# Patient Record
Sex: Female | Born: 1945
Health system: Southern US, Community
[De-identification: ages and names within clinical notes are randomized; demographics above are authoritative.]

## PROBLEM LIST (undated history)

## (undated) DIAGNOSIS — G4730 Sleep apnea, unspecified: Secondary | ICD-10-CM

## (undated) DIAGNOSIS — I10 Essential (primary) hypertension: Secondary | ICD-10-CM

## (undated) DIAGNOSIS — M722 Plantar fascial fibromatosis: Secondary | ICD-10-CM

## (undated) DIAGNOSIS — N390 Urinary tract infection, site not specified: Secondary | ICD-10-CM

## (undated) DIAGNOSIS — K219 Gastro-esophageal reflux disease without esophagitis: Secondary | ICD-10-CM

## (undated) DIAGNOSIS — E785 Hyperlipidemia, unspecified: Secondary | ICD-10-CM

## (undated) HISTORY — DX: Hyperlipidemia, unspecified: E78.5

## (undated) HISTORY — PX: ABDOMINAL HYSTERECTOMY: SHX81

## (undated) HISTORY — DX: Urinary tract infection, site not specified: N39.0

## (undated) HISTORY — DX: Sleep apnea, unspecified: G47.30

## (undated) HISTORY — DX: Plantar fascial fibromatosis: M72.2

---

## 2003-12-25 HISTORY — PX: HAND SURGERY: SHX662

## 2004-12-15 ENCOUNTER — Ambulatory Visit: Payer: Self-pay | Admitting: Unknown Physician Specialty

## 2005-01-22 ENCOUNTER — Ambulatory Visit: Payer: Self-pay | Admitting: Family Medicine

## 2005-10-28 ENCOUNTER — Emergency Department: Payer: Self-pay | Admitting: General Practice

## 2005-12-02 ENCOUNTER — Ambulatory Visit: Payer: Self-pay | Admitting: Internal Medicine

## 2006-03-28 ENCOUNTER — Ambulatory Visit: Payer: Self-pay | Admitting: Internal Medicine

## 2006-04-01 ENCOUNTER — Ambulatory Visit: Payer: Self-pay | Admitting: Internal Medicine

## 2006-12-02 ENCOUNTER — Ambulatory Visit: Payer: Self-pay | Admitting: Internal Medicine

## 2007-09-18 ENCOUNTER — Other Ambulatory Visit: Payer: Self-pay

## 2007-09-18 ENCOUNTER — Emergency Department: Payer: Self-pay | Admitting: Emergency Medicine

## 2007-09-26 ENCOUNTER — Ambulatory Visit: Payer: Self-pay | Admitting: Internal Medicine

## 2007-10-27 ENCOUNTER — Ambulatory Visit: Payer: Self-pay | Admitting: Internal Medicine

## 2007-12-22 ENCOUNTER — Ambulatory Visit: Payer: Self-pay | Admitting: Internal Medicine

## 2008-01-06 ENCOUNTER — Ambulatory Visit: Payer: Self-pay | Admitting: Internal Medicine

## 2008-11-22 ENCOUNTER — Ambulatory Visit: Payer: Self-pay | Admitting: Internal Medicine

## 2008-11-29 ENCOUNTER — Ambulatory Visit: Payer: Self-pay | Admitting: Internal Medicine

## 2008-12-13 ENCOUNTER — Ambulatory Visit: Payer: Self-pay | Admitting: Internal Medicine

## 2009-01-10 ENCOUNTER — Ambulatory Visit: Payer: Self-pay | Admitting: Unknown Physician Specialty

## 2009-03-07 ENCOUNTER — Ambulatory Visit: Payer: Self-pay | Admitting: Urology

## 2009-09-05 ENCOUNTER — Ambulatory Visit: Payer: Self-pay | Admitting: Urology

## 2009-11-28 ENCOUNTER — Ambulatory Visit: Payer: Self-pay | Admitting: Internal Medicine

## 2010-05-15 ENCOUNTER — Ambulatory Visit: Payer: Self-pay | Admitting: Urology

## 2010-06-08 ENCOUNTER — Ambulatory Visit: Payer: Self-pay | Admitting: Urology

## 2010-07-03 ENCOUNTER — Ambulatory Visit: Payer: Self-pay | Admitting: Urology

## 2010-12-21 ENCOUNTER — Ambulatory Visit: Payer: Self-pay | Admitting: Internal Medicine

## 2011-02-05 ENCOUNTER — Ambulatory Visit: Payer: Self-pay | Admitting: Urology

## 2011-04-27 ENCOUNTER — Emergency Department: Payer: Self-pay | Admitting: Emergency Medicine

## 2012-01-21 ENCOUNTER — Ambulatory Visit: Payer: Self-pay | Admitting: Internal Medicine

## 2012-01-23 ENCOUNTER — Ambulatory Visit: Payer: Self-pay | Admitting: Internal Medicine

## 2012-08-18 ENCOUNTER — Ambulatory Visit: Payer: Self-pay | Admitting: Urology

## 2012-08-18 DIAGNOSIS — N2889 Other specified disorders of kidney and ureter: Secondary | ICD-10-CM | POA: Insufficient documentation

## 2012-08-18 DIAGNOSIS — G609 Hereditary and idiopathic neuropathy, unspecified: Secondary | ICD-10-CM | POA: Insufficient documentation

## 2013-01-26 ENCOUNTER — Ambulatory Visit: Payer: Self-pay | Admitting: Internal Medicine

## 2013-08-31 DIAGNOSIS — N281 Cyst of kidney, acquired: Secondary | ICD-10-CM | POA: Insufficient documentation

## 2013-11-01 ENCOUNTER — Emergency Department: Payer: Self-pay | Admitting: Emergency Medicine

## 2014-02-08 ENCOUNTER — Ambulatory Visit: Payer: Self-pay | Admitting: Internal Medicine

## 2014-07-15 ENCOUNTER — Emergency Department: Payer: Self-pay | Admitting: Emergency Medicine

## 2014-08-23 ENCOUNTER — Ambulatory Visit: Payer: Self-pay | Admitting: Internal Medicine

## 2014-09-20 DIAGNOSIS — N819 Female genital prolapse, unspecified: Secondary | ICD-10-CM | POA: Insufficient documentation

## 2014-09-20 DIAGNOSIS — N952 Postmenopausal atrophic vaginitis: Secondary | ICD-10-CM | POA: Insufficient documentation

## 2014-10-04 ENCOUNTER — Emergency Department: Payer: Self-pay | Admitting: Emergency Medicine

## 2014-12-06 ENCOUNTER — Ambulatory Visit: Payer: Self-pay | Admitting: Physician Assistant

## 2015-12-12 ENCOUNTER — Encounter
Admission: RE | Disposition: A | Discharge status: Home / Self Care | Payer: Self-pay | Source: Ambulatory Visit | Attending: Unknown Physician Specialty

## 2015-12-12 ENCOUNTER — Ambulatory Visit
Admission: RE | Admit: 2015-12-12 | Discharge: 2015-12-12 | Disposition: A | Discharge status: Home / Self Care | Payer: Commercial Managed Care - HMO | Source: Ambulatory Visit | Attending: Unknown Physician Specialty | Admitting: Unknown Physician Specialty

## 2015-12-12 ENCOUNTER — Ambulatory Visit: Payer: Commercial Managed Care - HMO | Admitting: Anesthesiology

## 2015-12-12 ENCOUNTER — Encounter: Payer: Self-pay | Admitting: *Deleted

## 2015-12-12 DIAGNOSIS — K640 First degree hemorrhoids: Secondary | ICD-10-CM | POA: Diagnosis not present

## 2015-12-12 DIAGNOSIS — Z1211 Encounter for screening for malignant neoplasm of colon: Secondary | ICD-10-CM | POA: Diagnosis present

## 2015-12-12 DIAGNOSIS — K219 Gastro-esophageal reflux disease without esophagitis: Secondary | ICD-10-CM | POA: Insufficient documentation

## 2015-12-12 DIAGNOSIS — Z79899 Other long term (current) drug therapy: Secondary | ICD-10-CM | POA: Diagnosis not present

## 2015-12-12 DIAGNOSIS — Z7982 Long term (current) use of aspirin: Secondary | ICD-10-CM | POA: Diagnosis not present

## 2015-12-12 DIAGNOSIS — Z800 Family history of malignant neoplasm of digestive organs: Secondary | ICD-10-CM | POA: Insufficient documentation

## 2015-12-12 DIAGNOSIS — I10 Essential (primary) hypertension: Secondary | ICD-10-CM | POA: Diagnosis not present

## 2015-12-12 HISTORY — DX: Essential (primary) hypertension: I10

## 2015-12-12 HISTORY — PX: COLONOSCOPY WITH PROPOFOL: SHX5780

## 2015-12-12 HISTORY — DX: Gastro-esophageal reflux disease without esophagitis: K21.9

## 2015-12-12 SURGERY — COLONOSCOPY WITH PROPOFOL
Anesthesia: General

## 2015-12-12 MED ORDER — PROPOFOL 10 MG/ML IV BOLUS
INTRAVENOUS | Status: DC | PRN
  Administered 2015-12-12: 30 mg via INTRAVENOUS

## 2015-12-12 MED ORDER — FENTANYL CITRATE (PF) 100 MCG/2ML IJ SOLN
INTRAMUSCULAR | Status: DC | PRN
  Administered 2015-12-12: 50 ug via INTRAVENOUS

## 2015-12-12 MED ORDER — SODIUM CHLORIDE 0.9 % IV SOLN: INTRAVENOUS | Status: DC

## 2015-12-12 MED ORDER — PROPOFOL 500 MG/50ML IV EMUL
INTRAVENOUS | Status: DC | PRN
  Administered 2015-12-12: 80 ug/kg/min via INTRAVENOUS

## 2015-12-12 MED ORDER — LIDOCAINE HCL (CARDIAC) 20 MG/ML IV SOLN
INTRAVENOUS | Status: DC | PRN
  Administered 2015-12-12: 50 mg via INTRAVENOUS

## 2015-12-12 MED ORDER — SODIUM CHLORIDE 0.9 % IV SOLN
INTRAVENOUS | Status: DC
  Administered 2015-12-12 (×2): via INTRAVENOUS

## 2015-12-12 MED ORDER — MIDAZOLAM HCL 2 MG/2ML IJ SOLN
INTRAMUSCULAR | Status: DC | PRN
  Administered 2015-12-12: 1 mg via INTRAVENOUS

## 2015-12-12 NOTE — Op Note (Signed)
[facility] Gastroenterology Patient Name: Cynthia Keith Procedure Date: 12/12/2015 9:50 AM MRN: [id number] Account #: [id number] Date of Birth: February 17, 1946 Admit Type: Outpatient Age: 69 Room: [facility] ENDO ROOM 1 Gender: Female Note Status: Finalized Procedure:         Colonoscopy Indications:       Screening in patient at increased risk: Family history of                     1st-degree relative with colorectal cancer Providers:         Manya Silvas, MD Referring MD:      Leighton Ruff. Turner, RN (Referring MD) Medicines:         Propofol per Anesthesia Complications:     No immediate complications. Procedure:         Pre-Anesthesia Assessment:                    - After reviewing the risks and benefits, the patient was                     deemed in satisfactory condition to undergo the procedure.                    After obtaining informed consent, the colonoscope was                     passed under direct vision. Throughout the procedure, the                     patient's blood pressure, pulse, and oxygen saturations                     were monitored continuously. The Colonoscope was                     introduced through the anus and advanced to the the cecum,                     identified by appendiceal orifice and ileocecal valve. The                     colonoscopy was performed without difficulty. The patient                     tolerated the procedure well. The quality of the bowel                     preparation was excellent. Findings:      Internal hemorrhoids were found during endoscopy. The hemorrhoids were       small and Grade I (internal hemorrhoids that do not prolapse).      The exam was otherwise without abnormality. Impression:        - Internal hemorrhoids.                    - The examination was otherwise normal.                    - No specimens collected. Recommendation:    - Repeat colonoscopy in 5 years for screening purposes. Manya Silvas, MD 12/12/2015 10:09:51 AM This report has been signed electronically. Number of Addenda: 0 Note Initiated On: 12/12/2015 9:50 AM Scope Withdrawal Time: 0 hours 7 minutes 21 seconds  Total Procedure Duration: 0 hours  13 minutes 5 seconds       [facility]

## 2015-12-12 NOTE — H&P (Signed)
   Primary Care Physician:  Christie Nottingham., PA Primary Gastroenterologist:  Dr. Vira Agar  Pre-Procedure History & Physical: HPI:  Cynthia Keith is a 69 y.o. female is here for an colonoscopy.   Past Medical History  Diagnosis Date  . GERD (gastroesophageal reflux disease)   . Hypertension     History reviewed. No pertinent past surgical history.  Prior to Admission medications   Medication Sig Start Date End Date Taking? Authorizing Provider  alendronate (FOSAMAX) 70 MG tablet Take 70 mg by mouth once a week. Take with a full glass of water on an empty stomach.   Yes Historical Provider, MD  amLODipine (NORVASC) 5 MG tablet Take 5 mg by mouth daily.   Yes Historical Provider, MD  aspirin EC 81 MG tablet Take 81 mg by mouth daily.   Yes Historical Provider, MD  Multiple Vitamin (MULTIVITAMIN) tablet Take 1 tablet by mouth daily.   Yes Historical Provider, MD  omeprazole (PRILOSEC) 20 MG capsule Take 20 mg by mouth daily.   Yes Historical Provider, MD    Allergies as of 11/15/2015  . (Not on File)    History reviewed. No pertinent family history.  Social History   Social History  . Marital Status: Married    Spouse Name: N/A  . Number of Children: N/A  . Years of Education: N/A   Occupational History  . Not on file.   Social History Main Topics  . Smoking status: Never Smoker   . Smokeless tobacco: Never Used  . Alcohol Use: Not on file  . Drug Use: No  . Sexual Activity: Not on file   Other Topics Concern  . Not on file   Social History Narrative  . No narrative on file    Review of Systems: See HPI, otherwise negative ROS  Physical Exam: BP 124/64 mmHg  Pulse 58  Temp(Src) 97.1 F (36.2 C) (Tympanic)  Resp 18  Ht 4\' 9"  (1.448 m)  Wt 58.968 kg (130 lb)  BMI 28.12 kg/m2  SpO2 100% General:   Alert,  pleasant and cooperative in NAD Head:  Normocephalic and atraumatic. Neck:  Supple; no masses or thyromegaly. Lungs:  Clear throughout to  auscultation.    Heart:  Regular rate and rhythm. Abdomen:  Soft, nontender and nondistended. Normal bowel sounds, without guarding, and without rebound.   Neurologic:  Alert and  oriented x4;  grossly normal neurologically.  Impression/Plan: Cynthia Keith is here for an colonoscopy to be performed for screening, FH colon cancer in brother.  Risks, benefits, limitations, and alternatives regarding  colonoscopy have been reviewed with the patient.  Questions have been answered.  All parties agreeable.   Gaylyn Cheers, MD  12/12/2015, 9:43 AM

## 2015-12-12 NOTE — Anesthesia Preprocedure Evaluation (Signed)
Anesthesia Evaluation  Patient identified by MRN, date of birth, ID band Patient awake    Reviewed: Allergy & Precautions, H&P , NPO status , Patient's Chart, lab work & pertinent test results  History of Anesthesia Complications Negative for: history of anesthetic complications  Airway Mallampati: III  TM Distance: >3 FB Neck ROM: limited    Dental no notable dental hx. (+) Teeth Intact   Pulmonary neg pulmonary ROS, neg shortness of breath,    Pulmonary exam normal breath sounds clear to auscultation       Cardiovascular Exercise Tolerance: Good hypertension, (-) angina(-) Past MI and (-) PND Normal cardiovascular exam Rhythm:regular Rate:Normal     Neuro/Psych negative neurological ROS  negative psych ROS   GI/Hepatic Neg liver ROS, GERD  Controlled,  Endo/Other  negative endocrine ROS  Renal/GU negative Renal ROS  negative genitourinary   Musculoskeletal   Abdominal   Peds  Hematology negative hematology ROS (+)   Anesthesia Other Findings Past Medical History:   GERD (gastroesophageal reflux disease)                       Hypertension                                                History reviewed. No pertinent surgical history.  BMI    Body Mass Index   28.12 kg/m 2      Reproductive/Obstetrics negative OB ROS                             Anesthesia Physical Anesthesia Plan  ASA: III  Anesthesia Plan: General   Post-op Pain Management:    Induction:   Airway Management Planned:   Additional Equipment:   Intra-op Plan:   Post-operative Plan:   Informed Consent: I have reviewed the patients History and Physical, chart, labs and discussed the procedure including the risks, benefits and alternatives for the proposed anesthesia with the patient or authorized representative who has indicated his/her understanding and acceptance.   Dental Advisory Given  Plan  Discussed with: Anesthesiologist, CRNA and Surgeon  Anesthesia Plan Comments:         Anesthesia Quick Evaluation

## 2015-12-12 NOTE — Transfer of Care (Signed)
Immediate Anesthesia Transfer of Care Note  Patient: Cynthia Keith  Procedure(s) Performed: Procedure(s): COLONOSCOPY WITH PROPOFOL (N/A)  Patient Location: PACU  Anesthesia Type:General  Level of Consciousness: awake, alert  and oriented  Airway & Oxygen Therapy: Patient Spontanous Breathing and Patient connected to nasal cannula oxygen  Post-op Assessment: Report given to RN and Post -op Vital signs reviewed and stable  Post vital signs: Reviewed and stable  Last Vitals:  Filed Vitals:   12/12/15 0823  BP: 124/64  Pulse: 58  Temp: 36.2 C  Resp: 18    Complications: No apparent anesthesia complications

## 2015-12-12 NOTE — Anesthesia Postprocedure Evaluation (Signed)
Anesthesia Post Note  Patient: Cynthia Keith  Procedure(s) Performed: Procedure(s) (LRB): COLONOSCOPY WITH PROPOFOL (N/A)  Patient location during evaluation: Endoscopy Anesthesia Type: General Level of consciousness: awake and alert Pain management: pain level controlled Vital Signs Assessment: post-procedure vital signs reviewed and stable Respiratory status: spontaneous breathing, nonlabored ventilation, respiratory function stable and patient connected to nasal cannula oxygen Cardiovascular status: blood pressure returned to baseline and stable Postop Assessment: no signs of nausea or vomiting Anesthetic complications: no    Last Vitals:  Filed Vitals:   12/12/15 1032 12/12/15 1042  BP: 104/44 136/69  Pulse: 47 48  Temp:    Resp: 13 16    Last Pain: There were no vitals filed for this visit.               Precious Haws Tyan Dy

## 2015-12-15 ENCOUNTER — Encounter: Payer: Self-pay | Admitting: Unknown Physician Specialty

## 2016-02-13 ENCOUNTER — Other Ambulatory Visit: Payer: Self-pay | Admitting: Physician Assistant

## 2016-02-13 DIAGNOSIS — Z1231 Encounter for screening mammogram for malignant neoplasm of breast: Secondary | ICD-10-CM

## 2016-02-21 DIAGNOSIS — N816 Rectocele: Secondary | ICD-10-CM | POA: Insufficient documentation

## 2016-02-27 ENCOUNTER — Ambulatory Visit: Payer: Commercial Managed Care - HMO | Attending: Physician Assistant

## 2016-03-05 ENCOUNTER — Other Ambulatory Visit: Payer: Self-pay | Admitting: Physician Assistant

## 2016-03-05 ENCOUNTER — Ambulatory Visit
Admission: RE | Admit: 2016-03-05 | Discharge: 2016-03-05 | Disposition: A | Discharge status: Home / Self Care | Payer: Commercial Managed Care - HMO | Source: Ambulatory Visit | Attending: Physician Assistant | Admitting: Physician Assistant

## 2016-03-05 DIAGNOSIS — Z1231 Encounter for screening mammogram for malignant neoplasm of breast: Secondary | ICD-10-CM | POA: Insufficient documentation

## 2016-03-15 ENCOUNTER — Other Ambulatory Visit: Payer: Self-pay | Admitting: Physician Assistant

## 2016-03-15 DIAGNOSIS — N63 Unspecified lump in unspecified breast: Secondary | ICD-10-CM

## 2016-04-09 ENCOUNTER — Ambulatory Visit: Payer: Commercial Managed Care - HMO

## 2016-04-09 ENCOUNTER — Ambulatory Visit
Admission: RE | Admit: 2016-04-09 | Discharge: 2016-04-09 | Disposition: A | Discharge status: Home / Self Care | Payer: Commercial Managed Care - HMO | Source: Ambulatory Visit | Attending: Physician Assistant | Admitting: Physician Assistant

## 2016-04-09 ENCOUNTER — Other Ambulatory Visit: Payer: Commercial Managed Care - HMO

## 2016-04-09 DIAGNOSIS — N63 Unspecified lump in unspecified breast: Secondary | ICD-10-CM

## 2016-05-06 ENCOUNTER — Emergency Department
Admission: EM | Admit: 2016-05-06 | Discharge: 2016-05-06 | Disposition: A | Discharge status: Home / Self Care | Payer: Commercial Managed Care - HMO | Attending: Emergency Medicine | Admitting: Emergency Medicine

## 2016-05-06 ENCOUNTER — Emergency Department: Payer: Commercial Managed Care - HMO

## 2016-05-06 ENCOUNTER — Encounter: Payer: Self-pay | Admitting: Urgent Care

## 2016-05-06 DIAGNOSIS — Y999 Unspecified external cause status: Secondary | ICD-10-CM | POA: Diagnosis not present

## 2016-05-06 DIAGNOSIS — Y939 Activity, unspecified: Secondary | ICD-10-CM | POA: Diagnosis not present

## 2016-05-06 DIAGNOSIS — S39012A Strain of muscle, fascia and tendon of lower back, initial encounter: Secondary | ICD-10-CM | POA: Insufficient documentation

## 2016-05-06 DIAGNOSIS — Y929 Unspecified place or not applicable: Secondary | ICD-10-CM | POA: Insufficient documentation

## 2016-05-06 DIAGNOSIS — X58XXXA Exposure to other specified factors, initial encounter: Secondary | ICD-10-CM | POA: Insufficient documentation

## 2016-05-06 DIAGNOSIS — Z79899 Other long term (current) drug therapy: Secondary | ICD-10-CM | POA: Insufficient documentation

## 2016-05-06 DIAGNOSIS — M25551 Pain in right hip: Secondary | ICD-10-CM | POA: Diagnosis present

## 2016-05-06 DIAGNOSIS — I10 Essential (primary) hypertension: Secondary | ICD-10-CM | POA: Diagnosis not present

## 2016-05-06 DIAGNOSIS — M5431 Sciatica, right side: Secondary | ICD-10-CM

## 2016-05-06 MED ORDER — PREDNISONE 20 MG PO TABS
40.0000 mg | ORAL_TABLET | Freq: Once | ORAL | Status: AC
  Administered 2016-05-06: 40 mg via ORAL
  Filled 2016-05-06: qty 2

## 2016-05-06 MED ORDER — PREDNISONE 20 MG PO TABS: 40.0000 mg | ORAL_TABLET | Freq: Every day | ORAL | Status: DC

## 2016-05-06 MED ORDER — TRAMADOL HCL 50 MG PO TABS: 50.0000 mg | ORAL_TABLET | Freq: Four times a day (QID) | ORAL | Status: DC | PRN

## 2016-05-06 MED ORDER — TRAMADOL HCL 50 MG PO TABS
100.0000 mg | ORAL_TABLET | Freq: Once | ORAL | Status: AC
  Administered 2016-05-06: 100 mg via ORAL
  Filled 2016-05-06: qty 2

## 2016-05-06 NOTE — ED Notes (Signed)
Pt reports mass was found on right breast last month. Mass has not been biopsied.

## 2016-05-06 NOTE — ED Notes (Signed)
Patient presents with c/o RIGHT hip pain. Patient has been using essential oils on her hip since it began hurting yesterday. Denies injury.

## 2016-05-06 NOTE — Discharge Instructions (Signed)

## 2016-05-06 NOTE — ED Notes (Signed)
MD Paduchowski at bedside  

## 2016-05-06 NOTE — ED Notes (Signed)
Pt reports acute right lower back/hip, radiating down right leg to toes, and across to lower left back. Pain in left back is described as numbness. Pain is sharp/stabbing shooting in right back/leg. Pt has tenderness in lower lumbar/sacral spine. Pt denies tenderness in right hip. Pt has hx of arthritis in lower back. 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   Pt reports his of mass on right kidney. Mass was originally 8 cm, currently down to 1 cm.

## 2016-05-06 NOTE — ED Provider Notes (Signed)
[facility] Emergency Department Provider Note  Time seen: 2:52 AM  I have reviewed the triage vital signs and the nursing notes.   HISTORY  Chief Complaint Hip Pain    HPI Cynthia Keith is a 70 y.o. female with a past medical history of gastric reflux and hypertension presents the emergency department with lower back pain. According to the patient since early this morning (approximately 20 hours ago) the patient has been experiencing lower back pain radiating down her right leg. States it is more in the lower back but then goes into the right hip and down the right leg. Patient states she has had sciatica in the past. Does not recall any inciting event. Denies any trauma. Describes the pain as moderate, unrelieved by Tylenol at home so she came to the emergency department.     Past Medical History  Diagnosis Date  . GERD (gastroesophageal reflux disease)   . Hypertension     There are no active problems to display for this patient.   Past Surgical History  Procedure Laterality Date  . Colonoscopy with propofol N/A 12/12/2015    Procedure: COLONOSCOPY WITH PROPOFOL;  Surgeon: Manya Silvas, MD;  Location: [facility] ENDOSCOPY;  Service: Endoscopy;  Laterality: N/A;    Current Outpatient Rx  Name  Route  Sig  Dispense  Refill  . alendronate (FOSAMAX) 70 MG tablet   Oral   Take 70 mg by mouth once a week. Take with a full glass of water on an empty stomach.         Marland Kitchen amLODipine (NORVASC) 5 MG tablet   Oral   Take 5 mg by mouth daily.         Marland Kitchen aspirin EC 81 MG tablet   Oral   Take 81 mg by mouth daily.         . Multiple Vitamin (MULTIVITAMIN) tablet   Oral   Take 1 tablet by mouth daily.         Marland Kitchen omeprazole (PRILOSEC) 20 MG capsule   Oral   Take 20 mg by mouth daily.           Allergies Iodine  Family History  Problem Relation Age of Onset  . Breast cancer Cousin     2 maternal cousins    Social History Social History   Substance Use Topics  . Smoking status: Never Smoker   . Smokeless tobacco: Never Used  . Alcohol Use: Yes    Review of Systems Constitutional: Negative for fever. Cardiovascular: Negative for chest pain. Respiratory: Negative for shortness of breath. Gastrointestinal: Negative for abdominal pain Musculoskeletal: Lower back pain radiating down her right leg. Neurological: Negative for headache 10-point ROS otherwise negative.  ____________________________________________   PHYSICAL EXAM:  VITAL SIGNS: ED Triage Vitals  Enc Vitals Group     BP 05/06/16 0210 130/67 mmHg     Pulse Rate 05/06/16 0210 69     Resp 05/06/16 0210 18     Temp 05/06/16 0210 97.8 F (36.6 C)     Temp Source 05/06/16 0210 Oral     SpO2 05/06/16 0210 100 %     Weight 05/06/16 0210 130 lb (58.968 kg)     Height 05/06/16 0210 4' 9.5" (1.461 m)     Head Cir --      Peak Flow --      Pain Score 05/06/16 0211 8     Pain Loc --      Pain Edu? --  Excl. in [place]? --     Constitutional: Alert and oriented. Well appearing and in no distress. Eyes: Normal exam ENT   Head: Normocephalic and atraumatic.   Mouth/Throat: Mucous membranes are moist. Cardiovascular: Normal rate, regular rhythm. No murmur Respiratory: Normal respiratory effort without tachypnea nor retractions. Breath sounds are clear  Gastrointestinal: Soft and nontender. No distention.   Musculoskeletal: Moderate tenderness over the right sacroiliac joint, no hip tenderness, good range of motion in bilateral hips without tenderness. 2+ DP pulse. Neurovascular intact. Neurologic:  Normal speech and language. No gross focal neurologic deficits  Skin:  Skin is warm, dry and intact.  Psychiatric: Mood and affect are normal.   ____________________________________________   RADIOLOGY  X-rays negative  ____________________________________________    INITIAL IMPRESSION / ASSESSMENT AND PLAN / ED COURSE  Pertinent labs & imaging  results that were available during my care of the patient were reviewed by me and considered in my medical decision making (see chart for details).  Patient with symptoms most suggestive of sciatica. We will dose pain medication and check x-rays. Patient agreeable to plan.  X-rays are negative. Suspect lumbosacral strain. We'll discharge with Ultram and PCP follow-up. Patient agreeable to plan. I discussed my normal lower back pain return precautions with the patient.  ____________________________________________   FINAL CLINICAL IMPRESSION(S) / ED DIAGNOSES  Lumbosacral strain   Harvest Dark, MD 05/06/16 [phone]

## 2016-05-06 NOTE — ED Notes (Signed)
  Reviewed d/c instructions, follow-up care, and prescriptions with pt. Pt verbalized understanding 

## 2016-06-13 DIAGNOSIS — R350 Frequency of micturition: Secondary | ICD-10-CM | POA: Insufficient documentation

## 2016-06-13 DIAGNOSIS — R300 Dysuria: Secondary | ICD-10-CM | POA: Insufficient documentation

## 2016-06-19 ENCOUNTER — Other Ambulatory Visit: Payer: Self-pay | Admitting: Physician Assistant

## 2016-06-19 DIAGNOSIS — N63 Unspecified lump in unspecified breast: Secondary | ICD-10-CM

## 2016-10-10 ENCOUNTER — Encounter: Payer: Self-pay | Admitting: *Deleted

## 2016-10-10 ENCOUNTER — Ambulatory Visit (INDEPENDENT_AMBULATORY_CARE_PROVIDER_SITE_OTHER): Payer: Commercial Managed Care - HMO | Admitting: Podiatry

## 2016-10-10 ENCOUNTER — Encounter: Payer: Self-pay | Admitting: Podiatry

## 2016-10-10 ENCOUNTER — Other Ambulatory Visit: Payer: Self-pay | Admitting: *Deleted

## 2016-10-10 DIAGNOSIS — L02619 Cutaneous abscess of unspecified foot: Secondary | ICD-10-CM | POA: Diagnosis not present

## 2016-10-10 DIAGNOSIS — L03119 Cellulitis of unspecified part of limb: Secondary | ICD-10-CM | POA: Diagnosis not present

## 2016-10-10 DIAGNOSIS — Q828 Other specified congenital malformations of skin: Secondary | ICD-10-CM | POA: Diagnosis not present

## 2016-10-10 DIAGNOSIS — M2041 Other hammer toe(s) (acquired), right foot: Secondary | ICD-10-CM | POA: Diagnosis not present

## 2016-10-10 MED ORDER — CEPHALEXIN 500 MG PO CAPS: 500.0000 mg | ORAL_CAPSULE | Freq: Three times a day (TID) | ORAL | 0 refills | Status: DC

## 2016-10-10 NOTE — Progress Notes (Signed)
   Subjective:    Patient ID: Cynthia Keith, female    DOB: 08-19-1946, 70 y.o.   MRN: [id number]  HPI : She presents today with a chief complaint of a painful sore to the medial aspect of the fifth digit of the right foot. She states has been there for several months now she's been utilizing essentially rolls and corn pads to alleviate her symptoms. She states that now seems to be getting worse and is exquisitely tender.    Review of Systems  Skin: Positive for rash.  All other systems reviewed and are negative.      Objective:   Physical Exam: Vital signs are stable she is alert and oriented 3. Pulses are palpable. Neurologic sensorium is intact. Deep tendon reflexes are intact. Muscle strength +5 over 5 dorsiflexion plantar flexors and inverters everters all of his musculature is intact. Orthopedic evaluation of his wrist on dorsiflexion full range of motion with crepitus hammertoe deformities are noted. Adductor varus rotated hammertoe deformity fifth right resulting inject the position and reactive hyperkeratosis to the medial aspect of the PIPJ fifth digit right foot with the use of corn pads and his intraoral she has broken down skin resulting in superficial ulceration which was debrided sharply today to bleeding. She did also has some cellulitis extending to the level of the metatarsophalangeal joint. Radiographs are not taken today due to failure of equipment.        Assessment & Plan:  Superficial ulceration secondary to hammertoe deformity and skin breakdown.  Plan: Resected all of the necrotic tissue today to bleeding applied silver nitrate and addressed a compressive dressing encouraged once a day soaking Epsom salts and warm water and application of a dry sterile dressing. I expressed to her that she should not use the salicylic acid pads for the essential oils until this has healed up. I dispensed a prescription for cephalexin 500 mg 1 by mouth 3 times a day. I will follow-up  with her in a couple weeks. She will watch for signs and symptoms of worsening infection such as fever chills nausea vomiting cellulitis extending to the midfoot. She is to notify the emergency department should any this arise.

## 2016-10-15 ENCOUNTER — Other Ambulatory Visit: Payer: Commercial Managed Care - HMO

## 2016-10-15 ENCOUNTER — Ambulatory Visit: Payer: Commercial Managed Care - HMO

## 2016-10-22 ENCOUNTER — Ambulatory Visit: Payer: Self-pay | Admitting: Podiatry

## 2016-10-29 ENCOUNTER — Ambulatory Visit
Admission: RE | Admit: 2016-10-29 | Discharge: 2016-10-29 | Disposition: A | Discharge status: Home / Self Care | Payer: Commercial Managed Care - HMO | Source: Ambulatory Visit | Attending: Physician Assistant | Admitting: Physician Assistant

## 2016-10-29 DIAGNOSIS — N63 Unspecified lump in unspecified breast: Secondary | ICD-10-CM | POA: Diagnosis not present

## 2017-01-03 DIAGNOSIS — J45991 Cough variant asthma: Secondary | ICD-10-CM | POA: Diagnosis not present

## 2017-01-03 DIAGNOSIS — I10 Essential (primary) hypertension: Secondary | ICD-10-CM | POA: Diagnosis not present

## 2017-01-09 ENCOUNTER — Ambulatory Visit: Payer: Commercial Managed Care - HMO | Admitting: Podiatry

## 2017-01-21 DIAGNOSIS — N816 Rectocele: Secondary | ICD-10-CM | POA: Diagnosis not present

## 2017-01-21 DIAGNOSIS — E782 Mixed hyperlipidemia: Secondary | ICD-10-CM | POA: Diagnosis not present

## 2017-01-21 DIAGNOSIS — E559 Vitamin D deficiency, unspecified: Secondary | ICD-10-CM | POA: Diagnosis not present

## 2017-01-21 DIAGNOSIS — Z0001 Encounter for general adult medical examination with abnormal findings: Secondary | ICD-10-CM | POA: Diagnosis not present

## 2017-01-28 ENCOUNTER — Encounter: Payer: Self-pay | Admitting: Podiatry

## 2017-01-28 ENCOUNTER — Ambulatory Visit (INDEPENDENT_AMBULATORY_CARE_PROVIDER_SITE_OTHER): Payer: Medicare HMO | Admitting: Podiatry

## 2017-01-28 DIAGNOSIS — Q828 Other specified congenital malformations of skin: Secondary | ICD-10-CM

## 2017-01-28 DIAGNOSIS — M2041 Other hammer toe(s) (acquired), right foot: Secondary | ICD-10-CM

## 2017-01-28 DIAGNOSIS — E782 Mixed hyperlipidemia: Secondary | ICD-10-CM | POA: Diagnosis not present

## 2017-01-28 DIAGNOSIS — N390 Urinary tract infection, site not specified: Secondary | ICD-10-CM | POA: Diagnosis not present

## 2017-01-28 DIAGNOSIS — K219 Gastro-esophageal reflux disease without esophagitis: Secondary | ICD-10-CM | POA: Diagnosis not present

## 2017-01-28 DIAGNOSIS — M810 Age-related osteoporosis without current pathological fracture: Secondary | ICD-10-CM | POA: Diagnosis not present

## 2017-01-28 DIAGNOSIS — I10 Essential (primary) hypertension: Secondary | ICD-10-CM | POA: Diagnosis not present

## 2017-01-29 NOTE — Progress Notes (Signed)
She presents today chief complaint of a painful lesion to the medial aspect of the fifth digit of the right foot.  Objective: Vital signs are stable alert and oriented 3. Pulses are palpable. Neurologic sensorium is intact. Dr. varus rotated hammertoe deformity fifth right with osteoarthritic changes have resulted and reactive hyperkeratoses medial aspect of the fifth toe right foot.  Assessment: Pain limp secondary to painful callus.  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 Follow up with her as needed

## 2017-01-30 ENCOUNTER — Other Ambulatory Visit: Payer: Self-pay | Admitting: Nurse Practitioner

## 2017-01-30 DIAGNOSIS — E2839 Other primary ovarian failure: Secondary | ICD-10-CM

## 2017-02-26 ENCOUNTER — Inpatient Hospital Stay
Admission: EM | Admit: 2017-02-26 | Discharge: 2017-02-28 | DRG: 558 | Disposition: A | Discharge status: Home / Self Care | Payer: Medicare HMO | Attending: Internal Medicine | Admitting: Internal Medicine

## 2017-02-26 ENCOUNTER — Encounter: Payer: Self-pay | Admitting: Emergency Medicine

## 2017-02-26 DIAGNOSIS — M79605 Pain in left leg: Secondary | ICD-10-CM | POA: Diagnosis not present

## 2017-02-26 DIAGNOSIS — Z90710 Acquired absence of both cervix and uterus: Secondary | ICD-10-CM

## 2017-02-26 DIAGNOSIS — Z7982 Long term (current) use of aspirin: Secondary | ICD-10-CM | POA: Diagnosis not present

## 2017-02-26 DIAGNOSIS — I10 Essential (primary) hypertension: Secondary | ICD-10-CM

## 2017-02-26 DIAGNOSIS — M62838 Other muscle spasm: Secondary | ICD-10-CM

## 2017-02-26 DIAGNOSIS — Z79899 Other long term (current) drug therapy: Secondary | ICD-10-CM

## 2017-02-26 DIAGNOSIS — R739 Hyperglycemia, unspecified: Secondary | ICD-10-CM | POA: Diagnosis not present

## 2017-02-26 DIAGNOSIS — D72829 Elevated white blood cell count, unspecified: Secondary | ICD-10-CM | POA: Diagnosis not present

## 2017-02-26 DIAGNOSIS — Z888 Allergy status to other drugs, medicaments and biological substances status: Secondary | ICD-10-CM | POA: Diagnosis not present

## 2017-02-26 DIAGNOSIS — K219 Gastro-esophageal reflux disease without esophagitis: Secondary | ICD-10-CM | POA: Diagnosis present

## 2017-02-26 DIAGNOSIS — Z803 Family history of malignant neoplasm of breast: Secondary | ICD-10-CM

## 2017-02-26 DIAGNOSIS — M6282 Rhabdomyolysis: Principal | ICD-10-CM | POA: Diagnosis present

## 2017-02-26 DIAGNOSIS — M79604 Pain in right leg: Secondary | ICD-10-CM

## 2017-02-26 DIAGNOSIS — I159 Secondary hypertension, unspecified: Secondary | ICD-10-CM

## 2017-02-26 HISTORY — DX: Essential (primary) hypertension: I10

## 2017-02-26 LAB — BASIC METABOLIC PANEL
Anion gap: 7 (ref 5–15)
BUN: 10 mg/dL (ref 6–20)
CHLORIDE: 103 mmol/L (ref 101–111)
CO2: 27 mmol/L (ref 22–32)
Calcium: 9.2 mg/dL (ref 8.9–10.3)
Creatinine, Ser: 0.42 mg/dL — ABNORMAL LOW (ref 0.44–1.00)
GFR calc Af Amer: >60 mL/min (ref >60)
GFR calc non Af Amer: >60 mL/min (ref >60)
GLUCOSE: 129 mg/dL — AB (ref 65–99)
POTASSIUM: 3.5 mmol/L (ref 3.5–5.1)
Sodium: 137 mmol/L (ref 135–145)

## 2017-02-26 LAB — URINE DRUG SCREEN, QUALITATIVE (ARMC ONLY)
Amphetamines, Ur Screen: NOT DETECTED
BARBITURATES, UR SCREEN: NOT DETECTED
BENZODIAZEPINE, UR SCRN: NOT DETECTED
COCAINE METABOLITE, UR ~~LOC~~: NOT DETECTED
Cannabinoid 50 Ng, Ur ~~LOC~~: NOT DETECTED
MDMA (Ecstasy)Ur Screen: NOT DETECTED
Methadone Scn, Ur: NOT DETECTED
OPIATE, UR SCREEN: NOT DETECTED
PHENCYCLIDINE (PCP) UR S: NOT DETECTED
Tricyclic, Ur Screen: NOT DETECTED

## 2017-02-26 LAB — CBC
HEMATOCRIT: 42.4 % (ref 35.0–47.0)
HEMOGLOBIN: 14.9 g/dL (ref 12.0–16.0)
MCH: 32.4 pg (ref 26.0–34.0)
MCHC: 35.2 g/dL (ref 32.0–36.0)
MCV: 92.2 fL (ref 80.0–100.0)
Platelets: 245 10*3/uL (ref 150–440)
RBC: 4.6 MIL/uL (ref 3.80–5.20)
RDW: 13.4 % (ref 11.5–14.5)
WBC: 11.3 10*3/uL — ABNORMAL HIGH (ref 3.6–11.0)

## 2017-02-26 LAB — TROPONIN I: Troponin I: <0.03 ng/mL (ref <0.03)

## 2017-02-26 LAB — CK: CK TOTAL: 22015 U/L — AB (ref 38–234)

## 2017-02-26 LAB — TSH: TSH: 1.387 u[IU]/mL (ref 0.350–4.500)

## 2017-02-26 MED ORDER — OXYCODONE-ACETAMINOPHEN 5-325 MG PO TABS
1.0000 | ORAL_TABLET | Freq: Once | ORAL | Status: AC
  Administered 2017-02-26: 1 via ORAL
  Filled 2017-02-26: qty 1

## 2017-02-26 MED ORDER — IBUPROFEN 200 MG PO TABS
200.0000 mg | ORAL_TABLET | Freq: Four times a day (QID) | ORAL | Status: DC | PRN
  Administered 2017-02-26 – 2017-02-28 (×2): 200 mg via ORAL
  Filled 2017-02-26 (×2): qty 1

## 2017-02-26 MED ORDER — FENTANYL CITRATE (PF) 100 MCG/2ML IJ SOLN
75.0000 ug | Freq: Once | INTRAMUSCULAR | Status: AC
  Administered 2017-02-26: 75 ug via INTRAVENOUS
  Filled 2017-02-26: qty 2

## 2017-02-26 MED ORDER — ACETAMINOPHEN 325 MG PO TABS
650.0000 mg | ORAL_TABLET | Freq: Once | ORAL | Status: AC
  Administered 2017-02-26: 650 mg via ORAL
  Filled 2017-02-26: qty 2

## 2017-02-26 MED ORDER — DIAZEPAM 2 MG PO TABS
2.0000 mg | ORAL_TABLET | Freq: Once | ORAL | Status: AC
  Administered 2017-02-26: 2 mg via ORAL
  Filled 2017-02-26: qty 1

## 2017-02-26 MED ORDER — ENOXAPARIN SODIUM 40 MG/0.4ML ~~LOC~~ SOLN
40.0000 mg | SUBCUTANEOUS | Status: DC
  Administered 2017-02-26 – 2017-02-27 (×2): 40 mg via SUBCUTANEOUS
  Filled 2017-02-26 (×2): qty 0.4

## 2017-02-26 MED ORDER — AMLODIPINE BESYLATE 5 MG PO TABS
5.0000 mg | ORAL_TABLET | Freq: Once | ORAL | Status: AC
  Administered 2017-02-26: 5 mg via ORAL
  Filled 2017-02-26: qty 1

## 2017-02-26 MED ORDER — AMLODIPINE BESYLATE 10 MG PO TABS
10.0000 mg | ORAL_TABLET | Freq: Every day | ORAL | Status: DC
  Administered 2017-02-27 – 2017-02-28 (×2): 10 mg via ORAL
  Filled 2017-02-26 (×2): qty 1

## 2017-02-26 MED ORDER — SODIUM CHLORIDE 0.45 % IV SOLN
INTRAVENOUS | Status: DC
  Administered 2017-02-26: 14:00:00 via INTRAVENOUS

## 2017-02-26 MED ORDER — CLONIDINE HCL 0.1 MG PO TABS: 0.1000 mg | ORAL_TABLET | Freq: Four times a day (QID) | ORAL | Status: DC | PRN

## 2017-02-26 MED ORDER — ONDANSETRON HCL 4 MG PO TABS
4.0000 mg | ORAL_TABLET | Freq: Four times a day (QID) | ORAL | Status: DC | PRN
  Administered 2017-02-26: 4 mg via ORAL
  Filled 2017-02-26: qty 1

## 2017-02-26 MED ORDER — AMLODIPINE BESYLATE 5 MG PO TABS
5.0000 mg | ORAL_TABLET | Freq: Every day | ORAL | Status: DC
  Administered 2017-02-26: 5 mg via ORAL
  Filled 2017-02-26: qty 1

## 2017-02-26 MED ORDER — ACETAMINOPHEN 325 MG PO TABS
650.0000 mg | ORAL_TABLET | Freq: Four times a day (QID) | ORAL | Status: DC | PRN
  Administered 2017-02-26 – 2017-02-27 (×4): 650 mg via ORAL
  Filled 2017-02-26 (×4): qty 2

## 2017-02-26 MED ORDER — SODIUM CHLORIDE 0.9 % IV BOLUS (SEPSIS)
1000.0000 mL | Freq: Once | INTRAVENOUS | Status: AC
  Administered 2017-02-26: 1000 mL via INTRAVENOUS

## 2017-02-26 MED ORDER — ONDANSETRON HCL 4 MG/2ML IJ SOLN: 4.0000 mg | Freq: Four times a day (QID) | INTRAMUSCULAR | Status: DC | PRN

## 2017-02-26 MED ORDER — CARISOPRODOL 350 MG PO TABS: 350.0000 mg | ORAL_TABLET | Freq: Three times a day (TID) | ORAL | 0 refills | Status: DC | PRN

## 2017-02-26 MED ORDER — ASPIRIN EC 81 MG PO TBEC
81.0000 mg | DELAYED_RELEASE_TABLET | Freq: Every day | ORAL | Status: DC
  Administered 2017-02-26 – 2017-02-28 (×3): 81 mg via ORAL
  Filled 2017-02-26 (×3): qty 1

## 2017-02-26 MED ORDER — KETOROLAC TROMETHAMINE 30 MG/ML IJ SOLN
30.0000 mg | Freq: Once | INTRAMUSCULAR | Status: AC
  Administered 2017-02-26: 30 mg via INTRAMUSCULAR

## 2017-02-26 MED ORDER — PANTOPRAZOLE SODIUM 40 MG PO TBEC
40.0000 mg | DELAYED_RELEASE_TABLET | Freq: Every day | ORAL | Status: DC
  Administered 2017-02-26 – 2017-02-28 (×3): 40 mg via ORAL
  Filled 2017-02-26 (×3): qty 1

## 2017-02-26 MED ORDER — ACETAMINOPHEN 650 MG RE SUPP: 650.0000 mg | Freq: Four times a day (QID) | RECTAL | Status: DC | PRN

## 2017-02-26 MED ORDER — HYDRALAZINE HCL 20 MG/ML IJ SOLN
10.0000 mg | Freq: Once | INTRAMUSCULAR | Status: AC
  Administered 2017-02-26: 10 mg via INTRAVENOUS
  Filled 2017-02-26: qty 1

## 2017-02-26 MED ORDER — DICLOFENAC SODIUM 3 % TD GEL: 1.0000 "application " | Freq: Two times a day (BID) | TRANSDERMAL | 0 refills | Status: DC | PRN

## 2017-02-26 MED ORDER — KETOROLAC TROMETHAMINE 30 MG/ML IJ SOLN
INTRAMUSCULAR | Status: AC
  Administered 2017-02-26: 30 mg via INTRAMUSCULAR
  Filled 2017-02-26: qty 1

## 2017-02-26 MED ORDER — SODIUM CHLORIDE 0.9% FLUSH
3.0000 mL | Freq: Two times a day (BID) | INTRAVENOUS | Status: DC
  Administered 2017-02-26 – 2017-02-27 (×2): 3 mL via INTRAVENOUS

## 2017-02-26 NOTE — ED Provider Notes (Signed)
This patient was singed out to me by Dr. Clearnce Hasten.  19y female with a new exercise regimen, including treadmill and stairmaster, presenting with bilateral thigh "burning" resulting in difficulty walking. The patient has received 2 mg of Valium and 30 mg of Toradol, as well as Percocet and reports only mild improvement in her pain. I have reexamined her, and she has full range of motion of the hips, knees and ankles, she is able to stand and walks slowly, but does continue to have some discomfort. We will get a CK, and check her creatinine to rule out rhabdomyolysis, and continue with aggressive pain management. Plan reevaluation for final disposition.    Eula Listen, MD 02/26/17 [phone]

## 2017-02-26 NOTE — Progress Notes (Signed)
This Nurse, Called back to receive report from ED RN, ED RN Joellen Jersey states that she will call Floor RN back when she is able.

## 2017-02-26 NOTE — ED Notes (Signed)
Pt moving legs around on the bed sating "I cannot move my legs"

## 2017-02-26 NOTE — Progress Notes (Signed)
Report called to Miguel Rota, RN.

## 2017-02-26 NOTE — ED Notes (Signed)
Report to katie, rn 

## 2017-02-26 NOTE — ED Notes (Signed)
Pt up to toilet in room with assistance

## 2017-02-26 NOTE — H&P (Signed)
[place] [facility] [place] NAME: Cynthia Keith    MR#:  [id number]  DATE OF BIRTH:  01-15-1946  DATE OF ADMISSION:  02/26/2017  PRIMARY CARE PHYSICIAN: Christie Nottingham., PA   REQUESTING/REFERRING PHYSICIAN:   CHIEF COMPLAINT:   Chief Complaint  Patient presents with  . Leg Pain    HISTORY OF PRESENT ILLNESS: Cynthia Keith  is a 71 y.o. female with a known history of Gastroesophageal reflux disease, hypertension, who presents to the hospital with complaints of bilateral thigh pain, burning sensation. However, new to the patient, she started exercising again a few days ago, she was on treadmill, stepper, then she has some stretching exercises. She exercises about about 45 minutes in total. Next day she started noticing pain in her thighs. On arrival to emergency room, she was noted to be in rhabdomyolysis with CK levels above 22,000, hospitalist services were contacted for admission. Patient's blood pressure was also elevated at 195. In emergency room.   PAST MEDICAL HISTORY:   Past Medical History:  Diagnosis Date  . GERD (gastroesophageal reflux disease)   . Hypertension     PAST SURGICAL HISTORY: Past Surgical History:  Procedure Laterality Date  . ABDOMINAL HYSTERECTOMY    . COLONOSCOPY WITH PROPOFOL N/A 12/12/2015   Procedure: COLONOSCOPY WITH PROPOFOL;  Surgeon: Manya Silvas, MD;  Location: [facility] ENDOSCOPY;  Service: Endoscopy;  Laterality: N/A;    SOCIAL HISTORY:  Social History  Substance Use Topics  . Smoking status: Never Smoker  . Smokeless tobacco: Never Used  . Alcohol use Yes    FAMILY HISTORY:  Family History  Problem Relation Age of Onset  . Breast cancer Cousin     2 maternal cousins    DRUG ALLERGIES:  Allergies  Allergen Reactions  . Iodine Shortness Of Breath    "Quit Breathing"    Review of Systems  Constitutional: Negative for chills, fever and weight loss.  HENT: Negative for congestion.    Eyes: Negative for blurred vision and double vision.  Respiratory: Negative for cough, sputum production, shortness of breath and wheezing.   Cardiovascular: Negative for chest pain, palpitations, orthopnea, leg swelling and PND.  Gastrointestinal: Negative for abdominal pain, blood in stool, constipation, diarrhea, nausea and vomiting.  Genitourinary: Negative for dysuria, frequency, hematuria and urgency.  Musculoskeletal: Positive for myalgias. Negative for falls.  Neurological: Negative for dizziness, tremors, focal weakness and headaches.  Endo/Heme/Allergies: Does not bruise/bleed easily.  Psychiatric/Behavioral: Negative for depression. The patient does not have insomnia.     MEDICATIONS AT HOME:  Prior to Admission medications   Medication Sig Start Date End Date Taking? Authorizing Provider  amLODipine (NORVASC) 5 MG tablet Take 5 mg by mouth daily.   Yes Historical Provider, MD  aspirin EC 81 MG tablet Take 81 mg by mouth daily.   Yes Historical Provider, MD  Multiple Vitamin (MULTIVITAMIN) tablet Take 1 tablet by mouth daily.   Yes Historical Provider, MD  omeprazole (PRILOSEC) 20 MG capsule Take 20 mg by mouth daily.   Yes Historical Provider, MD  carisoprodol (SOMA) 350 MG tablet Take 1 tablet (350 mg total) by mouth 3 (three) times daily as needed for muscle spasms. 02/26/17 02/26/18  Orbie Pyo, MD  Diclofenac Sodium 3 % GEL Place 1 application onto the skin every 12 (twelve) hours as needed. 02/26/17   Orbie Pyo, MD      PHYSICAL EXAMINATION:   VITAL SIGNS: Blood pressure (!) 160/74, pulse  83, temperature 97.8 F (36.6 C), temperature source Oral, resp. rate 20, height 4\' 9"  (1.448 m), weight 60.3 kg (133 lb), SpO2 100 %.  GENERAL:  71 y.o.-year-old patient lying in the bed with no acute distress.  EYES: Pupils equal, round, reactive to light and accommodation. No scleral icterus. Extraocular muscles intact.  HEENT: Head atraumatic, normocephalic.  Oropharynx and nasopharynx clear.  NECK:  Supple, no jugular venous distention. No thyroid enlargement, no tenderness.  LUNGS: Normal breath sounds bilaterally, no wheezing, rales,rhonchi or crepitation. No use of accessory muscles of respiration.  CARDIOVASCULAR: S1, S2 normal. No murmurs, rubs, or gallops.  ABDOMEN: Soft, nontender, nondistended. Bowel sounds present. No organomegaly or mass.  EXTREMITIES: No pedal edema, cyanosis, or clubbing. Significant tenderness on palpation of bilateral thighs,no swelling, no erythema of the skin NEUROLOGIC: Cranial nerves II through XII are intact. Muscle strength 5/5 in all extremities. Sensation intact. Gait not checked.  PSYCHIATRIC: The patient is alert and oriented x 3.  SKIN: No obvious rash, lesion, or ulcer.   LABORATORY PANEL:   CBC  Recent Labs Lab 02/26/17 0758  WBC 11.3*  HGB 14.9  HCT 42.4  PLT 245  MCV 92.2  MCH 32.4  MCHC 35.2  RDW 13.4   ------------------------------------------------------------------------------------------------------------------  Chemistries   Recent Labs Lab 02/26/17 0758  NA 137  K 3.5  CL 103  CO2 27  GLUCOSE 129*  BUN 10  CREATININE 0.42*  CALCIUM 9.2   ------------------------------------------------------------------------------------------------------------------  Cardiac Enzymes No results for input(s): TROPONINI in the last 168 hours. ------------------------------------------------------------------------------------------------------------------  RADIOLOGY: No results found.  EKG: Orders placed or performed in visit on 11/01/13  . EKG 12-Lead    IMPRESSION AND PLAN:  Active Problems:   Rhabdomyolysis   Malignant essential hypertension #1. Rhabdomyolysis due to excessive exercise, initiate patient on low rate IV fluids, follow CK levels and creatinine in the morning, get TSH #2. Malignant essential hypertension, continue outpatient medications, advance him as  needed, add clonidine when necessary #3. Leukocytosis, no infection was noted, follow labs in the morning #4. Hyperglycemia, get hemoglobin, A1c   All the records are reviewed and case discussed with ED provider. Management plans discussed with the patient, family and they are in agreement.  CODE STATUS: Code Status History    This patient does not have a recorded code status. Please follow your organizational policy for patients in this situation.       TOTAL TIME TAKING CARE OF THIS PATIENT: 50 minutes.    Theodoro Grist M.D on 02/26/2017 at 10:13 AM  Between 7am to 6pm - Pager - [phone] After 6pm go to [email or web address] - password [email or web address] [facility]  [facility]  Office  [phone]  CC: Primary care physician; Christie Nottingham., PA

## 2017-02-26 NOTE — ED Provider Notes (Signed)
[facility] Emergency Department Provider Note  ____________________________________________   First MD Initiated Contact with Patient 02/26/17 [phone]     (approximate)  I have reviewed the triage vital signs and the nursing notes.   HISTORY  Chief Complaint Leg Pain   HPI Cynthia Keith is a 71 y.o. female with a history of hypertension as well as GERD who is presenting to the emergency department with bilateral hip pain. She says that she started working out again yesterday after taking a break for several months. Said that she did the treadmill as well as a Metall[employer]. She said that afterwards she started feeling cramping in her legs. He said that she has tried essential oils for pain relief without any relief and said that she was unable sleep tonight which is what brought her into the emergency department. She describes the pain is constant and cramping to the bilateral thighs especially anteriorly and laterally, bilaterally. Report any swelling. She says that her urine is clear and is not T colored or brown.   Past Medical History:  Diagnosis Date  . GERD (gastroesophageal reflux disease)   . Hypertension     Patient Active Problem List   Diagnosis Date Noted  . Dysuria 06/13/2016  . Frequency of urination 06/13/2016  . Posterior vaginal wall prolapse 02/21/2016  . Vaginal vault prolapse 09/20/2014  . Vaginal atrophy 09/20/2014  . Acquired cyst of kidney 08/31/2013  . Hereditary and idiopathic peripheral neuropathy 08/18/2012  . Renal mass, right 08/18/2012    Past Surgical History:  Procedure Laterality Date  . ABDOMINAL HYSTERECTOMY    . COLONOSCOPY WITH PROPOFOL N/A 12/12/2015   Procedure: COLONOSCOPY WITH PROPOFOL;  Surgeon: Manya Silvas, MD;  Location: [facility] ENDOSCOPY;  Service: Endoscopy;  Laterality: N/A;    Prior to Admission medications   Medication Sig Start Date End Date Taking? Authorizing Provider  amLODipine (NORVASC) 5  MG tablet Take 5 mg by mouth daily.    Historical Provider, MD  aspirin EC 81 MG tablet Take 81 mg by mouth daily.    Historical Provider, MD  carisoprodol (SOMA) 350 MG tablet Take 1 tablet (350 mg total) by mouth 3 (three) times daily as needed for muscle spasms. 02/26/17 02/26/18  Orbie Pyo, MD  Diclofenac Sodium 3 % GEL Place 1 application onto the skin every 12 (twelve) hours as needed. 02/26/17   Orbie Pyo, MD  Multiple Vitamin (MULTIVITAMIN) tablet Take 1 tablet by mouth daily.    Historical Provider, MD  omeprazole (PRILOSEC) 20 MG capsule Take 20 mg by mouth daily.    Historical Provider, MD    Allergies Iodine  Family History  Problem Relation Age of Onset  . Breast cancer Cousin     2 maternal cousins    Social History Social History  Substance Use Topics  . Smoking status: Never Smoker  . Smokeless tobacco: Never Used  . Alcohol use Yes    Review of Systems Constitutional: No fever/chills Eyes: No visual changes. ENT: No sore throat. Cardiovascular: Denies chest pain. Respiratory: Denies shortness of breath. Gastrointestinal: No abdominal pain.  No nausea, no vomiting.  No diarrhea.  No constipation. Genitourinary: Negative for dysuria. Musculoskeletal: Negative for back pain. Skin: Negative for rash. Neurological: Negative for headaches, focal weakness or numbness.  10-point ROS otherwise negative.  ____________________________________________   PHYSICAL EXAM:  VITAL SIGNS: ED Triage Vitals [02/26/17 0441]  Enc Vitals Group     BP (!) 183/74  Pulse Rate 66     Resp 18     Temp 97.8 F (36.6 C)     Temp Source Oral     SpO2 100 %     Weight 133 lb (60.3 kg)     Height 4\' 9"  (1.448 m)     Head Circumference      Peak Flow      Pain Score 10     Pain Loc      Pain Edu?      Excl. in [place]?     Constitutional: Alert and oriented. Well appearing and in no acute distress. Eyes: Conjunctivae are normal. PERRL. EOMI. Head:  Atraumatic. Nose: No congestion/rhinnorhea. Mouth/Throat: Mucous membranes are moist.   Neck: No stridor.   Cardiovascular: Normal rate, regular rhythm. Grossly normal heart sounds.  Respiratory: Normal respiratory effort.  No retractions. Lungs CTAB. Gastrointestinal: Soft and nontender. No distention.  Musculoskeletal:No edema or swelling the bilateral lower extremities. Compartments are soft. Tenderness palpation over the quadriceps muscles as well as tenderness, which is mild-to-moderate, laterally to the bilateral thighs. Neurologic:  Normal speech and language. No gross focal neurologic deficits are appreciated.  Skin:  Skin is warm, dry and intact. No rash noted. Psychiatric: Mood and affect are normal. Speech and behavior are normal.  ____________________________________________   LABS (all labs ordered are listed, but only abnormal results are displayed)  Labs Reviewed - No data to display ____________________________________________  EKG   ____________________________________________  RADIOLOGY   ____________________________________________   PROCEDURES  Procedure(s) performed:   Procedures  Critical Care performed:   ____________________________________________   INITIAL IMPRESSION / ASSESSMENT AND PLAN / ED COURSE  Pertinent labs & imaging results that were available during my care of the patient were reviewed by me and considered in my medical decision making (see chart for details).  ----------------------------------------- 7:07 AM on 02/26/2017 -----------------------------------------  Patient still with cramping in her bilateral lower extremities after Valium and Toradol. She'll receive Percocet. Signed out to Dr. Mariea Clonts for reevaluation. Unlikely rhabdomyolysis. Compartments are soft. Patient says her urine is clear. Specifically asked if it was brown or tea colored and she denies.      ____________________________________________   FINAL  CLINICAL IMPRESSION(S) / ED DIAGNOSES  Final diagnoses:  Muscle spasm      NEW MEDICATIONS STARTED DURING THIS VISIT:  New Prescriptions   CARISOPRODOL (SOMA) 350 MG TABLET    Take 1 tablet (350 mg total) by mouth 3 (three) times daily as needed for muscle spasms.   DICLOFENAC SODIUM 3 % GEL    Place 1 application onto the skin every 12 (twelve) hours as needed.     Note:  This document was prepared using Dragon voice recognition software and may include unintentional dictation errors.    Orbie Pyo, MD 02/26/17 [phone]

## 2017-02-26 NOTE — ED Notes (Signed)
Pt sleeping upon entering room. When pt woken by this RN pt states pain persistent 10/10

## 2017-02-26 NOTE — ED Notes (Signed)
Pt up in room, able to ambulate with minimal assist. Reports pain hasn't really improved, describes muscle pain to top of thighs and lateral sides. MD at bedside for evaluation.

## 2017-02-26 NOTE — ED Triage Notes (Addendum)
Pt presents to ED with sharp bilateral leg pain. Pt states she exercised over the weekend and noticed slight pain on Saturday evening. Symptoms have worsened since the. Pt reports her pain is from her knee to the top of her leg. Had a massage earlier today but pain did not improve.

## 2017-02-27 DIAGNOSIS — D72829 Elevated white blood cell count, unspecified: Secondary | ICD-10-CM

## 2017-02-27 DIAGNOSIS — R739 Hyperglycemia, unspecified: Secondary | ICD-10-CM

## 2017-02-27 LAB — CBC
HEMATOCRIT: 40.4 % (ref 35.0–47.0)
Hemoglobin: 14.2 g/dL (ref 12.0–16.0)
MCH: 31.9 pg (ref 26.0–34.0)
MCHC: 35.1 g/dL (ref 32.0–36.0)
MCV: 91.1 fL (ref 80.0–100.0)
PLATELETS: 242 10*3/uL (ref 150–440)
RBC: 4.43 MIL/uL (ref 3.80–5.20)
RDW: 13.8 % (ref 11.5–14.5)
WBC: 10.2 10*3/uL (ref 3.6–11.0)

## 2017-02-27 LAB — BASIC METABOLIC PANEL
Anion gap: 7 (ref 5–15)
BUN: 10 mg/dL (ref 6–20)
CO2: 24 mmol/L (ref 22–32)
Calcium: 8.9 mg/dL (ref 8.9–10.3)
Chloride: 109 mmol/L (ref 101–111)
Creatinine, Ser: 0.42 mg/dL — ABNORMAL LOW (ref 0.44–1.00)
GFR calc Af Amer: >60 mL/min (ref >60)
GLUCOSE: 106 mg/dL — AB (ref 65–99)
POTASSIUM: 3.4 mmol/L — AB (ref 3.5–5.1)
SODIUM: 140 mmol/L (ref 135–145)

## 2017-02-27 LAB — CK
CK TOTAL: 27152 U/L — AB (ref 38–234)
Total CK: 22597 U/L — ABNORMAL HIGH (ref 38–234)

## 2017-02-27 LAB — TROPONIN I

## 2017-02-27 LAB — HEMOGLOBIN A1C
HEMOGLOBIN A1C: 5.2 % (ref 4.8–5.6)
Mean Plasma Glucose: 103 mg/dL

## 2017-02-27 MED ORDER — SODIUM BICARBONATE 8.4 % IV SOLN
INTRAVENOUS | Status: DC
  Filled 2017-02-27 (×3): qty 100

## 2017-02-27 MED ORDER — KCL IN DEXTROSE-NACL 20-5-0.45 MEQ/L-%-% IV SOLN
INTRAVENOUS | Status: DC
  Administered 2017-02-27: 08:00:00 via INTRAVENOUS
  Filled 2017-02-27 (×3): qty 1000

## 2017-02-27 MED ORDER — CLONIDINE HCL 0.3 MG/24HR TD PTWK
0.3000 mg | MEDICATED_PATCH | TRANSDERMAL | Status: DC
  Administered 2017-02-27: 18:00:00 0.3 mg via TRANSDERMAL
  Filled 2017-02-27: qty 1

## 2017-02-27 MED ORDER — IBUPROFEN 200 MG PO TABS: 200.0000 mg | ORAL_TABLET | Freq: Four times a day (QID) | ORAL | 0 refills | Status: DC | PRN

## 2017-02-27 MED ORDER — SODIUM BICARBONATE 8.4 % IV SOLN
INTRAVENOUS | Status: DC
  Administered 2017-02-27 – 2017-02-28 (×2): via INTRAVENOUS
  Filled 2017-02-27 (×5): qty 150

## 2017-02-27 NOTE — Progress Notes (Signed)
[place] at [place] NAME: Cynthia Keith    MR#:  [id number]  DATE OF BIRTH:  05/19/1946  SUBJECTIVE:  CHIEF COMPLAINT:   Chief Complaint  Patient presents with  . Leg Pain    Review of Systems  Constitutional: Negative for chills, fever and weight loss.  HENT: Negative for congestion.   Eyes: Negative for blurred vision and double vision.  Respiratory: Negative for cough, sputum production, shortness of breath and wheezing.   Cardiovascular: Negative for chest pain, palpitations, orthopnea, leg swelling and PND.  Gastrointestinal: Negative for abdominal pain, blood in stool, constipation, diarrhea, nausea and vomiting.  Genitourinary: Negative for dysuria, frequency, hematuria and urgency.  Musculoskeletal: Positive for myalgias. Negative for falls.  Neurological: Negative for dizziness, tremors, focal weakness and headaches.  Endo/Heme/Allergies: Does not bruise/bleed easily.  Psychiatric/Behavioral: Negative for depression. The patient does not have insomnia.     VITAL SIGNS: Blood pressure (!) 156/67, pulse 71, temperature 98.3 F (36.8 C), temperature source Oral, resp. rate 18, height 4\' 9"  (1.448 m), weight 61.6 kg (135 lb 11.2 oz), SpO2 99 %.  PHYSICAL EXAMINATION:   GENERAL:  71 y.o.-year-old patient lying in the bed with no acute distress.  EYES: Pupils equal, round, reactive to light and accommodation. No scleral icterus. Extraocular muscles intact.  HEENT: Head atraumatic, normocephalic. Oropharynx and nasopharynx clear.  NECK:  Supple, no jugular venous distention. No thyroid enlargement, no tenderness.  LUNGS: Normal breath sounds bilaterally, no wheezing, rales,rhonchi or crepitation. No use of accessory muscles of respiration.  CARDIOVASCULAR: S1, S2 normal. No murmurs, rubs, or gallops.  ABDOMEN: Soft, nontender, nondistended. Bowel sounds present. No organomegaly or mass.  EXTREMITIES: No pedal edema,  cyanosis, or clubbing.  NEUROLOGIC: Cranial nerves II through XII are intact. Muscle strength 5/5 in all extremities. Sensation intact. Gait not checked.  PSYCHIATRIC: The patient is alert and oriented x 3.  SKIN: No obvious rash, lesion, or ulcer.  Mild discomfort on muscle palpation  In anterior thighs, no  swelling    ORDERS/RESULTS REVIEWED:   CBC  Recent Labs Lab 02/26/17 0758 02/27/17 0051  WBC 11.3* 10.2  HGB 14.9 14.2  HCT 42.4 40.4  PLT 245 242  MCV 92.2 91.1  MCH 32.4 31.9  MCHC 35.2 35.1  RDW 13.4 13.8   ------------------------------------------------------------------------------------------------------------------  Chemistries   Recent Labs Lab 02/26/17 0758 02/27/17 0051  NA 137 140  K 3.5 3.4*  CL 103 109  CO2 27 24  GLUCOSE 129* 106*  BUN 10 10  CREATININE 0.42* 0.42*  CALCIUM 9.2 8.9   ------------------------------------------------------------------------------------------------------------------ estimated creatinine clearance is 49.4 mL/min (by C-G formula based on SCr of 0.42 mg/dL (L)). ------------------------------------------------------------------------------------------------------------------  Recent Labs  02/26/17 1304  TSH 1.387    Cardiac Enzymes  Recent Labs Lab 02/26/17 1304 02/26/17 1922 02/27/17 0051  TROPONINI <0.03 <0.03 <0.03   ------------------------------------------------------------------------------------------------------------------ Invalid input(s): POCBNP ---------------------------------------------------------------------------------------------------------------  RADIOLOGY: No results found.  EKG:  Orders placed or performed in visit on 11/01/13  . EKG 12-Lead    ASSESSMENT AND PLAN:  Principal Problem:   Rhabdomyolysis Active Problems:   Malignant essential hypertension   Leukocytosis   Hyperglycemia  #1. Rhabdomyolysis due to excessive exercise, initiate patient on  Bicarbonate  drip, follow CK levels and creatinine in the morning, TSH, . Hemoglobin A1c were normal #2. Malignant essential hypertension, continue outpatient medications,  Add clonidine patch #3. Leukocytosis, no infection was noted, , resolved #4. Hyperglycemia, hemoglobin, A1c is normal, no  diabetes  Management plans discussed with the patient, family and they are in agreement.   DRUG ALLERGIES:  Allergies  Allergen Reactions  . Iodine Shortness Of Breath    "Quit Breathing"    CODE STATUS:     Code Status Orders        Start     Ordered   02/26/17 1249  Full code  Continuous     02/26/17 1249    Code Status History    Date Active Date Inactive Code Status Order ID Comments User Context   This patient has a current code status but no historical code status.      TOTAL TIME TAKING CARE OF THIS PATIENNT 35 minutes.    Theodoro Grist M.D on 02/27/2017 at 5:37 PM  Between 7am to 6pm - Pager - [phone]  After 6pm go to [email or web address] - password [email or web address] [facility]  [facility]  Office  [phone]  CC: Primary care physician; Christie Nottingham., PA

## 2017-02-28 LAB — POTASSIUM: Potassium: 3.3 mmol/L — ABNORMAL LOW (ref 3.5–5.1)

## 2017-02-28 LAB — CK
CK TOTAL: 23365 U/L — AB (ref 38–234)
Total CK: 23164 U/L — ABNORMAL HIGH (ref 38–234)

## 2017-02-28 MED ORDER — ISOSORBIDE MONONITRATE ER 30 MG PO TB24: 30.0000 mg | ORAL_TABLET | Freq: Every day | ORAL | 2 refills | Status: DC

## 2017-02-28 MED ORDER — POTASSIUM CHLORIDE CRYS ER 20 MEQ PO TBCR
40.0000 meq | EXTENDED_RELEASE_TABLET | Freq: Once | ORAL | Status: AC
  Administered 2017-02-28: 40 meq via ORAL
  Filled 2017-02-28: qty 2

## 2017-02-28 MED ORDER — AMLODIPINE BESYLATE 10 MG PO TABS: 10.0000 mg | ORAL_TABLET | Freq: Every day | ORAL | 2 refills | Status: DC

## 2017-02-28 MED ORDER — ISOSORBIDE MONONITRATE ER 30 MG PO TB24
30.0000 mg | ORAL_TABLET | Freq: Every day | ORAL | Status: DC
  Administered 2017-02-28: 15:00:00 30 mg via ORAL
  Filled 2017-02-28: qty 1

## 2017-02-28 NOTE — Care Management Important Message (Signed)
Important Message  Patient Details  Name: RONIE FLEEGER MRN: [id number] Date of Birth: 28-Oct-1946   Medicare Important Message Given:  Yes    Shelbie Ammons, RN 02/28/2017, 9:50 AM

## 2017-02-28 NOTE — Discharge Summary (Signed)
[place] [facility] [place] NAME: Cynthia Keith    MR#:  [id number]  DATE OF BIRTH:  July 02, 1946  DATE OF ADMISSION:  02/26/2017   ADMITTING PHYSICIAN: Theodoro Grist, MD  DATE OF DISCHARGE: 02/28/2017  PRIMARY CARE PHYSICIAN: Christie Nottingham., PA   ADMISSION DIAGNOSIS:   Muscle spasm [M62.838] Bilateral leg pain [M79.604, M79.605] Secondary hypertension [I15.9] Non-traumatic rhabdomyolysis [M62.82] Rhabdomyolysis [M62.82]  DISCHARGE DIAGNOSIS:   Principal Problem:   Rhabdomyolysis Active Problems:   Malignant essential hypertension   Leukocytosis   Hyperglycemia   SECONDARY DIAGNOSIS:   Past Medical History:  Diagnosis Date  . GERD (gastroesophageal reflux disease)   . Hypertension     HOSPITAL COURSE:   71 y/o GERD, HTN presents to hospital secondary to muscle aches and noted to have elevated CK levels.  #1 rhabdomyolysis-secondary to recent exertional activity-going to fitness center. -CK levels greater than 29,000 yesterday. Improving with fluids. -Patient's symptoms have resolved. Renal function is normal. Encourage to take plenty of fluids and have CK levels checked as an outpatient in 3 days. -She has been ambulating without any difficulty here.  #2 hypertension-blood pressure has been elevated here. Increased her Norvasc and also added Imdur.  #3 GERD-on Prilosec  Stable for discharge today  DISCHARGE CONDITIONS:   Guarded CONSULTS OBTAINED:    none  DRUG ALLERGIES:   Allergies  Allergen Reactions  . Iodine Shortness Of Breath    "Quit Breathing"   DISCHARGE MEDICATIONS:   Allergies as of 02/28/2017      Reactions   Iodine Shortness Of Breath   "Quit Breathing"      Medication List    TAKE these medications   amLODipine 10 MG tablet Commonly known as:  NORVASC Take 1 tablet (10 mg total) by mouth daily. Start taking on:  03/01/2017 What changed:  medication strength  how much to take     aspirin EC 81 MG tablet Take 81 mg by mouth daily.   carisoprodol 350 MG tablet Commonly known as:  SOMA Take 1 tablet (350 mg total) by mouth 3 (three) times daily as needed for muscle spasms.   Diclofenac Sodium 3 % Gel Place 1 application onto the skin every 12 (twelve) hours as needed.   ibuprofen 200 MG tablet Commonly known as:  ADVIL,MOTRIN Take 1 tablet (200 mg total) by mouth every 6 (six) hours as needed for moderate pain.   isosorbide mononitrate 30 MG 24 hr tablet Commonly known as:  IMDUR Take 1 tablet (30 mg total) by mouth daily.   multivitamin tablet Take 1 tablet by mouth daily.   omeprazole 20 MG capsule Commonly known as:  PRILOSEC Take 20 mg by mouth daily.        DISCHARGE INSTRUCTIONS:   1. CPK levels follow up in 3 days 2. PCP follow-up in 1-2 weeks  DIET:   Cardiac diet  ACTIVITY:   Activity as tolerated  OXYGEN:   Home Oxygen: No.  Oxygen Delivery: room air  DISCHARGE LOCATION:   home   If you experience worsening of your admission symptoms, develop shortness of breath, life threatening emergency, suicidal or homicidal thoughts you must seek medical attention immediately by calling 911 or calling your MD immediately  if symptoms less severe.  You Must read complete instructions/literature along with all the possible adverse reactions/side effects for all the Medicines you take and that have been prescribed to you. Take any new Medicines after you have completely understood  and accpet all the possible adverse reactions/side effects.   Please note  You were cared for by a hospitalist during your hospital stay. If you have any questions about your discharge medications or the care you received while you were in the hospital after you are discharged, you can call the unit and asked to speak with the hospitalist on call if the hospitalist that took care of you is not available. Once you are discharged, your primary care physician will  handle any further medical issues. Please note that NO REFILLS for any discharge medications will be authorized once you are discharged, as it is imperative that you return to your primary care physician (or establish a relationship with a primary care physician if you do not have one) for your aftercare needs so that they can reassess your need for medications and monitor your lab values.    On the day of Discharge:  VITAL SIGNS:   Blood pressure (!) 133/58, pulse 81, temperature 97.5 F (36.4 C), temperature source Oral, resp. rate 18, height 4\' 9"  (1.448 m), weight 61.6 kg (135 lb 11.2 oz), SpO2 97 %.  PHYSICAL EXAMINATION:    GENERAL:  71 y.o.-year-old patient lying in the bed with no acute distress.  EYES: Pupils equal, round, reactive to light and accommodation. No scleral icterus. Extraocular muscles intact.  HEENT: Head atraumatic, normocephalic. Oropharynx and nasopharynx clear.  NECK:  Supple, no jugular venous distention. No thyroid enlargement, no tenderness.  LUNGS: Normal breath sounds bilaterally, no wheezing, rales,rhonchi or crepitation. No use of accessory muscles of respiration.  CARDIOVASCULAR: S1, S2 normal. No murmurs, rubs, or gallops.  ABDOMEN: Soft, non-tender, non-distended. Bowel sounds present. No organomegaly or mass.  EXTREMITIES: No pedal edema, cyanosis, or clubbing.  NEUROLOGIC: Cranial nerves II through XII are intact. Muscle strength 5/5 in all extremities. Sensation intact. Gait not checked.  PSYCHIATRIC: The patient is alert and oriented x 3.  SKIN: No obvious rash, lesion, or ulcer.   DATA REVIEW:   CBC  Recent Labs Lab 02/27/17 0051  WBC 10.2  HGB 14.2  HCT 40.4  PLT 242    Chemistries   Recent Labs Lab 02/27/17 0051 02/28/17 0427  NA 140  --   K 3.4* 3.3*  CL 109  --   CO2 24  --   GLUCOSE 106*  --   BUN 10  --   CREATININE 0.42*  --   CALCIUM 8.9  --      Microbiology Results  No results found for this or any previous  visit.  RADIOLOGY:  No results found.   Management plans discussed with the patient, family and they are in agreement.  CODE STATUS:     Code Status Orders        Start     Ordered   02/26/17 1249  Full code  Continuous     02/26/17 1249    Code Status History    Date Active Date Inactive Code Status Order ID Comments User Context   This patient has a current code status but no historical code status.      TOTAL TIME TAKING CARE OF THIS PATIENT: 38 minutes.    Gladstone Lighter M.D on 02/28/2017 at 2:36 PM  Between 7am to 6pm - Pager - [phone]  After 6pm go to [email or web address] - Proofreader  [facility]  Office  [phone]  CC: Primary care physician; Christie Nottingham., PA   Note: This dictation was prepared with Viviann Spare  dictation along with smaller phrase technology. Any transcriptional errors that result from this process are unintentional.

## 2017-03-04 DIAGNOSIS — L814 Other melanin hyperpigmentation: Secondary | ICD-10-CM | POA: Diagnosis not present

## 2017-03-04 DIAGNOSIS — D692 Other nonthrombocytopenic purpura: Secondary | ICD-10-CM | POA: Diagnosis not present

## 2017-03-04 DIAGNOSIS — I781 Nevus, non-neoplastic: Secondary | ICD-10-CM | POA: Diagnosis not present

## 2017-03-04 DIAGNOSIS — E782 Mixed hyperlipidemia: Secondary | ICD-10-CM | POA: Diagnosis not present

## 2017-03-04 DIAGNOSIS — I8393 Asymptomatic varicose veins of bilateral lower extremities: Secondary | ICD-10-CM | POA: Diagnosis not present

## 2017-03-04 DIAGNOSIS — I10 Essential (primary) hypertension: Secondary | ICD-10-CM | POA: Diagnosis not present

## 2017-03-04 DIAGNOSIS — Z1283 Encounter for screening for malignant neoplasm of skin: Secondary | ICD-10-CM | POA: Diagnosis not present

## 2017-03-04 DIAGNOSIS — L578 Other skin changes due to chronic exposure to nonionizing radiation: Secondary | ICD-10-CM | POA: Diagnosis not present

## 2017-03-04 DIAGNOSIS — M6282 Rhabdomyolysis: Secondary | ICD-10-CM | POA: Diagnosis not present

## 2017-03-04 DIAGNOSIS — L300 Nummular dermatitis: Secondary | ICD-10-CM | POA: Diagnosis not present

## 2017-03-04 DIAGNOSIS — L821 Other seborrheic keratosis: Secondary | ICD-10-CM | POA: Diagnosis not present

## 2017-03-04 DIAGNOSIS — M609 Myositis, unspecified: Secondary | ICD-10-CM | POA: Diagnosis not present

## 2017-03-04 DIAGNOSIS — D229 Melanocytic nevi, unspecified: Secondary | ICD-10-CM | POA: Diagnosis not present

## 2017-03-04 DIAGNOSIS — L820 Inflamed seborrheic keratosis: Secondary | ICD-10-CM | POA: Diagnosis not present

## 2017-03-11 DIAGNOSIS — R350 Frequency of micturition: Secondary | ICD-10-CM | POA: Diagnosis not present

## 2017-03-18 DIAGNOSIS — M6282 Rhabdomyolysis: Secondary | ICD-10-CM | POA: Diagnosis not present

## 2017-03-18 DIAGNOSIS — N390 Urinary tract infection, site not specified: Secondary | ICD-10-CM | POA: Diagnosis not present

## 2017-03-18 DIAGNOSIS — E782 Mixed hyperlipidemia: Secondary | ICD-10-CM | POA: Diagnosis not present

## 2017-03-18 DIAGNOSIS — I10 Essential (primary) hypertension: Secondary | ICD-10-CM | POA: Diagnosis not present

## 2017-04-01 DIAGNOSIS — M609 Myositis, unspecified: Secondary | ICD-10-CM | POA: Diagnosis not present

## 2017-04-22 DIAGNOSIS — I10 Essential (primary) hypertension: Secondary | ICD-10-CM | POA: Diagnosis not present

## 2017-04-22 DIAGNOSIS — M6282 Rhabdomyolysis: Secondary | ICD-10-CM | POA: Diagnosis not present

## 2017-04-22 DIAGNOSIS — N390 Urinary tract infection, site not specified: Secondary | ICD-10-CM | POA: Diagnosis not present

## 2017-06-10 DIAGNOSIS — M5442 Lumbago with sciatica, left side: Secondary | ICD-10-CM | POA: Diagnosis not present

## 2017-06-10 DIAGNOSIS — M545 Low back pain: Secondary | ICD-10-CM | POA: Diagnosis not present

## 2017-07-22 DIAGNOSIS — M5442 Lumbago with sciatica, left side: Secondary | ICD-10-CM | POA: Diagnosis not present

## 2017-07-22 DIAGNOSIS — I8310 Varicose veins of unspecified lower extremity with inflammation: Secondary | ICD-10-CM | POA: Diagnosis not present

## 2017-07-22 DIAGNOSIS — I10 Essential (primary) hypertension: Secondary | ICD-10-CM | POA: Diagnosis not present

## 2017-10-14 DIAGNOSIS — Z961 Presence of intraocular lens: Secondary | ICD-10-CM | POA: Diagnosis not present

## 2017-10-14 DIAGNOSIS — H1859 Other hereditary corneal dystrophies: Secondary | ICD-10-CM | POA: Diagnosis not present

## 2017-10-28 DIAGNOSIS — Z0001 Encounter for general adult medical examination with abnormal findings: Secondary | ICD-10-CM | POA: Diagnosis not present

## 2017-10-28 DIAGNOSIS — K219 Gastro-esophageal reflux disease without esophagitis: Secondary | ICD-10-CM | POA: Diagnosis not present

## 2017-10-28 DIAGNOSIS — I10 Essential (primary) hypertension: Secondary | ICD-10-CM | POA: Diagnosis not present

## 2017-10-28 DIAGNOSIS — L309 Dermatitis, unspecified: Secondary | ICD-10-CM | POA: Diagnosis not present

## 2017-11-11 DIAGNOSIS — L300 Nummular dermatitis: Secondary | ICD-10-CM | POA: Diagnosis not present

## 2017-11-26 DIAGNOSIS — M79604 Pain in right leg: Secondary | ICD-10-CM | POA: Diagnosis not present

## 2017-11-26 DIAGNOSIS — M6282 Rhabdomyolysis: Secondary | ICD-10-CM | POA: Diagnosis not present

## 2017-11-26 DIAGNOSIS — I10 Essential (primary) hypertension: Secondary | ICD-10-CM | POA: Diagnosis not present

## 2017-11-26 DIAGNOSIS — R7301 Impaired fasting glucose: Secondary | ICD-10-CM | POA: Diagnosis not present

## 2017-11-26 DIAGNOSIS — D72829 Elevated white blood cell count, unspecified: Secondary | ICD-10-CM | POA: Diagnosis not present

## 2017-11-26 DIAGNOSIS — M62838 Other muscle spasm: Secondary | ICD-10-CM | POA: Diagnosis not present

## 2017-12-10 IMAGING — MG MM DIGITAL DIAGNOSTIC UNILAT*R* W/ TOMO W/ CAD
6 series · 6 of 14 positions shown · non-contrast
Comparison: Previous exam(s).

CLINICAL DATA: 69-year-old female, callback from screening
mammogram for possible right breast mass

EXAM:
2D DIGITAL DIAGNOSTIC RIGHT MAMMOGRAM WITH ADJUNCT TOMO
ULTRASOUND RIGHT BREAST

[R MLO synth-2D]
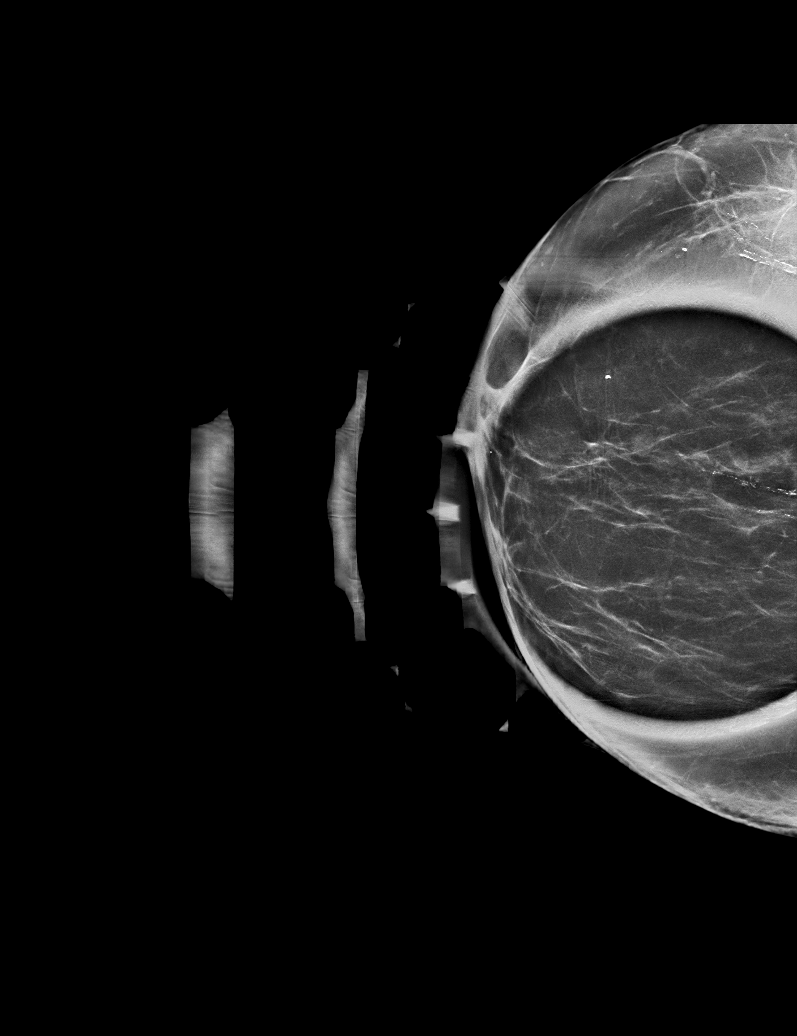

[R CC]
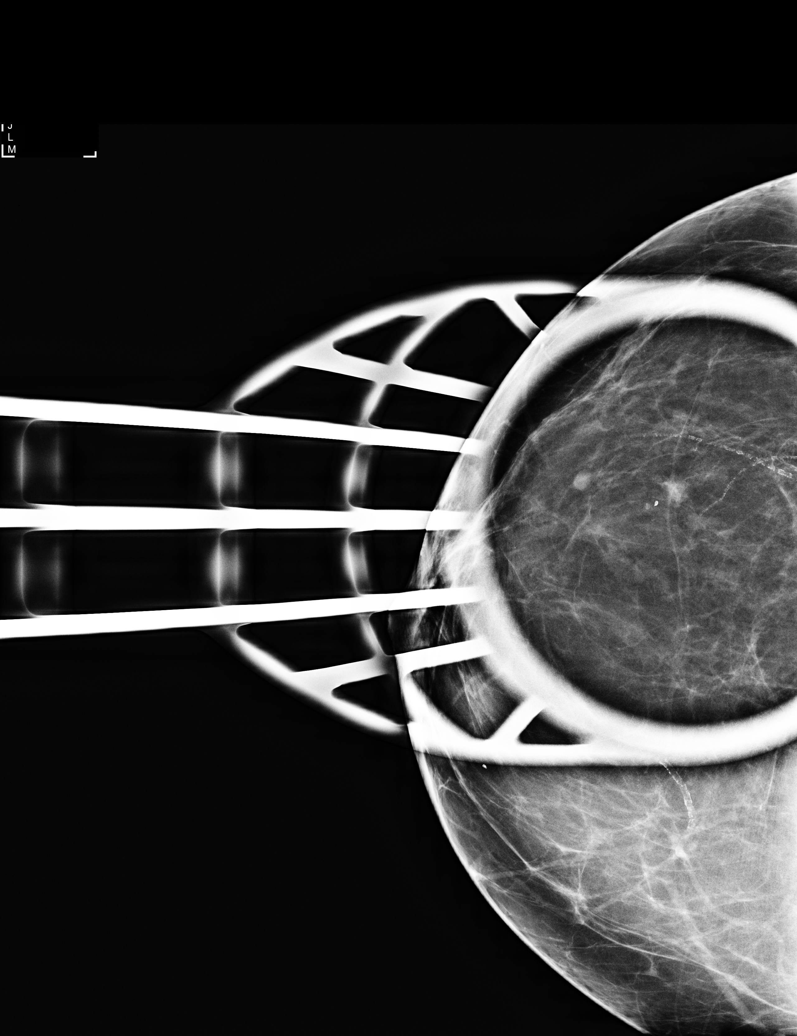

[R CC synth-2D]
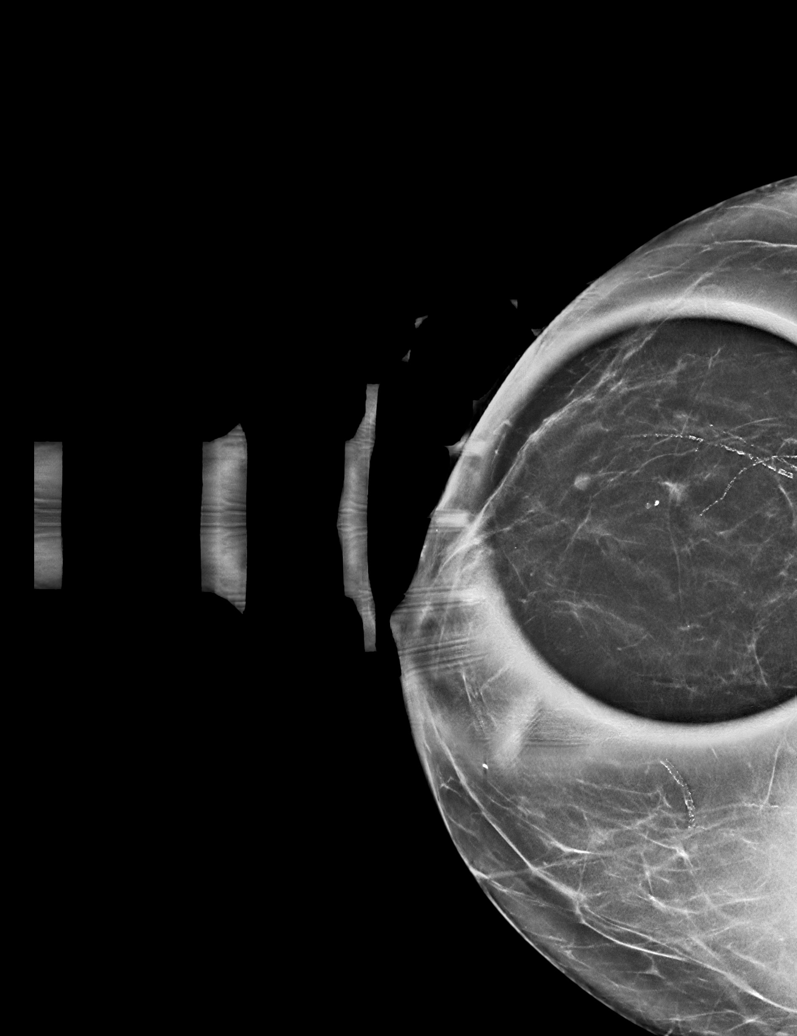

[R MLO]
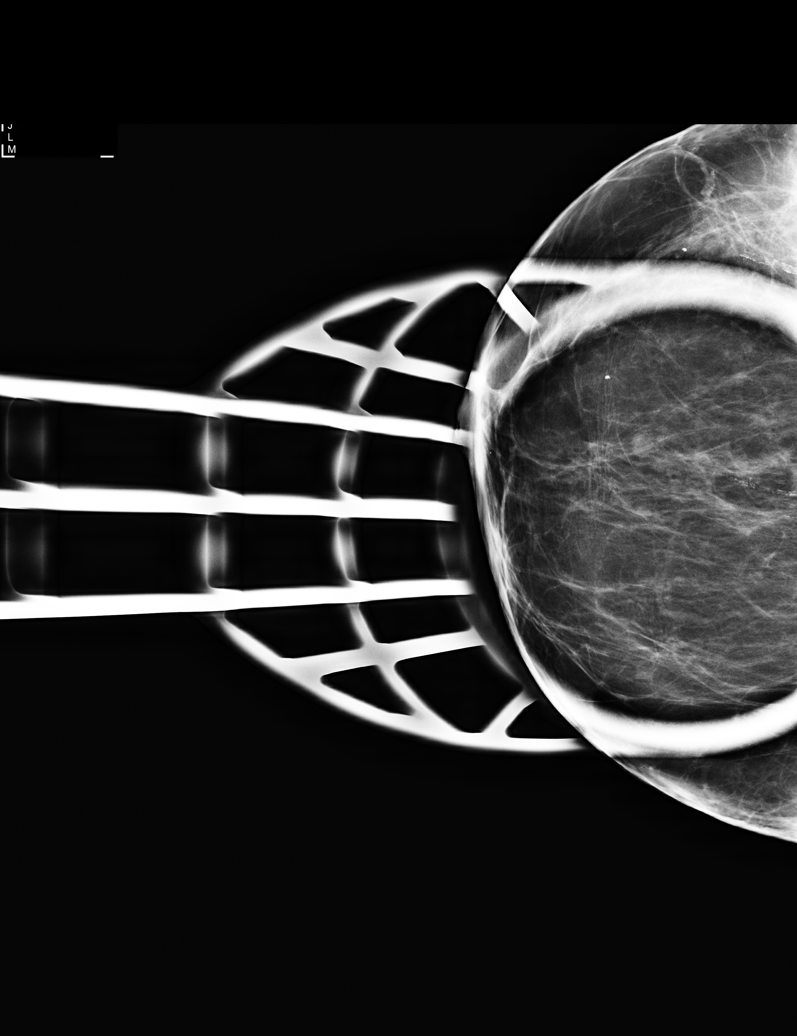

[R MLO tomo · tomo slice 33/66.0]
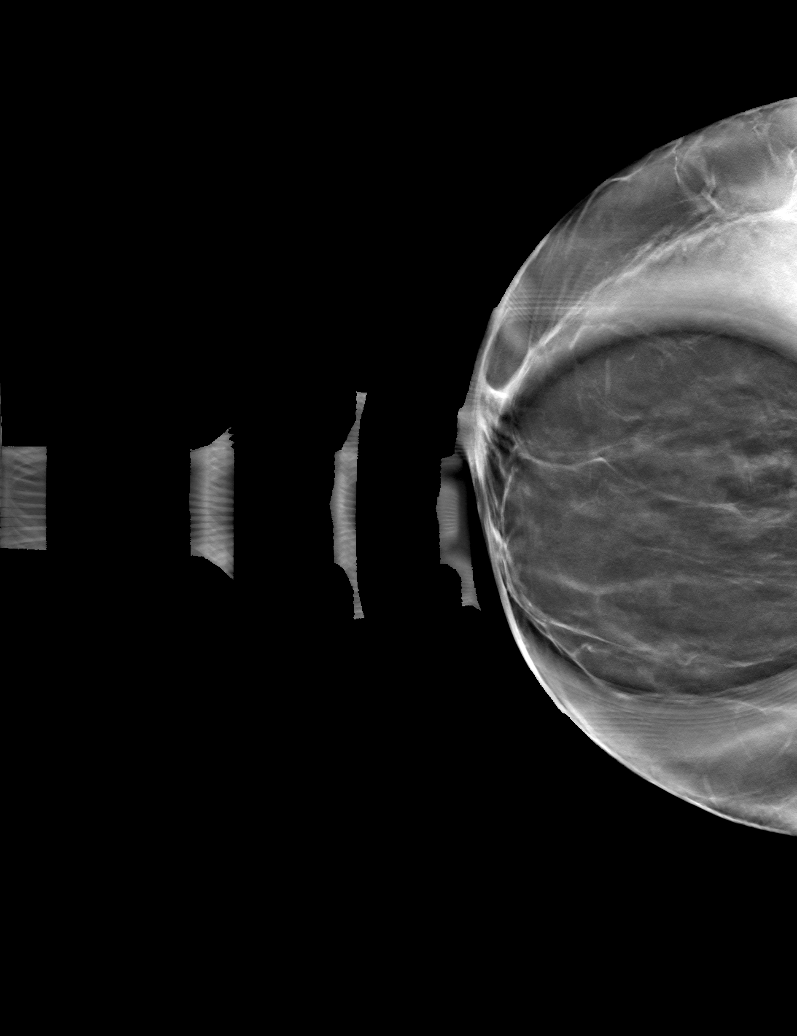

[R CC tomo · tomo slice 29/57.0]
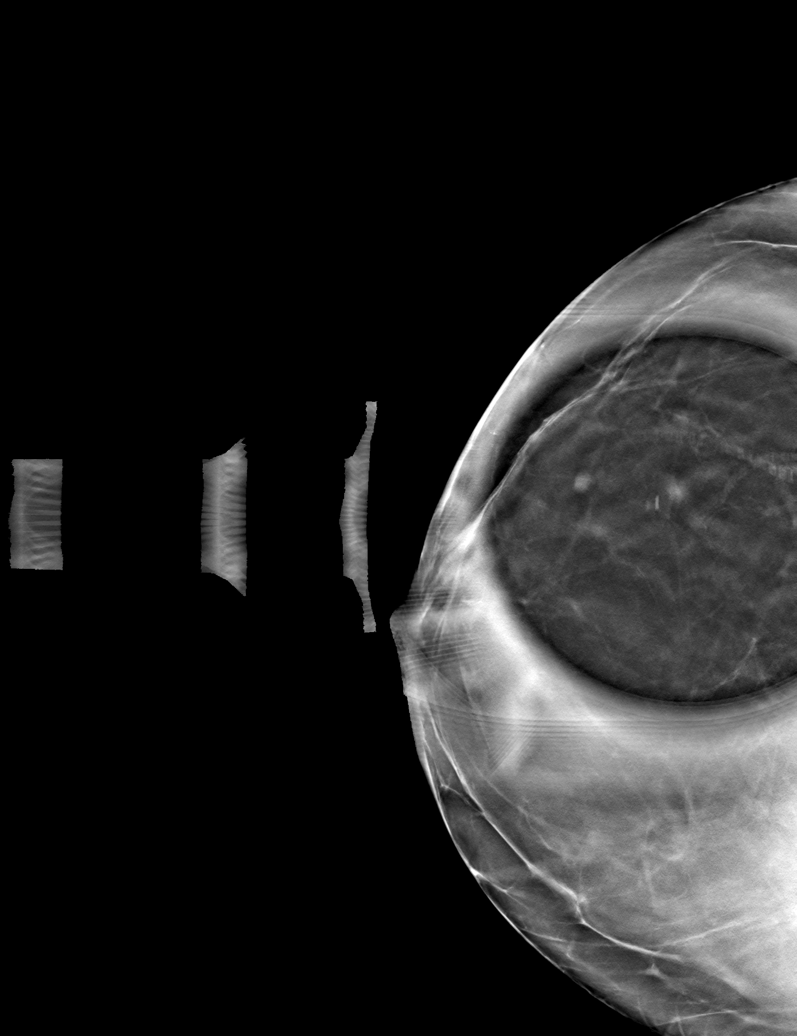

[6 of 14 positions shown; findings below may reference images not displayed]

ACR Breast Density Category b: There are scattered areas of
fibroglandular density.
FINDINGS: CC and MLO spot-compression views of the right breast were performed
with tomosynthesis. On the additional views, there is a 5 mm oval,
circumscribed oval mass within the lateral right breast at the level
of the nipple.

On physical exam, no discrete mass is felt in the area of concern
within the lateral right breast.

Targeted ultrasound is performed, showing an oval, circumscribed,
hypoechoic mass at 9 o'clock, 3 cm from nipple measuring 5 x 3 x 4
mm. There is associated increased through transmission and no
internal vascularity. This mass is thought to represent a small
complicated cyst. Mobile, internal echoes are noted on real-time
exam. No suspicious cystic or solid sonographic finding was
identified in the area of concern.
IMPRESSION: Probably benign right breast mass.

RECOMMENDATION:
Right diagnostic mammogram and possible ultrasound in 6 months.
Options including short-term follow-up versus ultrasound-guided
aspiration were discussed with the patient. The patient wishes to
pursue short-term follow-up at this time.

I have discussed the findings and recommendations with the patient.
Results were also provided in writing at the conclusion of the
visit. If applicable, a reminder letter will be sent to the patient
regarding the next appointment.

BI-RADS CATEGORY  3: Probably benign.

## 2018-01-02 ENCOUNTER — Telehealth: Payer: Self-pay

## 2018-01-02 DIAGNOSIS — N816 Rectocele: Secondary | ICD-10-CM | POA: Diagnosis not present

## 2018-01-02 DIAGNOSIS — Z4889 Encounter for other specified surgical aftercare: Secondary | ICD-10-CM | POA: Diagnosis not present

## 2018-01-02 DIAGNOSIS — R51 Headache: Secondary | ICD-10-CM | POA: Diagnosis not present

## 2018-01-02 DIAGNOSIS — I10 Essential (primary) hypertension: Secondary | ICD-10-CM | POA: Diagnosis not present

## 2018-01-02 DIAGNOSIS — Z7983 Long term (current) use of bisphosphonates: Secondary | ICD-10-CM | POA: Diagnosis not present

## 2018-01-02 DIAGNOSIS — Z79899 Other long term (current) drug therapy: Secondary | ICD-10-CM | POA: Diagnosis not present

## 2018-01-02 DIAGNOSIS — Z7982 Long term (current) use of aspirin: Secondary | ICD-10-CM | POA: Diagnosis not present

## 2018-01-02 DIAGNOSIS — R030 Elevated blood-pressure reading, without diagnosis of hypertension: Secondary | ICD-10-CM | POA: Diagnosis not present

## 2018-01-02 DIAGNOSIS — M1990 Unspecified osteoarthritis, unspecified site: Secondary | ICD-10-CM | POA: Diagnosis not present

## 2018-01-02 NOTE — Telephone Encounter (Signed)
Pt called 4.45 that she at surgeon office her bp is high176/98 and again 229/95 as per dr Latricia Heft left pt voicemail need to go ER

## 2018-01-03 NOTE — Telephone Encounter (Signed)
error 

## 2018-01-13 ENCOUNTER — Encounter: Payer: Self-pay | Admitting: Nurse Practitioner

## 2018-01-13 ENCOUNTER — Ambulatory Visit (INDEPENDENT_AMBULATORY_CARE_PROVIDER_SITE_OTHER): Payer: Medicare HMO | Admitting: Nurse Practitioner

## 2018-01-13 VITALS — BP 160/80 | HR 78 | Resp 16 | Ht 59.0 in | Wt 138.0 lb

## 2018-01-13 DIAGNOSIS — N390 Urinary tract infection, site not specified: Secondary | ICD-10-CM

## 2018-01-13 DIAGNOSIS — K219 Gastro-esophageal reflux disease without esophagitis: Secondary | ICD-10-CM

## 2018-01-13 DIAGNOSIS — I10 Essential (primary) hypertension: Secondary | ICD-10-CM

## 2018-01-13 LAB — POCT URINALYSIS DIPSTICK
BILIRUBIN UA: NEGATIVE
GLUCOSE UA: NEGATIVE
KETONES UA: NEGATIVE
Nitrite, UA: NEGATIVE
PH UA: 8.5 — AB (ref 5.0–8.0)
Protein, UA: NEGATIVE
RBC UA: NEGATIVE
UROBILINOGEN UA: 0.2 U/dL

## 2018-01-13 MED ORDER — AMLODIPINE BESYLATE 10 MG PO TABS: 10.0000 mg | ORAL_TABLET | Freq: Every day | ORAL | 2 refills | Status: DC

## 2018-01-13 MED ORDER — AMOXICILLIN-POT CLAVULANATE 875-125 MG PO TABS: 1.0000 | ORAL_TABLET | Freq: Two times a day (BID) | ORAL | 0 refills | Status: DC

## 2018-01-13 NOTE — Addendum Note (Signed)
Addended byWaynard Edwards on: 01/13/2018 04:33 PM   Modules accepted: Orders

## 2018-01-13 NOTE — Progress Notes (Signed)
[facility] [place], [place] [address]  Internal MEDICINE  Office Visit Note  Patient Name: Cynthia Keith  [id number]  [id number]  Date of Service: 01/13/2018  No chief complaint on file.   Was seen in ER on Thursday before last. Blood pressure very high when she saw her cardiologist. Blood pressure was 229/96  ECG and blood work was ok. No medication changes were made. They advised her to see her PCP to make any blood pressure medication changes. She did have a nose bleed last Friday. She is also feeling fatigued.  She also reports urinary frequency. She did give a urine sample to look for UTI. She reports no pain or burning with urination.   Other  This is a chronic problem. The current episode started more than 1 year ago. The problem occurs constantly. The problem has been gradually improving. Associated symptoms include headaches. Pertinent negatives include no abdominal pain, arthralgias, myalgias, nausea or vomiting. Associated symptoms comments: nosebleed. The symptoms are aggravated by stress. Treatments tried: oral blood pressure medication. The treatment provided mild relief.    Pt is here for routine follow up.    Current Medication: Outpatient Encounter Medications as of 01/13/2018  Medication Sig  . amLODipine (NORVASC) 5 MG tablet Take 5 mg by mouth at bedtime.  Marland Kitchen aspirin EC 81 MG tablet Take 81 mg by mouth daily.  . Multiple Vitamin (MULTIVITAMIN) tablet Take 1 tablet by mouth daily.  Marland Kitchen nystatin cream (MYCOSTATIN) Apply 1 application topically 2 (two) times daily.  Marland Kitchen omeprazole (PRILOSEC) 20 MG capsule Take 20 mg by mouth daily.  Marland Kitchen amLODipine (NORVASC) 10 MG tablet Take 1 tablet (10 mg total) by mouth daily. (Patient not taking: Reported on 01/13/2018)  . ibuprofen (ADVIL,MOTRIN) 200 MG tablet Take 1 tablet (200 mg total) by mouth every 6 (six) hours as needed for moderate pain. (Patient not taking: Reported on 01/13/2018)  . isosorbide  mononitrate (IMDUR) 30 MG 24 hr tablet Take 1 tablet (30 mg total) by mouth daily. (Patient not taking: Reported on 01/13/2018)   No facility-administered encounter medications on file as of 01/13/2018.     Surgical History: Past Surgical History:  Procedure Laterality Date  . ABDOMINAL HYSTERECTOMY    . COLONOSCOPY WITH PROPOFOL N/A 12/12/2015   Procedure: COLONOSCOPY WITH PROPOFOL;  Surgeon: Manya Silvas, MD;  Location: [facility] ENDOSCOPY;  Service: Endoscopy;  Laterality: N/A;  . HAND SURGERY  2005    Medical History: Past Medical History:  Diagnosis Date  . GERD (gastroesophageal reflux disease)   . Hyperlipidemia   . Hypertension   . Plantar fasciitis   . Sleep apnea     Family History: Family History  Problem Relation Age of Onset  . Breast cancer Cousin        2 maternal cousins  . Stroke Mother   . Heart disease Paternal Grandmother   . Cancer Paternal Grandfather     Social History   Socioeconomic History  . Marital status: Married    Spouse name: Not on file  . Number of children: Not on file  . Years of education: Not on file  . Highest education level: Not on file  Social Needs  . Financial resource strain: Not on file  . Food insecurity - worry: Not on file  . Food insecurity - inability: Not on file  . Transportation needs - medical: Not on file  . Transportation needs - non-medical: Not on file  Occupational History  .  Not on file  Tobacco Use  . Smoking status: Never Smoker  . Smokeless tobacco: Never Used  Substance and Sexual Activity  . Alcohol use: Yes    Comment: ocassionally  . Drug use: No  . Sexual activity: Not on file  Other Topics Concern  . Not on file  Social History Narrative  . Not on file      Review of Systems  HENT: Positive for nosebleeds.   Cardiovascular:       Elevated blood pressure .  Gastrointestinal: Negative for abdominal pain, diarrhea, nausea and vomiting.  Endocrine: Negative.   Genitourinary: Positive  for frequency and urgency.  Musculoskeletal: Negative for arthralgias, back pain, myalgias and neck stiffness.  Skin: Negative.   Neurological: Positive for headaches.  Hematological: Negative.   Psychiatric/Behavioral: Negative.     Today's Vitals   01/13/18 1514  BP: (!) 160/80  Pulse: 78  Resp: 16  SpO2: 96%  Weight: 138 lb (62.6 kg)  Height: 4\' 11"  (1.499 m)    Physical Exam  Constitutional: She is oriented to person, place, and time. She appears well-developed and well-nourished. No distress.  HENT:  Head: Normocephalic and atraumatic.  Mouth/Throat: Oropharynx is clear and moist. No oropharyngeal exudate.  Eyes: Conjunctivae and EOM are normal. Pupils are equal, round, and reactive to light.  Neck: Normal range of motion. Neck supple. No JVD present. No tracheal deviation present. No thyromegaly present.  Cardiovascular: Normal rate, regular rhythm and normal heart sounds. Exam reveals no gallop and no friction rub.  No murmur heard. Pulmonary/Chest: Effort normal and breath sounds normal. No respiratory distress. She has no wheezes. She has no rales. She exhibits no tenderness.  Abdominal: Soft. Bowel sounds are normal. There is no tenderness.  Genitourinary:  Genitourinary Comments: Urine sample positive for moderate WBC and elevated pH  Musculoskeletal: Normal range of motion.  Lymphadenopathy:    She has no cervical adenopathy.  Neurological: She is alert and oriented to person, place, and time. No cranial nerve deficit.  Skin: Skin is warm and dry. She is not diaphoretic.  Psychiatric: She has a normal mood and affect. Her behavior is normal. Judgment and thought content normal.    Assessment/Plan:  1. Urinary tract infection without hematuria, site unspecified - POCT Urinalysis Dipstick - positive for moderate WBC and pH 8.5 - amoxicillin-clavulanate (AUGMENTIN) 875-125 MG tablet; Take 1 tablet by mouth 2 (two) times daily.  Dispense: 20 tablet; Refill: 0 Send  urine for culture and sensitivity and adjust medication as indicated  2. Essential hypertension - amLODipine (NORVASC) 10 MG tablet; Take 1 tablet (10 mg total) by mouth daily.  Dispense: 30 tablet; Refill: 2 Monitor blood pressure every day and bring log to next visit  3. Gastroesophageal reflux disease without esophagitis Continue omeprazole as prescribed   Follow up as scheduled on 01/27/2018  Hypertension Counseling:   The following hypertensive lifestyle modification were recommended and discussed:  1. Limiting alcohol intake to less than 1 oz/day of ethanol:(24 oz of beer or 8 oz of wine or 2 oz of 100-proof whiskey). 2. Take baby ASA 81 mg daily. 3. Importance of regular aerobic exercise and losing weight. 4. Reduce dietary saturated fat and cholesterol intake for overall cardiovascular health. 5. Maintaining adequate dietary potassium, calcium, and magnesium intake. 6. Regular monitoring of the blood pressure. 7. Reduce sodium intake to less than 100 mmol/day (less than 2.3 gm of sodium or less than 6 gm of sodium choride)   General Counseling: Thayer Headings  verbalizes understanding of the findings of todays visit and agrees with plan of treatment. I have discussed any further diagnostic evaluation that may be needed or ordered today. We also reviewed her medications today. she has been encouraged to call the office with any questions or concerns that should arise related to todays visit.        Orders Placed This Encounter  Procedures  . POCT Urinalysis Dipstick     Time spent: 51 Minutes     Dr Lavera Guise Internal medicine

## 2018-01-15 LAB — CULTURE, URINE COMPREHENSIVE

## 2018-01-27 ENCOUNTER — Encounter: Payer: Self-pay | Admitting: Nurse Practitioner

## 2018-01-27 ENCOUNTER — Ambulatory Visit (INDEPENDENT_AMBULATORY_CARE_PROVIDER_SITE_OTHER): Payer: Medicare HMO | Admitting: Nurse Practitioner

## 2018-01-27 VITALS — BP 140/70 | HR 82 | Resp 16 | Ht 59.0 in | Wt 136.2 lb

## 2018-01-27 DIAGNOSIS — N390 Urinary tract infection, site not specified: Secondary | ICD-10-CM | POA: Diagnosis not present

## 2018-01-27 DIAGNOSIS — R300 Dysuria: Secondary | ICD-10-CM

## 2018-01-27 DIAGNOSIS — R319 Hematuria, unspecified: Secondary | ICD-10-CM | POA: Diagnosis not present

## 2018-01-27 DIAGNOSIS — I10 Essential (primary) hypertension: Secondary | ICD-10-CM

## 2018-01-27 DIAGNOSIS — Z1231 Encounter for screening mammogram for malignant neoplasm of breast: Secondary | ICD-10-CM

## 2018-01-27 DIAGNOSIS — Z1239 Encounter for other screening for malignant neoplasm of breast: Secondary | ICD-10-CM

## 2018-01-27 LAB — POCT URINALYSIS DIPSTICK
BILIRUBIN UA: NEGATIVE
Blood, UA: NEGATIVE
Glucose, UA: NEGATIVE
KETONES UA: NEGATIVE
Leukocytes, UA: NEGATIVE
Nitrite, UA: NEGATIVE
PH UA: 5 (ref 5.0–8.0)
Protein, UA: NEGATIVE
Spec Grav, UA: 1.01 (ref 1.010–1.025)
UROBILINOGEN UA: 0.2 U/dL

## 2018-01-27 NOTE — Addendum Note (Signed)
Addended by: Corlis Hove on: 01/27/2018 02:16 PM   Modules accepted: Orders

## 2018-01-27 NOTE — Progress Notes (Addendum)
[facility] [place], [place] [address]  Internal MEDICINE  Office Visit Note  Patient Name: Cynthia Keith  [id number]  [id number]  Date of Service: 01/27/2018  Chief Complaint  Patient presents with  . Hypertension    The patient is here for routine follow up exam. Increased her amlodipine to 10mg  at her last visit. BP much improved. She has no negative side effects since increase in medication.  She was also treated for UTI. Finished full course of augmentin. No longer having symptoms of UTI.    Hypertension  This is a chronic problem. The current episode started more than 1 year ago. The problem has been gradually improving since onset. The problem is resistant. Associated symptoms include anxiety. Pertinent negatives include no chest pain, neck pain, palpitations or shortness of breath. There are no associated agents to hypertension. There are no known risk factors for coronary artery disease. Past treatments include calcium channel blockers. The current treatment provides moderate improvement. There are no compliance problems.     Pt is here for routine follow up.    Current Medication: Outpatient Encounter Medications as of 01/27/2018  Medication Sig  . amLODipine (NORVASC) 10 MG tablet Take 1 tablet (10 mg total) by mouth daily.  Marland Kitchen amoxicillin-clavulanate (AUGMENTIN) 875-125 MG tablet Take 1 tablet by mouth 2 (two) times daily. (Patient not taking: Reported on 01/27/2018)  . aspirin EC 81 MG tablet Take 81 mg by mouth daily.  Marland Kitchen ibuprofen (ADVIL,MOTRIN) 200 MG tablet Take 1 tablet (200 mg total) by mouth every 6 (six) hours as needed for moderate pain. (Patient not taking: Reported on 01/13/2018)  . isosorbide mononitrate (IMDUR) 30 MG 24 hr tablet Take 1 tablet (30 mg total) by mouth daily. (Patient not taking: Reported on 01/13/2018)  . Multiple Vitamin (MULTIVITAMIN) tablet Take 1 tablet by mouth daily.  Marland Kitchen nystatin cream (MYCOSTATIN) Apply 1 application  topically 2 (two) times daily.  Marland Kitchen omeprazole (PRILOSEC) 20 MG capsule Take 20 mg by mouth daily.   No facility-administered encounter medications on file as of 01/27/2018.     Surgical History: Past Surgical History:  Procedure Laterality Date  . ABDOMINAL HYSTERECTOMY    . COLONOSCOPY WITH PROPOFOL N/A 12/12/2015   Procedure: COLONOSCOPY WITH PROPOFOL;  Surgeon: Manya Silvas, MD;  Location[facility] ENDOSCOPY;  Service: Endoscopy;  Laterality: N/A;  . HAND SURGERY  2005    Medical History: Past Medical History:  Diagnosis Date  . GERD (gastroesophageal reflux disease)   . Hyperlipidemia   . Hypertension   . Plantar fasciitis   . Sleep apnea     Family History: Family History  Problem Relation Age of Onset  . Breast cancer Cousin        2 maternal cousins  . Stroke Mother   . Heart disease Paternal Grandmother   . Cancer Paternal Grandfather     Social History   Socioeconomic History  . Marital status: Married    Spouse name: Not on file  . Number of children: Not on file  . Years of education: Not on file  . Highest education level: Not on file  Social Needs  . Financial resource strain: Not on file  . Food insecurity - worry: Not on file  . Food insecurity - inability: Not on file  . Transportation needs - medical: Not on file  . Transportation needs - non-medical: Not on file  Occupational History  . Not on file  Tobacco Use  . Smoking  status: Never Smoker  . Smokeless tobacco: Never Used  Substance and Sexual Activity  . Alcohol use: Yes    Comment: ocassionally  . Drug use: No  . Sexual activity: Not on file  Other Topics Concern  . Not on file  Social History Narrative  . Not on file      Review of Systems  Constitutional: Negative for chills, fatigue and unexpected weight change.  HENT: Negative for congestion, postnasal drip, rhinorrhea, sneezing and sore throat.   Eyes: Negative for redness.  Respiratory: Negative for cough, chest  tightness and shortness of breath.   Cardiovascular: Negative for chest pain and palpitations.       Improved bp since increased amlodipine   Gastrointestinal: Negative for abdominal pain, constipation, diarrhea, nausea and vomiting.  Endocrine: Negative for cold intolerance, heat intolerance, polydipsia, polyphagia and polyuria.  Genitourinary: Negative for dysuria and frequency.       No symptoms since being treated with augmentin.  Musculoskeletal: Positive for arthralgias. Negative for back pain, joint swelling and neck pain.  Skin: Negative for rash.  Allergic/Immunologic: Negative for environmental allergies.  Neurological: Negative.  Negative for tremors and numbness.  Hematological: Negative for adenopathy. Does not bruise/bleed easily.  Psychiatric/Behavioral: Negative for behavioral problems (Depression), sleep disturbance and suicidal ideas. The patient is nervous/anxious.     Vital Signs: BP 140/70   Pulse 82   Resp 16   Ht 4\' 11"  (1.499 m)   Wt 136 lb 3.2 oz (61.8 kg)   SpO2 99%   BMI 27.51 kg/m    Physical Exam  Constitutional: She is oriented to person, place, and time. She appears well-developed and well-nourished. No distress.  HENT:  Head: Normocephalic and atraumatic.  Mouth/Throat: Oropharynx is clear and moist. No oropharyngeal exudate.  Eyes: EOM are normal. Pupils are equal, round, and reactive to light.  Neck: Normal range of motion. Neck supple. No JVD present. No tracheal deviation present. No thyromegaly present.  Cardiovascular: Normal rate, regular rhythm and normal heart sounds. Exam reveals no gallop and no friction rub.  No murmur heard. Pulmonary/Chest: Effort normal. No respiratory distress. She has no wheezes. She has no rales. She exhibits no tenderness.  Abdominal: Soft. Bowel sounds are normal.  Genitourinary:  Genitourinary Comments: U/a showing trace WBC only.  Musculoskeletal: Normal range of motion.  Lymphadenopathy:    She has no  cervical adenopathy.  Neurological: She is alert and oriented to person, place, and time. No cranial nerve deficit.  Skin: Skin is warm and dry. She is not diaphoretic.  Psychiatric: She has a normal mood and affect. Her behavior is normal. Judgment and thought content normal.  Nursing note and vitals reviewed.   Assessment/Plan: 1. Essential hypertension Improved. Continue amlodipine at 10mg  daily.   2. Urinary tract infection with hematuria, site unspecified U/a showing just trace WBC. Will monitor closely. finishced full course augmentin since her last visit.  3. Screening for breast cancer - MM DIGITAL SCREENING BILATERAL; Future  General Counseling: glenora morocho understanding of the findings of todays visit and agrees with plan of treatment. I have discussed any further diagnostic evaluation that may be needed or ordered today. We also reviewed her medications today. she has been encouraged to call the office with any questions or concerns that should arise related to todays visit.  This patient was seen by Leretha Pol, FNP- C in Collaboration with Dr Lavera Guise as a part of collaborative care agreement    Orders Placed This Encounter  Procedures  . MM DIGITAL SCREENING BILATERAL     Time spent: 43  Minutes   Dr Lavera Guise Internal medicine

## 2018-02-06 ENCOUNTER — Other Ambulatory Visit: Payer: Self-pay | Admitting: Nurse Practitioner

## 2018-02-06 DIAGNOSIS — N390 Urinary tract infection, site not specified: Secondary | ICD-10-CM

## 2018-02-06 MED ORDER — AMOXICILLIN-POT CLAVULANATE 875-125 MG PO TABS: 1.0000 | ORAL_TABLET | Freq: Two times a day (BID) | ORAL | 0 refills | Status: DC

## 2018-02-07 ENCOUNTER — Telehealth: Payer: Self-pay

## 2018-02-07 NOTE — Telephone Encounter (Signed)
Spoke to pt and advised that abx sent to pharmacy

## 2018-02-07 NOTE — Telephone Encounter (Signed)
-----   Message from Ronnell Freshwater, NP sent at 02/06/2018  5:33 PM EST ----- Regarding: RE: pt may have UTI still Contact: [phone] Will do another round of augmentin bid for 10 days, which is what worked the last time. Sent this to her pharmacy.  ----- Message ----- From: Edd Arbour, CMA Sent: 02/06/2018   3:38 PM To: Ronnell Freshwater, NP Subject: pt may have UTI still                          Pt called stated she thinks she has a UTI bc of lower abdominal pain and back pain and having frequency.  She also says she has a odor.  She asked to let you know that her blood pressures are doing good, and she is keeping a log of them.  [facility].  dbs

## 2018-02-13 DIAGNOSIS — N281 Cyst of kidney, acquired: Secondary | ICD-10-CM | POA: Diagnosis not present

## 2018-02-13 DIAGNOSIS — N2889 Other specified disorders of kidney and ureter: Secondary | ICD-10-CM | POA: Diagnosis not present

## 2018-02-13 DIAGNOSIS — R350 Frequency of micturition: Secondary | ICD-10-CM | POA: Diagnosis not present

## 2018-02-18 ENCOUNTER — Other Ambulatory Visit: Payer: Self-pay | Admitting: Nurse Practitioner

## 2018-02-18 DIAGNOSIS — N63 Unspecified lump in unspecified breast: Secondary | ICD-10-CM

## 2018-03-03 DIAGNOSIS — L821 Other seborrheic keratosis: Secondary | ICD-10-CM | POA: Diagnosis not present

## 2018-03-03 DIAGNOSIS — D225 Melanocytic nevi of trunk: Secondary | ICD-10-CM | POA: Diagnosis not present

## 2018-03-03 DIAGNOSIS — I8393 Asymptomatic varicose veins of bilateral lower extremities: Secondary | ICD-10-CM | POA: Diagnosis not present

## 2018-03-03 DIAGNOSIS — D485 Neoplasm of uncertain behavior of skin: Secondary | ICD-10-CM | POA: Diagnosis not present

## 2018-03-03 DIAGNOSIS — L570 Actinic keratosis: Secondary | ICD-10-CM | POA: Diagnosis not present

## 2018-03-03 DIAGNOSIS — L578 Other skin changes due to chronic exposure to nonionizing radiation: Secondary | ICD-10-CM | POA: Diagnosis not present

## 2018-03-03 DIAGNOSIS — L308 Other specified dermatitis: Secondary | ICD-10-CM | POA: Diagnosis not present

## 2018-03-03 DIAGNOSIS — L820 Inflamed seborrheic keratosis: Secondary | ICD-10-CM | POA: Diagnosis not present

## 2018-03-03 DIAGNOSIS — L814 Other melanin hyperpigmentation: Secondary | ICD-10-CM | POA: Diagnosis not present

## 2018-03-03 DIAGNOSIS — L918 Other hypertrophic disorders of the skin: Secondary | ICD-10-CM | POA: Diagnosis not present

## 2018-03-03 DIAGNOSIS — Z1283 Encounter for screening for malignant neoplasm of skin: Secondary | ICD-10-CM | POA: Diagnosis not present

## 2018-03-17 ENCOUNTER — Ambulatory Visit
Admission: RE | Admit: 2018-03-17 | Discharge: 2018-03-17 | Disposition: A | Discharge status: Home / Self Care | Payer: Medicare HMO | Source: Ambulatory Visit | Attending: Nurse Practitioner | Admitting: Nurse Practitioner

## 2018-03-17 DIAGNOSIS — N6320 Unspecified lump in the left breast, unspecified quadrant: Secondary | ICD-10-CM | POA: Insufficient documentation

## 2018-03-17 DIAGNOSIS — Z1239 Encounter for other screening for malignant neoplasm of breast: Secondary | ICD-10-CM

## 2018-03-17 DIAGNOSIS — N63 Unspecified lump in unspecified breast: Secondary | ICD-10-CM | POA: Diagnosis present

## 2018-03-17 DIAGNOSIS — N6310 Unspecified lump in the right breast, unspecified quadrant: Secondary | ICD-10-CM | POA: Diagnosis not present

## 2018-03-17 DIAGNOSIS — R69 Illness, unspecified: Secondary | ICD-10-CM | POA: Diagnosis not present

## 2018-03-17 DIAGNOSIS — R928 Other abnormal and inconclusive findings on diagnostic imaging of breast: Secondary | ICD-10-CM | POA: Diagnosis not present

## 2018-04-07 ENCOUNTER — Other Ambulatory Visit: Payer: Self-pay | Admitting: Internal Medicine

## 2018-04-07 ENCOUNTER — Telehealth: Payer: Self-pay

## 2018-04-07 DIAGNOSIS — I10 Essential (primary) hypertension: Secondary | ICD-10-CM

## 2018-04-07 MED ORDER — AMLODIPINE BESYLATE 10 MG PO TABS: 10.0000 mg | ORAL_TABLET | Freq: Every day | ORAL | 2 refills | Status: DC

## 2018-04-07 NOTE — Telephone Encounter (Signed)
Pt called having diarrhea and throwing up as per heather advised pt take immodium and ginger ale ,toast and she  Stopped diarrhea when  called her back still she not drinking she need go to er or call us back

## 2018-05-24 DIAGNOSIS — M25559 Pain in unspecified hip: Secondary | ICD-10-CM | POA: Diagnosis not present

## 2018-05-24 DIAGNOSIS — Z961 Presence of intraocular lens: Secondary | ICD-10-CM | POA: Diagnosis not present

## 2018-05-24 DIAGNOSIS — I10 Essential (primary) hypertension: Secondary | ICD-10-CM | POA: Diagnosis not present

## 2018-05-24 DIAGNOSIS — M25552 Pain in left hip: Secondary | ICD-10-CM | POA: Diagnosis not present

## 2018-05-24 DIAGNOSIS — Z79899 Other long term (current) drug therapy: Secondary | ICD-10-CM | POA: Diagnosis not present

## 2018-05-24 DIAGNOSIS — M5432 Sciatica, left side: Secondary | ICD-10-CM | POA: Diagnosis not present

## 2018-05-24 DIAGNOSIS — M791 Myalgia, unspecified site: Secondary | ICD-10-CM | POA: Diagnosis not present

## 2018-05-24 DIAGNOSIS — Z9842 Cataract extraction status, left eye: Secondary | ICD-10-CM | POA: Diagnosis not present

## 2018-05-24 DIAGNOSIS — M79662 Pain in left lower leg: Secondary | ICD-10-CM | POA: Diagnosis not present

## 2018-05-24 DIAGNOSIS — M533 Sacrococcygeal disorders, not elsewhere classified: Secondary | ICD-10-CM | POA: Diagnosis not present

## 2018-05-24 DIAGNOSIS — Z90710 Acquired absence of both cervix and uterus: Secondary | ICD-10-CM | POA: Diagnosis not present

## 2018-05-24 DIAGNOSIS — Z9841 Cataract extraction status, right eye: Secondary | ICD-10-CM | POA: Diagnosis not present

## 2018-05-24 DIAGNOSIS — Z87448 Personal history of other diseases of urinary system: Secondary | ICD-10-CM | POA: Diagnosis not present

## 2018-05-24 DIAGNOSIS — Z87440 Personal history of urinary (tract) infections: Secondary | ICD-10-CM | POA: Diagnosis not present

## 2018-05-24 DIAGNOSIS — Z8669 Personal history of other diseases of the nervous system and sense organs: Secondary | ICD-10-CM | POA: Diagnosis not present

## 2018-05-25 DIAGNOSIS — M5432 Sciatica, left side: Secondary | ICD-10-CM | POA: Diagnosis not present

## 2018-05-26 ENCOUNTER — Ambulatory Visit: Payer: Medicare HMO | Admitting: Nurse Practitioner

## 2018-05-26 ENCOUNTER — Encounter: Payer: Self-pay | Admitting: Nurse Practitioner

## 2018-05-26 VITALS — BP 157/70 | HR 68 | Resp 16 | Ht <= 58 in | Wt 137.6 lb

## 2018-05-26 DIAGNOSIS — M545 Low back pain, unspecified: Secondary | ICD-10-CM

## 2018-05-26 DIAGNOSIS — I10 Essential (primary) hypertension: Secondary | ICD-10-CM

## 2018-05-26 DIAGNOSIS — Z23 Encounter for immunization: Secondary | ICD-10-CM

## 2018-05-26 DIAGNOSIS — R300 Dysuria: Secondary | ICD-10-CM | POA: Diagnosis not present

## 2018-05-26 LAB — POCT URINALYSIS DIPSTICK
BILIRUBIN UA: NEGATIVE
Blood, UA: NEGATIVE
Glucose, UA: NEGATIVE
Ketones, UA: NEGATIVE
Leukocytes, UA: NEGATIVE
Nitrite, UA: NEGATIVE
PH UA: 5 (ref 5.0–8.0)
Protein, UA: NEGATIVE
Spec Grav, UA: 1.01 (ref 1.010–1.025)
UROBILINOGEN UA: 0.2 U/dL

## 2018-05-26 MED ORDER — TETANUS-DIPHTH-ACELL PERTUSSIS 5-2-15.5 LF-MCG/0.5 IM SUSP: 0.5000 mL | Freq: Once | INTRAMUSCULAR | 0 refills | Status: AC

## 2018-05-26 NOTE — Patient Instructions (Signed)
Sciatica Sciatica is pain, numbness, weakness, or tingling along your sciatic nerve. The sciatic nerve starts in the lower back and goes down the back of each leg. Sciatica happens when this nerve is pinched or has pressure put on it. Sciatica usually goes away on its own or with treatment. Sometimes, sciatica may keep coming back (recur). Follow these instructions at home: Medicines  Take over-the-counter and prescription medicines only as told by your doctor.  Do not drive or use heavy machinery while taking prescription pain medicine. Managing pain  If directed, put ice on the affected area. ? Put ice in a plastic bag. ? Place a towel between your skin and the bag. ? Leave the ice on for 20 minutes, 2-3 times a day.  After icing, apply heat to the affected area before you exercise or as often as told by your doctor. Use the heat source that your doctor tells you to use, such as a moist heat pack or a heating pad. ? Place a towel between your skin and the heat source. ? Leave the heat on for 20-30 minutes. ? Remove the heat if your skin turns bright red. This is especially important if you are unable to feel pain, heat, or cold. You may have a greater risk of getting burned. Activity  Return to your normal activities as told by your doctor. Ask your doctor what activities are safe for you. ? Avoid activities that make your sciatica worse.  Take short rests during the day. Rest in a lying or standing position. This is usually better than sitting to rest. ? When you rest for a long time, do some physical activity or stretching between periods of rest. ? Avoid sitting for a long time without moving. Get up and move around at least one time each hour.  Exercise and stretch regularly, as told by your doctor.  Do not lift anything that is heavier than 10 lb (4.5 kg) while you have symptoms of sciatica. ? Avoid lifting heavy things even when you do not have symptoms. ? Avoid lifting heavy  things over and over.  When you lift objects, always lift in a way that is safe for your body. To do this, you should: ? Bend your knees. ? Keep the object close to your body. ? Avoid twisting. General instructions  Use good posture. ? Avoid leaning forward when you are sitting. ? Avoid hunching over when you are standing.  Stay at a healthy weight.  Wear comfortable shoes that support your feet. Avoid wearing high heels.  Avoid sleeping on a mattress that is too soft or too hard. You might have less pain if you sleep on a mattress that is firm enough to support your back.  Keep all follow-up visits as told by your doctor. This is important. Contact a doctor if:  You have pain that: ? Wakes you up when you are sleeping. ? Gets worse when you lie down. ? Is worse than the pain you have had in the past. ? Lasts longer than 4 weeks.  You lose weight for without trying. Get help right away if:  You cannot control when you pee (urinate) or poop (have a bowel movement).  You have weakness in any of these areas and it gets worse. ? Lower back. ? Lower belly (pelvis). ? Butt (buttocks). ? Legs.  You have redness or swelling of your back.  You have a burning feeling when you pee. This information is not intended to replace   advice given to you by your health care provider. Make sure you discuss any questions you have with your health care provider. Document Released: 09/18/2008 Document Revised: 05/17/2016 Document Reviewed: 08/19/2015 Elsevier Interactive Patient Education  2018 Elsevier Inc.  

## 2018-05-26 NOTE — Progress Notes (Signed)
[facility] [place], [place] [address]  Internal MEDICINE  Office Visit Note  Patient Name: Cynthia Keith  [id number]   [id number]  Date of Service: 05/26/2018   Pt is here for routine follow up.  Chief Complaint  Patient presents with  . Hip Pain    sciatica    The patient was seen in ER at [facility] this past weekend. Started with cramp in her left calf, radiated up to her left hip and lower back. Pain was so significant that she could not move her left leg. Was examined for uti, kidney stone, and issues with low back and left hip. Was discharged with low dose oxycodone, ibuprofen, and prednisone taper. She has started the prednisone taper on Saturday. Has been taking oxycodone every 4 hours and ibuprofen as needed. Pain is much better. Has started doing some easy stretches to help relieve pain. Blood pressure is elevated today. Reviewing the notes from ER visit, BP was 120/80.         Current Medication: Outpatient Encounter Medications as of 05/26/2018  Medication Sig  . ibuprofen (ADVIL,MOTRIN) 600 MG tablet Take by mouth.  Marland Kitchen oxycodone (OXY-IR) 5 MG capsule Take by mouth.  . predniSONE (DELTASONE) 20 MG tablet Take by mouth.  Marland Kitchen amLODipine (NORVASC) 10 MG tablet Take 1 tablet (10 mg total) by mouth daily.  Marland Kitchen aspirin EC 81 MG tablet Take 81 mg by mouth daily.  Marland Kitchen ibuprofen (ADVIL,MOTRIN) 200 MG tablet Take 1 tablet (200 mg total) by mouth every 6 (six) hours as needed for moderate pain. (Patient not taking: Reported on 01/13/2018)  . isosorbide mononitrate (IMDUR) 30 MG 24 hr tablet Take 1 tablet (30 mg total) by mouth daily. (Patient not taking: Reported on 01/13/2018)  . Multiple Vitamin (MULTIVITAMIN) tablet Take 1 tablet by mouth daily.  Marland Kitchen nystatin cream (MYCOSTATIN) Apply 1 application topically 2 (two) times daily.  Marland Kitchen omeprazole (PRILOSEC) 20 MG capsule Take 20 mg by mouth daily.  . [DISCONTINUED] amoxicillin-clavulanate (AUGMENTIN) 875-125 MG  tablet Take 1 tablet by mouth 2 (two) times daily. (Patient not taking: Reported on 05/26/2018)   No facility-administered encounter medications on file as of 05/26/2018.     Surgical History: Past Surgical History:  Procedure Laterality Date  . ABDOMINAL HYSTERECTOMY    . COLONOSCOPY WITH PROPOFOL N/A 12/12/2015   Procedure: COLONOSCOPY WITH PROPOFOL;  Surgeon: Manya Silvas, MD;  Location: [facility] ENDOSCOPY;  Service: Endoscopy;  Laterality: N/A;  . HAND SURGERY  2005    Medical History: Past Medical History:  Diagnosis Date  . GERD (gastroesophageal reflux disease)   . Hyperlipidemia   . Hypertension   . Plantar fasciitis   . Sleep apnea     Family History: Family History  Problem Relation Age of Onset  . Stroke Mother   . Heart disease Paternal Grandmother   . Cancer Paternal Grandfather     Social History   Socioeconomic History  . Marital status: Married    Spouse name: Not on file  . Number of children: Not on file  . Years of education: Not on file  . Highest education level: Not on file  Occupational History  . Not on file  Social Needs  . Financial resource strain: Not on file  . Food insecurity:    Worry: Not on file    Inability: Not on file  . Transportation needs:    Medical: Not on file    Non-medical: Not on file  Tobacco Use  . Smoking status: Never Smoker  . Smokeless tobacco: Never Used  Substance and Sexual Activity  . Alcohol use: Yes    Comment: ocassionally  . Drug use: No  . Sexual activity: Not on file  Lifestyle  . Physical activity:    Days per week: Not on file    Minutes per session: Not on file  . Stress: Not on file  Relationships  . Social connections:    Talks on phone: Not on file    Gets together: Not on file    Attends religious service: Not on file    Active member of club or organization: Not on file    Attends meetings of clubs or organizations: Not on file    Relationship status: Not on file  . Intimate partner  violence:    Fear of current or ex partner: Not on file    Emotionally abused: Not on file    Physically abused: Not on file    Forced sexual activity: Not on file  Other Topics Concern  . Not on file  Social History Narrative  . Not on file      Review of Systems  Constitutional: Negative for chills, fatigue and unexpected weight change.  HENT: Negative for congestion, postnasal drip, rhinorrhea, sneezing and sore throat.   Eyes: Negative for redness.  Respiratory: Negative for cough, chest tightness and shortness of breath.   Cardiovascular: Negative for chest pain and palpitations.       Improved bp since increased amlodipine   Gastrointestinal: Negative for abdominal pain, constipation, diarrhea, nausea and vomiting.  Endocrine: Negative for cold intolerance, heat intolerance, polydipsia, polyphagia and polyuria.  Genitourinary: Positive for flank pain. Negative for dysuria and frequency.  Musculoskeletal: Positive for arthralgias and back pain. Negative for joint swelling and neck pain.       Left hip pain  Skin: Negative for rash.  Allergic/Immunologic: Negative for environmental allergies.  Neurological: Negative.  Negative for tremors and numbness.  Hematological: Negative for adenopathy. Does not bruise/bleed easily.  Psychiatric/Behavioral: Negative for behavioral problems (Depression), sleep disturbance and suicidal ideas. The patient is nervous/anxious.     Today's Vitals   05/26/18 1009  BP: (!) 157/70  Pulse: 68  Resp: 16  SpO2: 100%  Weight: 137 lb 9.6 oz (62.4 kg)  Height: 4\' 9"  (1.448 m)    Physical Exam  Constitutional: She is oriented to person, place, and time. She appears well-developed and well-nourished. No distress.  HENT:  Head: Normocephalic and atraumatic.  Mouth/Throat: Oropharynx is clear and moist. No oropharyngeal exudate.  Eyes: Pupils are equal, round, and reactive to light. EOM are normal.  Neck: Normal range of motion. Neck supple. No  JVD present. Carotid bruit is not present. No tracheal deviation present. No thyromegaly present.  Cardiovascular: Normal rate, regular rhythm and normal heart sounds. Exam reveals no gallop and no friction rub.  No murmur heard. Pulmonary/Chest: Effort normal and breath sounds normal. No respiratory distress. She has no wheezes. She has no rales. She exhibits no tenderness.  Abdominal: Soft. Bowel sounds are normal. There is no tenderness.  Genitourinary:  Genitourinary Comments: U/a negative for WBC, blood, or other abnormalities   Musculoskeletal: Normal range of motion.  Low back pain and left hip pain. Improved since her visit to ER and starting oxycodone and prednisone taper.   Lymphadenopathy:    She has no cervical adenopathy.  Neurological: She is alert and oriented to person, place, and time. No cranial  nerve deficit.  Skin: Skin is warm and dry. She is not diaphoretic.  Psychiatric: She has a normal mood and affect. Her behavior is normal. Judgment and thought content normal.  Nursing note and vitals reviewed.  Assessment/Plan: 1. Acute left-sided low back pain without sciatica - POCT Urinalysis Dipstick negative for any abnormalities today. Continue prednisone taper as prescribed per ER. Recommended she begin to cut back use of prescribed oxycodone to only as needed. She should also use ibuprofen as needed and as prescribed.   2. Essential hypertension Generally stable. Continue bp medication as prescribed.   3. Need for prophylactic vaccination against diphtheria-tetanus-pertussis (DTP) - Tdap (ADACEL) 04-24-14.5 LF-MCG/0.5 injection; Inject 0.5 mLs into the muscle once for 1 dose.  Dispense: 0.5 mL; Refill: 0  4. Need for vaccination against Streptococcus pneumoniae using pneumococcal conjugate vaccine 13 - Pneumococcal conjugate vaccine 13-valent IM  General Counseling: Cynthia Keith verbalizes understanding of the findings of todays visit and agrees with plan of treatment. I have  discussed any further diagnostic evaluation that may be needed or ordered today. We also reviewed her medications today. she has been encouraged to call the office with any questions or concerns that should arise related to todays visit.   Counseling:  This patient was seen by Leretha Pol, FNP- C in Collaboration with Dr Lavera Guise as a part of collaborative care agreement  Orders Placed This Encounter  Procedures  . POCT Urinalysis Dipstick       Time spent: 68 Minutes     Dr Lavera Guise Internal medicine

## 2018-06-30 ENCOUNTER — Other Ambulatory Visit: Payer: Self-pay

## 2018-06-30 DIAGNOSIS — I10 Essential (primary) hypertension: Secondary | ICD-10-CM

## 2018-06-30 MED ORDER — AMLODIPINE BESYLATE 10 MG PO TABS: 10.0000 mg | ORAL_TABLET | Freq: Every day | ORAL | 2 refills | Status: DC

## 2018-08-20 ENCOUNTER — Other Ambulatory Visit: Payer: Self-pay

## 2018-08-20 MED ORDER — OMEPRAZOLE 20 MG PO CPDR: 20.0000 mg | DELAYED_RELEASE_CAPSULE | Freq: Every day | ORAL | 3 refills | Status: DC

## 2018-09-22 ENCOUNTER — Other Ambulatory Visit: Payer: Self-pay

## 2018-09-22 DIAGNOSIS — I10 Essential (primary) hypertension: Secondary | ICD-10-CM

## 2018-09-22 MED ORDER — AMLODIPINE BESYLATE 10 MG PO TABS: 10.0000 mg | ORAL_TABLET | Freq: Every day | ORAL | 5 refills | Status: DC

## 2018-09-29 DIAGNOSIS — R69 Illness, unspecified: Secondary | ICD-10-CM | POA: Diagnosis not present

## 2018-10-20 DIAGNOSIS — H1859 Other hereditary corneal dystrophies: Secondary | ICD-10-CM | POA: Diagnosis not present

## 2018-10-20 DIAGNOSIS — Z961 Presence of intraocular lens: Secondary | ICD-10-CM | POA: Diagnosis not present

## 2018-11-17 ENCOUNTER — Encounter: Payer: Self-pay | Admitting: Nurse Practitioner

## 2018-11-17 ENCOUNTER — Ambulatory Visit (INDEPENDENT_AMBULATORY_CARE_PROVIDER_SITE_OTHER): Payer: Medicare HMO | Admitting: Nurse Practitioner

## 2018-11-17 VITALS — BP 167/84 | HR 68 | Resp 16 | Ht <= 58 in | Wt 136.2 lb

## 2018-11-17 DIAGNOSIS — Z23 Encounter for immunization: Secondary | ICD-10-CM | POA: Diagnosis not present

## 2018-11-17 DIAGNOSIS — R1011 Right upper quadrant pain: Secondary | ICD-10-CM

## 2018-11-17 DIAGNOSIS — I10 Essential (primary) hypertension: Secondary | ICD-10-CM

## 2018-11-17 DIAGNOSIS — R300 Dysuria: Secondary | ICD-10-CM | POA: Diagnosis not present

## 2018-11-17 DIAGNOSIS — Z0001 Encounter for general adult medical examination with abnormal findings: Secondary | ICD-10-CM

## 2018-11-17 DIAGNOSIS — R69 Illness, unspecified: Secondary | ICD-10-CM | POA: Diagnosis not present

## 2018-11-17 NOTE — Progress Notes (Signed)
[facility] [place], [place] [address]  Internal MEDICINE  Office Visit Note  Patient Name: Cynthia Keith  [id number]  [id number]  Date of Service: 11/19/2018  Chief Complaint  Patient presents with  . Medical Management of Chronic Issues    6 month well visit,  . Quality Metric Gaps    quality assessment form, flu vaccine     Patient arrives for her 6 months wellness checkup. Patient states that she started having RUQ abdominal pain about month ago. The pain is 3/10 lasts about 10 minutes after meals. Patient does not take anything for pain and it resolves on its own. Pain radiates to the back. Patient does no noticed the difference in pain quality during the day vs. night. Patient has elevated BP today. She states she takes her medications as prescribed. Patient advised to return in 4-6 weeks for reevaluation and medication adjustment. Patient does not have any addition questions or concerns at this time.  Pt is here for routine health maintenance examination  Current Medication: Outpatient Encounter Medications as of 11/17/2018  Medication Sig  . amLODipine (NORVASC) 10 MG tablet Take 1 tablet (10 mg total) by mouth daily.  Marland Kitchen aspirin EC 81 MG tablet Take 81 mg by mouth daily.  . Multiple Vitamin (MULTIVITAMIN) tablet Take 1 tablet by mouth daily.  Marland Kitchen omeprazole (PRILOSEC) 20 MG capsule Take 1 capsule (20 mg total) by mouth daily.  Marland Kitchen ibuprofen (ADVIL,MOTRIN) 200 MG tablet Take 1 tablet (200 mg total) by mouth every 6 (six) hours as needed for moderate pain. (Patient not taking: Reported on 01/13/2018)  . ibuprofen (ADVIL,MOTRIN) 600 MG tablet Take by mouth.  . isosorbide mononitrate (IMDUR) 30 MG 24 hr tablet Take 1 tablet (30 mg total) by mouth daily. (Patient not taking: Reported on 01/13/2018)  . nystatin cream (MYCOSTATIN) Apply 1 application topically 2 (two) times daily.   No facility-administered encounter medications on file as of 11/17/2018.      Surgical History: Past Surgical History:  Procedure Laterality Date  . ABDOMINAL HYSTERECTOMY    . COLONOSCOPY WITH PROPOFOL N/A 12/12/2015   Procedure: COLONOSCOPY WITH PROPOFOL;  Surgeon: Manya Silvas, MD;  Location: [facility] ENDOSCOPY;  Service: Endoscopy;  Laterality: N/A;  . HAND SURGERY  2005    Medical History: Past Medical History:  Diagnosis Date  . GERD (gastroesophageal reflux disease)   . Hyperlipidemia   . Hypertension   . Plantar fasciitis   . Sleep apnea     Family History: Family History  Problem Relation Age of Onset  . Stroke Mother   . Heart disease Paternal Grandmother   . Cancer Paternal Grandfather       Review of Systems  Constitutional: Negative for activity change, appetite change, chills, fatigue and fever.  HENT: Positive for dental problem. Negative for congestion, ear discharge, ear pain, facial swelling, postnasal drip, rhinorrhea, sinus pressure, sinus pain, sore throat, trouble swallowing and voice change.        Broken tooth on the left side, pt sees dentist today  Eyes: Negative.  Negative for photophobia, pain, discharge and visual disturbance.  Respiratory: Negative for apnea, cough, choking, chest tightness, shortness of breath and wheezing.   Cardiovascular: Negative for chest pain, palpitations and leg swelling.       Blood pressure a little elevated this mis morning.   Gastrointestinal: Negative for abdominal distention, abdominal pain, blood in stool, constipation, diarrhea, nausea and vomiting.  Endocrine: Negative for cold intolerance, heat intolerance,  polydipsia, polyphagia and polyuria.  Genitourinary: Negative for decreased urine volume, dysuria, flank pain, frequency, hematuria, pelvic pain, urgency, vaginal bleeding, vaginal discharge and vaginal pain.  Musculoskeletal: Positive for back pain. Negative for arthralgias, joint swelling and neck pain.       Arthritis in lower back  Skin: Negative for color change, rash and  wound.  Allergic/Immunologic: Negative for environmental allergies.  Neurological: Negative for facial asymmetry and weakness.  Hematological: Negative for adenopathy. Does not bruise/bleed easily.  Psychiatric/Behavioral: Negative for dysphoric mood. The patient is not nervous/anxious.      Today's Vitals   11/17/18 1141  BP: (!) 167/84  Pulse: 68  Resp: 16  SpO2: 96%  Weight: 136 lb 3.2 oz (61.8 kg)  Height: 4' 9.5" (1.461 m)    Physical Exam  Constitutional: She is oriented to person, place, and time. She appears well-developed and well-nourished. No distress.  HENT:  Head: Normocephalic and atraumatic.  Right Ear: External ear normal.  Left Ear: External ear normal.  Nose: Nose normal.  Mouth/Throat: Oropharynx is clear and moist. No oropharyngeal exudate.  Eyes: Pupils are equal, round, and reactive to light. Conjunctivae and EOM are normal. Right eye exhibits no discharge. Left eye exhibits no discharge. No scleral icterus.  Neck: Normal range of motion. Neck supple. No JVD present. No tracheal deviation present. No thyromegaly present.  Cardiovascular: Normal rate, regular rhythm, normal heart sounds and intact distal pulses. Exam reveals no gallop and no friction rub.  No murmur heard. Pulmonary/Chest: Effort normal and breath sounds normal. No respiratory distress. She has no rales. She exhibits no tenderness. Right breast exhibits no inverted nipple, no mass, no nipple discharge, no skin change and no tenderness. Left breast exhibits no inverted nipple, no mass, no nipple discharge, no skin change and no tenderness.  Abdominal: Soft. Bowel sounds are normal. She exhibits no distension. There is tenderness. There is no guarding.  Musculoskeletal: Normal range of motion. She exhibits no edema, tenderness or deformity.  Lymphadenopathy:    She has no cervical adenopathy.  Neurological: She is alert and oriented to person, place, and time.  Skin: Skin is warm and dry.  Capillary refill takes less than 2 seconds. No rash noted. She is not diaphoretic. No erythema.  Psychiatric: She has a normal mood and affect. Her behavior is normal. Judgment and thought content normal.  Nursing note and vitals reviewed.   Depression screen [facility] 2/9 11/17/2018 01/27/2018  Decreased Interest 0 0  Down, Depressed, Hopeless 0 0  PHQ - 2 Score 0 0    Functional Status Survey: Is the patient deaf or have difficulty hearing?: No Does the patient have difficulty seeing, even when wearing glasses/contacts?: No Does the patient have difficulty concentrating, remembering, or making decisions?: No Does the patient have difficulty walking or climbing stairs?: No Does the patient have difficulty dressing or bathing?: No Does the patient have difficulty doing errands alone such as visiting a doctor's office or shopping?: No  MMSE - [place] Exam 11/19/2018  Orientation to time 5  Orientation to Place 5  Registration 3  Attention/ Calculation 5  Recall 3  Language- name 2 objects 2  Language- repeat 1  Language- follow 3 step command 3  Language- read & follow direction 1  Write a sentence 1  Copy design 1  Total score 30    Fall Risk  11/17/2018 01/27/2018  Falls in the past year? 0 No    Assessment/Plan: 1. Encounter for general adult  medical examination with abnormal findings Annual health maintenance exam today  2. Recurrent right upper quadrant abdominal pain Will get ultrasound of right upper quadrant of the abdomen for further evaluation.  - US Abdomen Limited RUQ; Future  3. Essential hypertension Generally stable. Continue bp medication as prescribed   4. Need for vaccination against Streptococcus pneumoniae using pneumococcal conjugate vaccine 13 - Pneumococcal conjugate vaccine 13-valent IM  5. Dysuria - UA/M w/rflx Culture, Routine  General Counseling: tensley wery understanding of the findings of todays visit and agrees with plan of  treatment. I have discussed any further diagnostic evaluation that may be needed or ordered today. We also reviewed her medications today. she has been encouraged to call the office with any questions or concerns that should arise related to todays visit.    Counseling:  Hypertension Counseling:   The following hypertensive lifestyle modification were recommended and discussed:  1. Limiting alcohol intake to less than 1 oz/day of ethanol:(24 oz of beer or 8 oz of wine or 2 oz of 100-proof whiskey). 2. Take baby ASA 81 mg daily. 3. Importance of regular aerobic exercise and losing weight. 4. Reduce dietary saturated fat and cholesterol intake for overall cardiovascular health. 5. Maintaining adequate dietary potassium, calcium, and magnesium intake. 6. Regular monitoring of the blood pressure. 7. Reduce sodium intake to less than 100 mmol/day (less than 2.3 gm of sodium or less than 6 gm of sodium choride)   This patient was seen by [place] with Dr Lavera Guise as a part of collaborative care agreement  Orders Placed This Encounter  Procedures  . Microscopic Examination  . US Abdomen Limited RUQ  . Pneumococcal conjugate vaccine 13-valent IM  . UA/M w/rflx Culture, Routine      Time spent:  [place], MD  Internal Medicine

## 2018-11-19 DIAGNOSIS — R1011 Right upper quadrant pain: Secondary | ICD-10-CM | POA: Insufficient documentation

## 2018-11-19 DIAGNOSIS — Z0001 Encounter for general adult medical examination with abnormal findings: Principal | ICD-10-CM

## 2018-11-19 DIAGNOSIS — Z13820 Encounter for screening for osteoporosis: Secondary | ICD-10-CM | POA: Insufficient documentation

## 2018-11-21 LAB — UA/M W/RFLX CULTURE, ROUTINE
Bilirubin, UA: NEGATIVE
GLUCOSE, UA: NEGATIVE
Ketones, UA: NEGATIVE
NITRITE UA: NEGATIVE
PROTEIN UA: NEGATIVE
RBC, UA: NEGATIVE
Specific Gravity, UA: 1.006 (ref 1.005–1.030)
UUROB: 0.2 mg/dL (ref 0.2–1.0)
pH, UA: 7 (ref 5.0–7.5)

## 2018-11-21 LAB — MICROSCOPIC EXAMINATION: Casts: NONE SEEN /lpf

## 2018-11-21 LAB — URINE CULTURE, REFLEX

## 2018-11-24 ENCOUNTER — Other Ambulatory Visit: Payer: Self-pay | Admitting: Nurse Practitioner

## 2018-11-24 DIAGNOSIS — R69 Illness, unspecified: Secondary | ICD-10-CM | POA: Diagnosis not present

## 2018-11-24 DIAGNOSIS — Z0001 Encounter for general adult medical examination with abnormal findings: Secondary | ICD-10-CM | POA: Diagnosis not present

## 2018-11-24 DIAGNOSIS — I10 Essential (primary) hypertension: Secondary | ICD-10-CM | POA: Diagnosis not present

## 2018-11-24 DIAGNOSIS — E559 Vitamin D deficiency, unspecified: Secondary | ICD-10-CM | POA: Diagnosis not present

## 2018-11-25 LAB — COMPREHENSIVE METABOLIC PANEL
ALBUMIN: 4.5 g/dL (ref 3.5–4.8)
ALT: 24 IU/L (ref 0–32)
AST: 34 IU/L (ref 0–40)
Albumin/Globulin Ratio: 1.6 (ref 1.2–2.2)
Alkaline Phosphatase: 71 IU/L (ref 39–117)
BUN / CREAT RATIO: 17 (ref 12–28)
BUN: 13 mg/dL (ref 8–27)
Bilirubin Total: 0.4 mg/dL (ref 0.0–1.2)
CO2: 25 mmol/L (ref 20–29)
Calcium: 10.1 mg/dL (ref 8.7–10.3)
Chloride: 105 mmol/L (ref 96–106)
Creatinine, Ser: 0.77 mg/dL (ref 0.57–1.00)
GFR calc Af Amer: 89 mL/min/{1.73_m2} (ref >59)
GFR calc non Af Amer: 77 mL/min/{1.73_m2} (ref >59)
GLOBULIN, TOTAL: 2.8 g/dL (ref 1.5–4.5)
Glucose: 102 mg/dL — ABNORMAL HIGH (ref 65–99)
Potassium: 4.6 mmol/L (ref 3.5–5.2)
SODIUM: 145 mmol/L — AB (ref 134–144)
Total Protein: 7.3 g/dL (ref 6.0–8.5)

## 2018-11-25 LAB — T4, FREE: Free T4: 1.27 ng/dL (ref 0.82–1.77)

## 2018-11-25 LAB — LIPID PANEL W/O CHOL/HDL RATIO
Cholesterol, Total: 245 mg/dL — ABNORMAL HIGH (ref 100–199)
HDL: 58 mg/dL (ref >39)
LDL CALC: 172 mg/dL — AB (ref 0–99)
Triglycerides: 73 mg/dL (ref 0–149)
VLDL Cholesterol Cal: 15 mg/dL (ref 5–40)

## 2018-11-25 LAB — CBC
Hematocrit: 41.7 % (ref 34.0–46.6)
Hemoglobin: 14.1 g/dL (ref 11.1–15.9)
MCH: 31.3 pg (ref 26.6–33.0)
MCHC: 33.8 g/dL (ref 31.5–35.7)
MCV: 93 fL (ref 79–97)
PLATELETS: 265 10*3/uL (ref 150–450)
RBC: 4.51 x10E6/uL (ref 3.77–5.28)
RDW: 12.7 % (ref 12.3–15.4)
WBC: 5.6 10*3/uL (ref 3.4–10.8)

## 2018-11-25 LAB — VITAMIN D 25 HYDROXY (VIT D DEFICIENCY, FRACTURES): VIT D 25 HYDROXY: 37.2 ng/mL (ref 30.0–100.0)

## 2018-11-25 LAB — TSH: TSH: 2.29 u[IU]/mL (ref 0.450–4.500)

## 2018-11-25 LAB — T3: T3 TOTAL: 119 ng/dL (ref 71–180)

## 2018-11-26 ENCOUNTER — Telehealth: Payer: Self-pay

## 2018-11-26 NOTE — Telephone Encounter (Signed)
-----   Message from Ronnell Freshwater, NP sent at 11/25/2018  4:35 PM EST ----- Please let the patient know that, in general, labs are good. Thanks.

## 2018-11-26 NOTE — Telephone Encounter (Signed)
Left message on pt voicemail

## 2018-12-08 DIAGNOSIS — R69 Illness, unspecified: Secondary | ICD-10-CM | POA: Diagnosis not present

## 2018-12-12 ENCOUNTER — Ambulatory Visit: Payer: Medicare HMO

## 2018-12-12 DIAGNOSIS — R1011 Right upper quadrant pain: Secondary | ICD-10-CM

## 2018-12-29 ENCOUNTER — Ambulatory Visit (INDEPENDENT_AMBULATORY_CARE_PROVIDER_SITE_OTHER): Payer: Medicare HMO | Admitting: Nurse Practitioner

## 2018-12-29 ENCOUNTER — Encounter: Payer: Self-pay | Admitting: Nurse Practitioner

## 2018-12-29 VITALS — BP 146/78 | HR 97 | Resp 16 | Ht <= 58 in | Wt 134.8 lb

## 2018-12-29 DIAGNOSIS — K219 Gastro-esophageal reflux disease without esophagitis: Secondary | ICD-10-CM | POA: Diagnosis not present

## 2018-12-29 DIAGNOSIS — I10 Essential (primary) hypertension: Secondary | ICD-10-CM | POA: Diagnosis not present

## 2018-12-29 DIAGNOSIS — R1011 Right upper quadrant pain: Secondary | ICD-10-CM | POA: Diagnosis not present

## 2018-12-29 MED ORDER — OMEPRAZOLE 20 MG PO CPDR: 20.0000 mg | DELAYED_RELEASE_CAPSULE | Freq: Two times a day (BID) | ORAL | 3 refills | Status: DC

## 2018-12-29 NOTE — Progress Notes (Signed)
[facility], [place] [address]  Internal MEDICINE  Office Visit Note  Patient Name: Cynthia Keith  [id number]  [id number]  Date of Service: 12/30/2018  Chief Complaint  Patient presents with  . Medical Management of Chronic Issues    6wk follow up, pt have concerns that sometimes when she eats she still have stomach pains and wonders if it could be indigestion  . Hypertension  . Labs Only    review ultrasound    The patient is here for follow up visit. She had been having intermittent abdominal pain. Patient states that she started having RUQ abdominal pain which started about two months ago. The pain is 3/10 lasts about 10 minutes after meals. Patient does not take anything for pain and it resolves on its own. Pain radiates to the back. Patient does not notice the difference in pain quality during the day vs. night. She states that certain foods will make it worse, especially when she snacks at night. Gradually improving. Has not had diarrhea. Does have increased amounts of gas. She had ultrasound of the abdomen for further evaluation. This did show some fatty deposits on the liver but was without other abnormalities.   Bp improved today. She states she takes her medications as prescribed. Patient advised to return in 4-6 weeks for reevaluation and medication adjustment. Patient does not have any addition questions or concerns at this time.      Current Medication: Outpatient Encounter Medications as of 12/29/2018  Medication Sig  . amLODipine (NORVASC) 10 MG tablet Take 1 tablet (10 mg total) by mouth daily.  Marland Kitchen aspirin EC 81 MG tablet Take 81 mg by mouth daily.  Marland Kitchen ibuprofen (ADVIL,MOTRIN) 600 MG tablet Take by mouth.  . Multiple Vitamin (MULTIVITAMIN) tablet Take 1 tablet by mouth daily.  Marland Kitchen nystatin cream (MYCOSTATIN) Apply 1 application topically 2 (two) times daily.  Marland Kitchen omeprazole (PRILOSEC) 20 MG capsule Take 1 capsule (20 mg total) by mouth 2  (two) times daily before a meal.  . [DISCONTINUED] omeprazole (PRILOSEC) 20 MG capsule Take 1 capsule (20 mg total) by mouth daily.  Marland Kitchen ibuprofen (ADVIL,MOTRIN) 200 MG tablet Take 1 tablet (200 mg total) by mouth every 6 (six) hours as needed for moderate pain. (Patient not taking: Reported on 01/13/2018)  . isosorbide mononitrate (IMDUR) 30 MG 24 hr tablet Take 1 tablet (30 mg total) by mouth daily. (Patient not taking: Reported on 01/13/2018)   No facility-administered encounter medications on file as of 12/29/2018.     Surgical History: Past Surgical History:  Procedure Laterality Date  . ABDOMINAL HYSTERECTOMY    . COLONOSCOPY WITH PROPOFOL N/A 12/12/2015   Procedure: COLONOSCOPY WITH PROPOFOL;  Surgeon: Manya Silvas, MD;  Location: [facility] ENDOSCOPY;  Service: Endoscopy;  Laterality: N/A;  . HAND SURGERY  2005    Medical History: Past Medical History:  Diagnosis Date  . GERD (gastroesophageal reflux disease)   . Hyperlipidemia   . Hypertension   . Plantar fasciitis   . Sleep apnea     Family History: Family History  Problem Relation Age of Onset  . Stroke Mother   . Heart disease Paternal Grandmother   . Cancer Paternal Grandfather     Social History   Socioeconomic History  . Marital status: Married    Spouse name: Not on file  . Number of children: Not on file  . Years of education: Not on file  . Highest education level: Not on file  Occupational History  . Not on file  Social Needs  . Financial resource strain: Not on file  . Food insecurity:    Worry: Not on file    Inability: Not on file  . Transportation needs:    Medical: Not on file    Non-medical: Not on file  Tobacco Use  . Smoking status: Never Smoker  . Smokeless tobacco: Never Used  Substance and Sexual Activity  . Alcohol use: Yes    Comment: ocassionally  . Drug use: No  . Sexual activity: Not on file  Lifestyle  . Physical activity:    Days per week: Not on file    Minutes per session:  Not on file  . Stress: Not on file  Relationships  . Social connections:    Talks on phone: Not on file    Gets together: Not on file    Attends religious service: Not on file    Active member of club or organization: Not on file    Attends meetings of clubs or organizations: Not on file    Relationship status: Not on file  . Intimate partner violence:    Fear of current or ex partner: Not on file    Emotionally abused: Not on file    Physically abused: Not on file    Forced sexual activity: Not on file  Other Topics Concern  . Not on file  Social History Narrative  . Not on file      Review of Systems  Constitutional: Negative for activity change, appetite change, chills, fatigue and fever.  HENT: Negative for congestion, dental problem, ear discharge, ear pain, facial swelling, postnasal drip, rhinorrhea, sinus pressure, sinus pain, sore throat, trouble swallowing and voice change.   Eyes: Negative.  Negative for photophobia, pain, discharge and visual disturbance.  Respiratory: Negative for apnea, cough, choking, chest tightness, shortness of breath and wheezing.   Cardiovascular: Negative for chest pain, palpitations and leg swelling.       Blood pressure a little elevated this mis morning.   Gastrointestinal: Positive for abdominal pain. Negative for abdominal distention, blood in stool, constipation, diarrhea, nausea and vomiting.       Intermittent abdominal discomfort associated with eating. Starts about 10 minutes after eating and resolves on it's own. Ha gradually improved since she was last seen.   Endocrine: Negative for cold intolerance, heat intolerance, polydipsia and polyuria.  Genitourinary: Negative for vaginal pain.  Musculoskeletal: Positive for back pain. Negative for arthralgias, joint swelling and neck pain.       Arthritis in lower back  Skin: Negative for color change, rash and wound.  Allergic/Immunologic: Negative for environmental allergies.   Neurological: Negative for dizziness, facial asymmetry, weakness and headaches.  Hematological: Negative for adenopathy. Does not bruise/bleed easily.  Psychiatric/Behavioral: Negative for dysphoric mood. The patient is not nervous/anxious.     Today's Vitals   12/29/18 1150  BP: (!) 146/78  Pulse: 97  Resp: 16  SpO2: 98%  Weight: 134 lb 12.8 oz (61.1 kg)  Height: 4' 9.5" (1.461 m)    Physical Exam Vitals signs and nursing note reviewed.  Constitutional:      General: She is not in acute distress.    Appearance: Normal appearance. She is well-developed. She is not diaphoretic.  HENT:     Head: Normocephalic and atraumatic.     Mouth/Throat:     Pharynx: No oropharyngeal exudate.  Eyes:     Pupils: Pupils are equal, round, and reactive to  light.  Neck:     Musculoskeletal: Normal range of motion and neck supple.     Thyroid: No thyromegaly.     Vascular: No carotid bruit or JVD.     Trachea: No tracheal deviation.  Cardiovascular:     Rate and Rhythm: Normal rate and regular rhythm.     Heart sounds: Normal heart sounds. No murmur. No friction rub. No gallop.   Pulmonary:     Effort: Pulmonary effort is normal. No respiratory distress.     Breath sounds: Normal breath sounds. No wheezing or rales.  Chest:     Chest wall: No tenderness.  Abdominal:     General: Bowel sounds are normal.     Palpations: Abdomen is soft.     Tenderness: There is no abdominal tenderness.  Musculoskeletal: Normal range of motion.  Lymphadenopathy:     Cervical: No cervical adenopathy.  Skin:    General: Skin is warm and dry.  Neurological:     Mental Status: She is alert and oriented to person, place, and time.     Cranial Nerves: No cranial nerve deficit.  Psychiatric:        Behavior: Behavior normal.        Thought Content: Thought content normal.        Judgment: Judgment normal.   Assessment/Plan: 1. Recurrent right upper quadrant abdominal pain Reviewed ultrasound of abdomen  which showed some fatty liver but no other abnormalities. Pain improving. Will continue to monitor.   2. Gastroesophageal reflux disease without esophagitis Abdominal discomfort possibly related to worsening GERD. Will increase omeprazole 20mg  to twice daily. Advised her to limit exposure to spicy foods and foods that make symptoms worse. Consider sleeping with HOB raised 30 degrees.  - omeprazole (PRILOSEC) 20 MG capsule; Take 1 capsule (20 mg total) by mouth 2 (two) times daily before a meal.  Dispense: 60 capsule; Refill: 3  3. Essential hypertension Improved today. Continue blood pressure medications as prescribed.   General Counseling: isabeau mccalla understanding of the findings of todays visit and agrees with plan of treatment. I have discussed any further diagnostic evaluation that may be needed or ordered today. We also reviewed her medications today. she has been encouraged to call the office with any questions or concerns that should arise related to todays visit.  Hypertension Counseling:   The following hypertensive lifestyle modification were recommended and discussed:  1. Limiting alcohol intake to less than 1 oz/day of ethanol:(24 oz of beer or 8 oz of wine or 2 oz of 100-proof whiskey). 2. Take baby ASA 81 mg daily. 3. Importance of regular aerobic exercise and losing weight. 4. Reduce dietary saturated fat and cholesterol intake for overall cardiovascular health. 5. Maintaining adequate dietary potassium, calcium, and magnesium intake. 6. Regular monitoring of the blood pressure. 7. Reduce sodium intake to less than 100 mmol/day (less than 2.3 gm of sodium or less than 6 gm of sodium choride)   This patient was seen by [facility] with Dr Lavera Guise as a part of collaborative care agreement  Meds ordered this encounter  Medications  . omeprazole (PRILOSEC) 20 MG capsule    Sig: Take 1 capsule (20 mg total) by mouth 2 (two) times daily before a  meal.    Dispense:  60 capsule    Refill:  3    Increased to twice daily.    Order Specific Question:   Supervising Provider    Answer:   Clayborn Bigness  M [[id number]]    Time spent: 25 Minutes      Dr Lavera Guise Internal medicine

## 2019-01-08 ENCOUNTER — Telehealth: Payer: Self-pay

## 2019-01-08 DIAGNOSIS — J029 Acute pharyngitis, unspecified: Secondary | ICD-10-CM | POA: Diagnosis not present

## 2019-01-08 DIAGNOSIS — R05 Cough: Secondary | ICD-10-CM | POA: Diagnosis not present

## 2019-01-08 NOTE — Telephone Encounter (Signed)
Pt called that she having 100.4 fever and coughing I offer to come in today at she is unable to come in so I advised her go to urgent care as per [facility]

## 2019-01-26 ENCOUNTER — Ambulatory Visit: Payer: Self-pay | Admitting: Urology

## 2019-02-09 DIAGNOSIS — Z09 Encounter for follow-up examination after completed treatment for conditions other than malignant neoplasm: Secondary | ICD-10-CM | POA: Diagnosis not present

## 2019-02-09 DIAGNOSIS — N816 Rectocele: Secondary | ICD-10-CM | POA: Diagnosis not present

## 2019-02-13 ENCOUNTER — Ambulatory Visit: Payer: Self-pay | Admitting: Urology

## 2019-02-13 NOTE — Progress Notes (Signed)
02/16/2019  10:06 AM   Cynthia Keith [birth year] [id number]  Referring provider: Lavera Keith, [address] [place], [place] [address]  Chief Complaint  Patient presents with  . Establish Care   Urologic History 1. History of Right Renal Mass  - Relatively unchanged since 2009 imaging  - Retroperitoneal (01/2018) with hypoehoic ill-defined upper right pole renal mass (1.6 x 1.8 x 1.0 cm, prev. 1.6 x 1.3 x 1.3)  - Previously followed by Dr. Jacqlyn Keith at [facility]. Last seen 01/2018.  - Last imaging: [place] Abd [place] RUQ (11/2018) with limited views of right kidney, no gross abnormalities, left kidney nonvisualized   HPI: Cynthia Keith is a 73 y.o. White or Caucasian female that presents today to establish care.  -Incidentally found to have a 2 cm right renal mass on spinal MRI in 2009.  MRI abdomen with and without showed a 2 cm enhancing right renal mass.  -She has been on surveillance since 2009 and the mass has actually decreased in size by ultrasound.  - No bothersome urinary symptoms  - Denies dysuria, gross hematuria or flank/abdominal/pelvic pain.   PMH: Past Medical History:  Diagnosis Date  . GERD (gastroesophageal reflux disease)   . Hyperlipidemia   . Hypertension   . Plantar fasciitis   . Sleep apnea     Surgical History: Past Surgical History:  Procedure Laterality Date  . ABDOMINAL HYSTERECTOMY    . COLONOSCOPY WITH PROPOFOL N/A 12/12/2015   Procedure: COLONOSCOPY WITH PROPOFOL;  Surgeon: Cynthia Silvas, MD;  Location: [facility] ENDOSCOPY;  Service: Endoscopy;  Laterality: N/A;  . HAND SURGERY  2005    Home Medications:  Allergies as of 02/16/2019      Reactions   Iodinated Diagnostic Agents Anaphylaxis   Remote hx of respiratory arrest after IV iodine for kidney study. Immediately treated w/o sequela. No previous or subsequent IVP exposure.   Iodine Shortness Of Breath   "Quit Breathing"      Medication List       Accurate as of February 16, 2019 10:06 AM.  Always use your most recent med list.        amLODipine 10 MG tablet Commonly known as:  NORVASC Take 1 tablet (10 mg total) by mouth daily.   aspirin EC 81 MG tablet Take 81 mg by mouth daily.   ibuprofen 200 MG tablet Commonly known as:  ADVIL,MOTRIN Take 1 tablet (200 mg total) by mouth every 6 (six) hours as needed for moderate pain.   ibuprofen 600 MG tablet Commonly known as:  ADVIL,MOTRIN Take by mouth.   multivitamin tablet Take 1 tablet by mouth daily.   nystatin cream Commonly known as:  MYCOSTATIN Apply 1 application topically 2 (two) times daily.   omeprazole 20 MG capsule Commonly known as:  PRILOSEC Take 1 capsule (20 mg total) by mouth 2 (two) times daily before a meal.       Allergies:  Allergies  Allergen Reactions  . Iodinated Diagnostic Agents Anaphylaxis    Remote hx of respiratory arrest after IV iodine for kidney study. Immediately treated w/o sequela. No previous or subsequent IVP exposure.  . Iodine Shortness Of Breath    "Quit Breathing"    Family History: Family History  Problem Relation Age of Onset  . Stroke Mother   . Heart disease Paternal Grandmother   . Cancer Paternal Grandfather    Social History:  reports that she has never smoked. She has never used smokeless tobacco. She reports current  alcohol use. She reports that she does not use drugs.  ROS: UROLOGY Frequent Urination?: Yes Hard to postpone urination?: No Burning/pain with urination?: No Get up at night to urinate?: Yes Leakage of urine?: No Urine stream starts and stops?: No Trouble starting stream?: No Do you have to strain to urinate?: No Blood in urine?: No Urinary tract infection?: No Sexually transmitted disease?: No Injury to kidneys or bladder?: No Painful intercourse?: No Weak stream?: No Currently pregnant?: No Vaginal bleeding?: No Last menstrual period?: Hysterectomy  Gastrointestinal Nausea?: No Vomiting?: No Indigestion/heartburn?:  No Diarrhea?: No Constipation?: No  Constitutional Fever: No Night sweats?: No Weight loss?: No Fatigue?: No  Skin Skin rash/lesions?: No Itching?: No  Eyes Blurred vision?: No Double vision?: No  Ears/Nose/Throat Sore throat?: No Sinus problems?: No  Hematologic/Lymphatic Swollen glands?: No Easy bruising?: No  Cardiovascular Leg swelling?: No Chest pain?: No  Respiratory Cough?: No Shortness of breath?: No  Endocrine Excessive thirst?: No  Musculoskeletal Back pain?: No Joint pain?: No  Neurological Headaches?: No Dizziness?: No  Psychologic Depression?: No Anxiety?: No  Physical Exam: BP (!) 163/65 (BP Location: Left Arm, Patient Position: Sitting, Cuff Size: Normal)   Pulse 64   Ht 4' 9.5" (1.461 m)   Wt 135 lb 14.4 oz (61.6 kg)   BMI 28.90 kg/m   Constitutional:  Alert and oriented, No acute distress. Respiratory: Normal respiratory effort, no increased work of breathing. Head: Normocephalic and Atraumatic. GU: No CVA tenderness Skin: No rashes, bruises or suspicious lesions. Neurologic: Grossly intact, no focal deficits, moving all 4 extremities. Psychiatric: Normal mood and affect.  Laboratory Data: Lab Results  Component Value Date   WBC 5.6 11/24/2018   HGB 14.1 11/24/2018   HCT 41.7 11/24/2018   MCV 93 11/24/2018   PLT 265 11/24/2018   Lab Results  Component Value Date   CREATININE 0.77 11/24/2018   Lab Results  Component Value Date   HGBA1C 5.2 02/26/2017   Pertinent Imaging:   Imaging retrieved via [place] from [facility], Rad Results In - 02/13/2018  9:54 AM EST EXAM: [place] RETROPERITONEAL COMPLETE (AO/IVC) DATE: 02/13/2018 9:39 AM ACCESSION: [id number] UN DICTATED: 02/13/2018 9:39 AM INTERPRETATION LOCATION: [place]  CLINICAL INDICATION: 73 years old Female with - Renal massN28.1-Acquired cyst of kidney    COMPARISON: 09/04/2016  TECHNIQUE: Static and cine images of the kidneys and bladder  were performed.  FINDINGS:  KIDNEYS: Normal size and echogenicity. Again noted is an ill-defined mass in the upper pole of the right kidney measuring 1.6 x 1.8 x 1.0 cm, similar compared with 09/04/2016 (previously 1.6 x 1.3 x 1.3 cm). No new/enlarging masses identified. No calculi. No hydronephrosis.  BLADDER: Unremarkable.  IMPRESSION: Right renal mass, similar in size and appearance compared with 09/04/2016.    Please see below for data measurements:   Right kidney: 11.57 cm Left kidney:  10.17 cm Bladder volume: 312.3  mL  Imaging retrieved via Scan from [facility] Exam: US ABDOMINAL COMPLETE   CLINICAL HISTORY: Pain  Technique: Grayscale and color Doppler evaluation was performed of the abdomen. Exam is limited secondary to overlying bowel gas.  FINDINGS: Liver: Increased echogenicity. NO mass, cysts, or ductal dilation. Measures 11.7 cm.  Pancreas: Visualized portions are normal with no mass, cyst, or ductal dilation.  Gallbladder: Gallbladder is contracted. No stones or sludge. Normal wall thickness. No pericholecystic fluid. CBD is 0.41 cn.  Right kidney: Limited views of the right kidney but no gross abnormality such as cysts, masses,  stones, or hydronephrosis are appreciated.   Left kidney: Nonvisualized.  Spleen: Homogeneous and normal is size.  Additional: Visualized portion of the aorta are unremarkable  IMPRESSION: Limited exam secondary to overlying bowel gas. Hepatic steatosis is noted. Gallbladder is contracted. No acute abnormality is seen. Left kidney was unable to be visualized.  Electronically signed: Su Hilt MD Signed on 12/12/2018 11:04  Assessment & Plan:   1. Right Renal Mass  - Ordered RUS, will call patient with results  - Plan to continue monitoring with RUS annually  Return in about 1 year (around 02/17/2020) for RUS.   Abbie Sons, MD [facility] [address] [place], [place] [address] [phone]  I, [phone] Renne Crigler , am acting as a scribe for Abbie Sons, MD  I, Abbie Sons, MD, have reviewed all documentation for this visit. The documentation on 02/16/19 for the exam, diagnosis, procedures, and orders are all accurate and complete.

## 2019-02-16 ENCOUNTER — Encounter: Payer: Self-pay | Admitting: Urology

## 2019-02-16 ENCOUNTER — Ambulatory Visit (INDEPENDENT_AMBULATORY_CARE_PROVIDER_SITE_OTHER): Payer: Medicare HMO | Admitting: Urology

## 2019-02-16 VITALS — BP 163/65 | HR 64 | Ht <= 58 in | Wt 135.9 lb

## 2019-02-16 DIAGNOSIS — N2889 Other specified disorders of kidney and ureter: Secondary | ICD-10-CM

## 2019-02-23 ENCOUNTER — Ambulatory Visit
Admission: RE | Admit: 2019-02-23 | Discharge: 2019-02-23 | Disposition: A | Discharge status: Home / Self Care | Payer: Medicare HMO | Source: Ambulatory Visit | Attending: Urology | Admitting: Urology

## 2019-02-23 DIAGNOSIS — N2889 Other specified disorders of kidney and ureter: Secondary | ICD-10-CM | POA: Diagnosis not present

## 2019-03-02 ENCOUNTER — Telehealth: Payer: Self-pay

## 2019-03-02 NOTE — Telephone Encounter (Signed)
-----   Message from Abbie Sons, MD sent at 03/01/2019 10:29 AM EDT ----- Renal ultrasound showed a stable right renal mass without size change.  Continue annual follow-up

## 2019-03-02 NOTE — Telephone Encounter (Signed)
Patient notified and will keep 1year follow up

## 2019-03-09 ENCOUNTER — Ambulatory Visit: Payer: Medicare HMO | Admitting: Podiatry

## 2019-03-09 ENCOUNTER — Encounter: Payer: Self-pay | Admitting: Podiatry

## 2019-03-09 ENCOUNTER — Other Ambulatory Visit: Payer: Self-pay

## 2019-03-09 DIAGNOSIS — I781 Nevus, non-neoplastic: Secondary | ICD-10-CM | POA: Diagnosis not present

## 2019-03-09 DIAGNOSIS — Z1283 Encounter for screening for malignant neoplasm of skin: Secondary | ICD-10-CM | POA: Diagnosis not present

## 2019-03-09 DIAGNOSIS — Q828 Other specified congenital malformations of skin: Secondary | ICD-10-CM

## 2019-03-09 DIAGNOSIS — L820 Inflamed seborrheic keratosis: Secondary | ICD-10-CM | POA: Diagnosis not present

## 2019-03-09 DIAGNOSIS — D225 Melanocytic nevi of trunk: Secondary | ICD-10-CM | POA: Diagnosis not present

## 2019-03-09 DIAGNOSIS — I8393 Asymptomatic varicose veins of bilateral lower extremities: Secondary | ICD-10-CM | POA: Diagnosis not present

## 2019-03-09 DIAGNOSIS — L249 Irritant contact dermatitis, unspecified cause: Secondary | ICD-10-CM | POA: Diagnosis not present

## 2019-03-09 DIAGNOSIS — D2239 Melanocytic nevi of other parts of face: Secondary | ICD-10-CM | POA: Diagnosis not present

## 2019-03-09 DIAGNOSIS — M216X1 Other acquired deformities of right foot: Secondary | ICD-10-CM

## 2019-03-09 DIAGNOSIS — B078 Other viral warts: Secondary | ICD-10-CM | POA: Diagnosis not present

## 2019-03-09 DIAGNOSIS — D229 Melanocytic nevi, unspecified: Secondary | ICD-10-CM | POA: Diagnosis not present

## 2019-03-09 DIAGNOSIS — L578 Other skin changes due to chronic exposure to nonionizing radiation: Secondary | ICD-10-CM | POA: Diagnosis not present

## 2019-03-09 NOTE — Progress Notes (Signed)
This patient presents the office with chief complaint of a painful callus on the ball of her right foot.  She says this callus has been developing for months and is aggravated since she stands long periods of time at work.  She denies any history of trauma or injury to the foot.  She says that she has been changing her shoes the last week but the pain persists.  She says she is also been wearing a circular pad to take pressure off the spot when she works.  She presents the office today for an evaluation and treatment of this painful skin lesion.  Vascular  Dorsalis pedis and posterior tibial pulses are palpable  B/L.  Capillary return  WNL.  Temperature gradient is  WNL.  Skin turgor  WNL  Sensorium  Senn Weinstein monofilament wire  WNL. Normal tactile sensation.  Nail Exam  Patient has normal nails with no evidence of bacterial or fungal infection.  Orthopedic  Exam  Muscle tone and muscle strength  WNL.  No limitations of motion feet  B/L.  No crepitus or joint effusion noted.  Foot type is unremarkable and digits show no abnormalities.  Bony prominences are unremarkable. Plantar flex 4th metatarsal right foot.  Skin  No open lesions.  Normal skin texture and turgor. Porokeratosis sub 4 right foot.  ROV. Debride porokeratosis.  Padding was dispensed.  Discussed this condition with this patient and told her that it develops due to her fat pad moving under her toes exposing the metatarsal bones  .  RTC prn.  Discussed obtaining insoles for this patient.   Gardiner Barefoot DPM

## 2019-03-12 ENCOUNTER — Other Ambulatory Visit: Payer: Self-pay

## 2019-03-12 DIAGNOSIS — K219 Gastro-esophageal reflux disease without esophagitis: Secondary | ICD-10-CM

## 2019-03-12 MED ORDER — OMEPRAZOLE 20 MG PO CPDR: 20.0000 mg | DELAYED_RELEASE_CAPSULE | Freq: Two times a day (BID) | ORAL | 3 refills | Status: DC

## 2019-03-16 ENCOUNTER — Other Ambulatory Visit: Payer: Self-pay

## 2019-03-16 DIAGNOSIS — I10 Essential (primary) hypertension: Secondary | ICD-10-CM

## 2019-03-16 MED ORDER — AMLODIPINE BESYLATE 10 MG PO TABS: 10.0000 mg | ORAL_TABLET | Freq: Every day | ORAL | 5 refills | Status: DC

## 2019-04-27 ENCOUNTER — Ambulatory Visit: Payer: Self-pay | Admitting: Nurse Practitioner

## 2019-05-13 ENCOUNTER — Other Ambulatory Visit: Payer: Self-pay

## 2019-05-13 ENCOUNTER — Ambulatory Visit (INDEPENDENT_AMBULATORY_CARE_PROVIDER_SITE_OTHER): Payer: Medicare HMO | Admitting: Nurse Practitioner

## 2019-05-13 ENCOUNTER — Encounter: Payer: Self-pay | Admitting: Nurse Practitioner

## 2019-05-13 VITALS — BP 158/73 | HR 69 | Temp 96.8°F | Resp 16 | Ht <= 58 in | Wt 135.0 lb

## 2019-05-13 DIAGNOSIS — I10 Essential (primary) hypertension: Secondary | ICD-10-CM

## 2019-05-13 DIAGNOSIS — G2581 Restless legs syndrome: Secondary | ICD-10-CM

## 2019-05-13 NOTE — Progress Notes (Signed)
[facility] [place], [place] [address]  Internal MEDICINE  Telephone Visit  Patient Name: Cynthia Keith  [id number]  [id number]  Date of Service: 05/13/2019  I connected with the patient at 10:38am by webcam and verified the patients identity using two identifiers.   I discussed the limitations, risks, security and privacy concerns of performing an evaluation and management service by webcam and the availability of in person appointments. I also discussed with the patient that there may be a patient responsible charge related to the service.  The patient expressed understanding and agrees to proceed.    Chief Complaint  Patient presents with  . Leg Pain    right leg/started at the calf and radiating to her hip/only bothers her when she sleeps  . Telephone Assessment  . Telephone Screen    The patient has been contacted via webcam for follow up visit due to concerns for spread of novel coronavirus. The patient is c/o pain in right leg. Pain goes from her shin to her hip. When she is sleeping, laying on the right side, this pain gets much worse. States that pain will get worse and keeps her awake at night. She states that there is no pain at all during the day. There is no swelling, redness, or warmth associated with this.  She states that she has not injured the leg at all. Has been trying to do some stretches of the leg which has helped a little. Difficult time to remember to do them during the day.       Current Medication: Outpatient Encounter Medications as of 05/13/2019  Medication Sig  . amLODipine (NORVASC) 10 MG tablet Take 1 tablet (10 mg total) by mouth daily.  Marland Kitchen aspirin EC 81 MG tablet Take 81 mg by mouth daily.  Marland Kitchen ibuprofen (ADVIL,MOTRIN) 200 MG tablet Take 1 tablet (200 mg total) by mouth every 6 (six) hours as needed for moderate pain.  Marland Kitchen ibuprofen (ADVIL,MOTRIN) 600 MG tablet Take by mouth.  . Multiple Vitamin (MULTIVITAMIN) tablet Take 1  tablet by mouth daily.  Marland Kitchen nystatin cream (MYCOSTATIN) Apply 1 application topically 2 (two) times daily.  Marland Kitchen omeprazole (PRILOSEC) 20 MG capsule Take 1 capsule (20 mg total) by mouth 2 (two) times daily before a meal.   No facility-administered encounter medications on file as of 05/13/2019.     Surgical History: Past Surgical History:  Procedure Laterality Date  . ABDOMINAL HYSTERECTOMY    . COLONOSCOPY WITH PROPOFOL N/A 12/12/2015   Procedure: COLONOSCOPY WITH PROPOFOL;  Surgeon: Manya Silvas, MD;  Location: [facility] ENDOSCOPY;  Service: Endoscopy;  Laterality: N/A;  . HAND SURGERY  2005    Medical History: Past Medical History:  Diagnosis Date  . GERD (gastroesophageal reflux disease)   . Hyperlipidemia   . Hypertension   . Plantar fasciitis   . Sleep apnea     Family History: Family History  Problem Relation Age of Onset  . Stroke Mother   . Heart disease Paternal Grandmother   . Cancer Paternal Grandfather     Social History   Socioeconomic History  . Marital status: Married    Spouse name: Not on file  . Number of children: Not on file  . Years of education: Not on file  . Highest education level: Not on file  Occupational History  . Not on file  Social Needs  . Financial resource strain: Not on file  . Food insecurity:    Worry: Not on  file    Inability: Not on file  . Transportation needs:    Medical: Not on file    Non-medical: Not on file  Tobacco Use  . Smoking status: Never Smoker  . Smokeless tobacco: Never Used  Substance and Sexual Activity  . Alcohol use: Yes    Comment: ocassionally  . Drug use: No  . Sexual activity: Yes    Birth control/protection: Post-menopausal, Surgical  Lifestyle  . Physical activity:    Days per week: Not on file    Minutes per session: Not on file  . Stress: Not on file  Relationships  . Social connections:    Talks on phone: Not on file    Gets together: Not on file    Attends religious service: Not on  file    Active member of club or organization: Not on file    Attends meetings of clubs or organizations: Not on file    Relationship status: Not on file  . Intimate partner violence:    Fear of current or ex partner: Not on file    Emotionally abused: Not on file    Physically abused: Not on file    Forced sexual activity: Not on file  Other Topics Concern  . Not on file  Social History Narrative  . Not on file      Review of Systems  Constitutional: Negative for chills, fatigue and unexpected weight change.  HENT: Negative for congestion, postnasal drip, rhinorrhea, sneezing and sore throat.   Respiratory: Negative for cough, chest tightness and shortness of breath.   Cardiovascular: Negative for chest pain and palpitations.  Gastrointestinal: Negative for abdominal pain, constipation, diarrhea, nausea and vomiting.  Musculoskeletal: Positive for arthralgias and myalgias. Negative for back pain, gait problem, joint swelling and neck pain.       Right lower leg pain, especially over the shin area. Happening only at night. Is she lays on her right side, pain will go up to her right hip and keep her awake.   Skin: Negative for rash.  Neurological: Negative for dizziness, tremors, facial asymmetry and numbness.  Hematological: Negative for adenopathy. Does not bruise/bleed easily.  Psychiatric/Behavioral: Negative for behavioral problems (Depression), sleep disturbance and suicidal ideas. The patient is not nervous/anxious.     Today's Vitals   05/13/19 1020  BP: (!) 158/73  Pulse: 69  Resp: 16  Temp: (!) 96.8 F (36 C)  Weight: 135 lb (61.2 kg)  Height: 4\' 9"  (1.448 m)   Body mass index is 29.21 kg/m.  Observation/Objective:   The patient is alert and oriented. She is pleasant and answers all questions appropriately. Breathing is non-labored. She is in no acute distress at this time.  She has shown me her right lower leg with her camera. There is no redness or swelling  present. She is able to palpate the entire right lower leg without pain right now. She is walking around her home without difficulty or pain.    Assessment/Plan:  1. Restless legs syndrome Based on symptoms, believe she has RLS. Offered medication to help with this at night. She states she would rather try essential oils meant to help with pain and restless legs. Will discuss more at visit 02/22/2019.   2. Essential hypertension Continue BP medication as prescribed .  General Counseling: quanda pavlicek understanding of the findings of today's phone visit and agrees with plan of treatment. I have discussed any further diagnostic evaluation that may be needed or ordered today. We  also reviewed her medications today. she has been encouraged to call the office with any questions or concerns that should arise related to todays visit.    Time spent: 43 Minutes    Dr Lavera Guise Internal medicine

## 2019-05-25 ENCOUNTER — Ambulatory Visit: Payer: Self-pay | Admitting: Nurse Practitioner

## 2019-06-30 ENCOUNTER — Other Ambulatory Visit: Payer: Self-pay | Admitting: Nurse Practitioner

## 2019-06-30 DIAGNOSIS — K219 Gastro-esophageal reflux disease without esophagitis: Secondary | ICD-10-CM

## 2019-06-30 MED ORDER — OMEPRAZOLE 20 MG PO CPDR: 20.0000 mg | DELAYED_RELEASE_CAPSULE | Freq: Two times a day (BID) | ORAL | 3 refills | Status: DC

## 2019-07-06 ENCOUNTER — Encounter: Payer: Self-pay | Admitting: Nurse Practitioner

## 2019-07-06 ENCOUNTER — Other Ambulatory Visit: Payer: Self-pay

## 2019-07-06 ENCOUNTER — Ambulatory Visit (INDEPENDENT_AMBULATORY_CARE_PROVIDER_SITE_OTHER): Payer: Medicare HMO | Admitting: Nurse Practitioner

## 2019-07-06 VITALS — BP 167/82 | HR 71 | Temp 97.1°F | Resp 16 | Ht <= 58 in | Wt 137.2 lb

## 2019-07-06 DIAGNOSIS — Z0001 Encounter for general adult medical examination with abnormal findings: Secondary | ICD-10-CM | POA: Diagnosis not present

## 2019-07-06 DIAGNOSIS — K219 Gastro-esophageal reflux disease without esophagitis: Secondary | ICD-10-CM

## 2019-07-06 DIAGNOSIS — Z13820 Encounter for screening for osteoporosis: Secondary | ICD-10-CM

## 2019-07-06 DIAGNOSIS — I10 Essential (primary) hypertension: Secondary | ICD-10-CM

## 2019-07-06 DIAGNOSIS — Z1239 Encounter for other screening for malignant neoplasm of breast: Secondary | ICD-10-CM

## 2019-07-06 DIAGNOSIS — R300 Dysuria: Secondary | ICD-10-CM

## 2019-07-06 MED ORDER — OMEPRAZOLE 20 MG PO CPDR: 20.0000 mg | DELAYED_RELEASE_CAPSULE | Freq: Two times a day (BID) | ORAL | 3 refills | Status: DC

## 2019-07-06 NOTE — Progress Notes (Signed)
[facility] [place], [place] [address]  Internal MEDICINE  Office Visit Note  Patient Name: Cynthia Keith  [id number]  [id number]  Date of Service: 07/06/2019   Pt is here for routine health maintenance examination  Chief Complaint  Patient presents with  . Medicare Wellness  . Gastroesophageal Reflux  . Hypertension  . Hyperlipidemia  . Rash    rash on leg and hand, not sure if its infected, been going on for about a month, pt uses dermatology cream but not seeming to help.      The patient is here for health maintenance exam. Today, she is complaining of worsening acid reflux. Had weaned down prilosec to once daily. States that she had to buy some OTC medication to help on as needed when reflux got severe. She also has very itchy rash on inner part of the right leg and on the right ring finger. Rash is red and irritated. No evidence of infection present. She uses clobetasol cream, prescribed by her dermatologist. This does help some. She is due to have screening mammogram as well as bone density test.     Current Medication: Outpatient Encounter Medications as of 07/06/2019  Medication Sig  . amLODipine (NORVASC) 10 MG tablet Take 1 tablet (10 mg total) by mouth daily.  Marland Kitchen aspirin EC 81 MG tablet Take 81 mg by mouth daily.  . clobetasol (TEMOVATE) 0.05 % external solution PLEASE SEE ATTACHED FOR DETAILED DIRECTIONS  . ibuprofen (ADVIL,MOTRIN) 200 MG tablet Take 1 tablet (200 mg total) by mouth every 6 (six) hours as needed for moderate pain.  Marland Kitchen ibuprofen (ADVIL,MOTRIN) 600 MG tablet Take by mouth.  . Multiple Vitamin (MULTIVITAMIN) tablet Take 1 tablet by mouth daily.  Marland Kitchen nystatin cream (MYCOSTATIN) Apply 1 application topically 2 (two) times daily.  Marland Kitchen omeprazole (PRILOSEC) 20 MG capsule Take 1 capsule (20 mg total) by mouth 2 (two) times daily before a meal.  . [DISCONTINUED] Multiple Vitamin (MULTI-VITAMIN) tablet Take 1 tablet by mouth.  .  [DISCONTINUED] omeprazole (PRILOSEC) 20 MG capsule Take 1 capsule (20 mg total) by mouth 2 (two) times daily before a meal.   No facility-administered encounter medications on file as of 07/06/2019.     Surgical History: Past Surgical History:  Procedure Laterality Date  . ABDOMINAL HYSTERECTOMY    . COLONOSCOPY WITH PROPOFOL N/A 12/12/2015   Procedure: COLONOSCOPY WITH PROPOFOL;  Surgeon: Manya Silvas, MD;  Location: [facility] ENDOSCOPY;  Service: Endoscopy;  Laterality: N/A;  . HAND SURGERY  2005    Medical History: Past Medical History:  Diagnosis Date  . GERD (gastroesophageal reflux disease)   . Hyperlipidemia   . Hypertension   . Plantar fasciitis   . Sleep apnea     Family History: Family History  Problem Relation Age of Onset  . Stroke Mother   . Heart disease Paternal Grandmother   . Cancer Paternal Grandfather       Review of Systems  Constitutional: Negative for activity change, chills, fatigue and unexpected weight change.  HENT: Negative for congestion, postnasal drip, rhinorrhea, sneezing and sore throat.   Respiratory: Negative for cough, chest tightness and shortness of breath.   Cardiovascular: Negative for chest pain and palpitations.       Blood pressure elevated today.   Gastrointestinal: Negative for abdominal pain, constipation, diarrhea, nausea and vomiting.       Worsening acid reflux.   Endocrine: Negative for cold intolerance, heat intolerance, polydipsia and polyuria.  Genitourinary:  Negative for flank pain, frequency, hematuria and urgency.  Musculoskeletal: Positive for arthralgias and myalgias. Negative for back pain, gait problem, joint swelling and neck pain.       Lower back pain which radiates to bilateral hips and lower legs. This is intermittent and gets worse with exertion and sitting for long periods of time .  Skin: Positive for rash.       Red, inflamed rash on inner aspect of the right lower leg and right ring finger.   Allergic/Immunologic: Negative for environmental allergies.  Neurological: Negative for dizziness, tremors, facial asymmetry and numbness.  Hematological: Negative for adenopathy. Does not bruise/bleed easily.  Psychiatric/Behavioral: Negative for behavioral problems (Depression), sleep disturbance and suicidal ideas. The patient is not nervous/anxious.     Today's Vitals   07/06/19 0953  BP: (!) 167/82  Pulse: 71  Resp: 16  Temp: (!) 97.1 F (36.2 C)  SpO2: 99%  Weight: 137 lb 3.2 oz (62.2 kg)  Height: 4\' 9"  (1.448 m)   Body mass index is 29.69 kg/m.  Physical Exam Vitals signs and nursing note reviewed.  Constitutional:      General: She is not in acute distress.    Appearance: Normal appearance. She is well-developed. She is not diaphoretic.  HENT:     Head: Normocephalic and atraumatic.     Mouth/Throat:     Pharynx: No oropharyngeal exudate.  Eyes:     Extraocular Movements: Extraocular movements intact.     Conjunctiva/sclera: Conjunctivae normal.     Pupils: Pupils are equal, round, and reactive to light.  Neck:     Musculoskeletal: Normal range of motion and neck supple.     Thyroid: No thyromegaly.     Vascular: No carotid bruit or JVD.     Trachea: No tracheal deviation.  Cardiovascular:     Rate and Rhythm: Normal rate and regular rhythm.     Pulses: Normal pulses.     Heart sounds: Normal heart sounds. No murmur. No friction rub. No gallop.   Pulmonary:     Effort: Pulmonary effort is normal. No respiratory distress.     Breath sounds: No wheezing or rales.  Chest:     Chest wall: No tenderness.     Breasts:        Right: Normal. No swelling, bleeding, inverted nipple, mass, nipple discharge, skin change or tenderness.        Left: Normal. No swelling, bleeding, inverted nipple, mass, nipple discharge, skin change or tenderness.  Abdominal:     General: Bowel sounds are normal.     Palpations: Abdomen is soft.     Tenderness: There is no abdominal  tenderness.  Musculoskeletal: Normal range of motion.  Lymphadenopathy:     Cervical: No cervical adenopathy.  Skin:    General: Skin is warm and dry.     Capillary Refill: Capillary refill takes less than 2 seconds.  Neurological:     General: No focal deficit present.     Mental Status: She is alert and oriented to person, place, and time.     Cranial Nerves: No cranial nerve deficit.  Psychiatric:        Behavior: Behavior normal.        Thought Content: Thought content normal.        Judgment: Judgment normal.    Depression screen [facility] 2/9 07/06/2019 05/13/2019 12/29/2018 11/17/2018 01/27/2018  Decreased Interest 0 0 0 0 0  Down, Depressed, Hopeless 0 0 0 0 0  PHQ -  2 Score 0 0 0 0 0    Functional Status Survey: Is the patient deaf or have difficulty hearing?: No Does the patient have difficulty seeing, even when wearing glasses/contacts?: No Does the patient have difficulty concentrating, remembering, or making decisions?: No Does the patient have difficulty walking or climbing stairs?: No Does the patient have difficulty dressing or bathing?: No Does the patient have difficulty doing errands alone such as visiting a doctor's office or shopping?: No  MMSE - [place] Exam 07/06/2019 11/19/2018  Orientation to time 5 5  Orientation to Place 5 5  Registration 3 3  Attention/ Calculation 5 5  Recall 3 3  Language- name 2 objects 2 2  Language- repeat 1 1  Language- follow 3 step command 3 3  Language- read & follow direction 1 1  Write a sentence 1 1  Copy design 1 1  Total score 30 30    Fall Risk  07/06/2019 05/13/2019 12/29/2018 11/17/2018 01/27/2018  Falls in the past year? 0 0 0 0 No     Assessment/Plan:  1. Encounter for general adult medical examination with abnormal findings Annual health maintenance exam today  2. Essential hypertension Blood pressure moderately elevated. Advised increasing amlodipine. Patient declined increased medication. Will monitor  blood pressure everyday for the next week. Will all office and advise me of readings. Will adjust as indicated based on these readings. Advised her to increase water intake and reduce salt.   3. Gastroesophageal reflux disease without esophagitis Increase prilosec back to twice daily dosing. Avoid triggers. Imaging studies to be ordered as indicated.  - omeprazole (PRILOSEC) 20 MG capsule; Take 1 capsule (20 mg total) by mouth 2 (two) times daily before a meal.  Dispense: 180 capsule; Refill: 3  4. Screening for breast cancer - MM DIGITAL SCREENING BILATERAL; Future  5. Screening for osteoporosis - DG Bone Density; Future  6. Dysuria - UA/M w/rflx Culture, Routine  General Counseling: dorette hartel understanding of the findings of todays visit and agrees with plan of treatment. I have discussed any further diagnostic evaluation that may be needed or ordered today. We also reviewed her medications today. she has been encouraged to call the office with any questions or concerns that should arise related to todays visit.    Counseling:  Hypertension Counseling:   The following hypertensive lifestyle modification were recommended and discussed:  1. Limiting alcohol intake to less than 1 oz/day of ethanol:(24 oz of beer or 8 oz of wine or 2 oz of 100-proof whiskey). 2. Take baby ASA 81 mg daily. 3. Importance of regular aerobic exercise and losing weight. 4. Reduce dietary saturated fat and cholesterol intake for overall cardiovascular health. 5. Maintaining adequate dietary potassium, calcium, and magnesium intake. 6. Regular monitoring of the blood pressure. 7. Reduce sodium intake to less than 100 mmol/day (less than 2.3 gm of sodium or less than 6 gm of sodium choride)   This patient was seen by [place] with Dr Lavera Guise as a part of collaborative care agreement  Orders Placed This Encounter  Procedures  . DG Bone Density  . MM DIGITAL SCREENING  BILATERAL  . UA/M w/rflx Culture, Routine    Meds ordered this encounter  Medications  . omeprazole (PRILOSEC) 20 MG capsule    Sig: Take 1 capsule (20 mg total) by mouth 2 (two) times daily before a meal.    Dispense:  180 capsule    Refill:  3  Increased to twice daily.    Order Specific Question:   Supervising Provider    Answer:   Lavera Guise [[id number]]    Time spent: [place], MD  Internal Medicine

## 2019-07-07 ENCOUNTER — Other Ambulatory Visit: Payer: Self-pay

## 2019-07-07 ENCOUNTER — Telehealth: Payer: Self-pay

## 2019-07-07 MED ORDER — CIPROFLOXACIN HCL 500 MG PO TABS: 500.0000 mg | ORAL_TABLET | Freq: Two times a day (BID) | ORAL | 0 refills | Status: AC

## 2019-07-07 NOTE — Telephone Encounter (Signed)
-----   Message from Lavera Guise, MD sent at 07/07/2019  9:44 AM EDT ----- Mild UTI.. Call in cipro 500 mg po bid x 5 days # 10 ----- Message ----- From: Interface, [facility] Lab Results In Sent: 07/07/2019   7:36 AM EDT To: Ronnell Freshwater, NP

## 2019-07-07 NOTE — Telephone Encounter (Signed)
Pt advised urine did showed infection we send cipro to her phar and also she recheck her bp today 144/76 advised her bp check and record and also low sodium diet and relayed message to [employer]

## 2019-07-08 LAB — UA/M W/RFLX CULTURE, ROUTINE
Bilirubin, UA: NEGATIVE
Glucose, UA: NEGATIVE
Ketones, UA: NEGATIVE
Nitrite, UA: NEGATIVE
Protein,UA: NEGATIVE
RBC, UA: NEGATIVE
Specific Gravity, UA: 1.01 (ref 1.005–1.030)
Urobilinogen, Ur: 0.2 mg/dL (ref 0.2–1.0)
pH, UA: 5.5 (ref 5.0–7.5)

## 2019-07-08 LAB — URINE CULTURE, REFLEX

## 2019-07-08 LAB — MICROSCOPIC EXAMINATION: Casts: NONE SEEN /lpf

## 2019-07-13 ENCOUNTER — Telehealth: Payer: Self-pay

## 2019-07-13 NOTE — Telephone Encounter (Signed)
Pt advised keep track on bp reading no changes for now if high readings call us back and also keep check BP and let us know

## 2019-07-13 NOTE — Telephone Encounter (Signed)
Mostly these blood pressure readings are good. I don't want to change anything, especially based on the last few readings. Please advise he to continue to monitor and let us know if there are any changes, especially high readings. Thanks.

## 2019-08-05 ENCOUNTER — Ambulatory Visit (INDEPENDENT_AMBULATORY_CARE_PROVIDER_SITE_OTHER): Payer: Medicare HMO | Admitting: Adult Health

## 2019-08-05 ENCOUNTER — Other Ambulatory Visit: Payer: Self-pay

## 2019-08-05 VITALS — BP 155/78 | HR 75 | Resp 16 | Ht <= 58 in | Wt 142.4 lb

## 2019-08-05 DIAGNOSIS — K219 Gastro-esophageal reflux disease without esophagitis: Secondary | ICD-10-CM | POA: Diagnosis not present

## 2019-08-05 DIAGNOSIS — I10 Essential (primary) hypertension: Secondary | ICD-10-CM | POA: Diagnosis not present

## 2019-08-05 DIAGNOSIS — R300 Dysuria: Secondary | ICD-10-CM | POA: Diagnosis not present

## 2019-08-05 LAB — POCT URINALYSIS DIPSTICK
Bilirubin, UA: NEGATIVE
Blood, UA: NEGATIVE
Glucose, UA: NEGATIVE
Ketones, UA: NEGATIVE
Leukocytes, UA: NEGATIVE
Nitrite, UA: NEGATIVE
Protein, UA: NEGATIVE
Spec Grav, UA: 1.01 (ref 1.010–1.025)
Urobilinogen, UA: 0.2 E.U./dL
pH, UA: 5 (ref 5.0–8.0)

## 2019-08-05 NOTE — Progress Notes (Signed)
[facility] [place], [place] [address]  Internal MEDICINE  Office Visit Note  Patient Name: Cynthia Keith  [id number]  [id number]  Date of Service: 08/05/2019  Chief Complaint  Patient presents with  . Urinary Tract Infection    recheck urine  . Pain    leg and stomach/groin pain     HPI Pt is here for a sick visit. She would like to have her urine rechecked. She has been having some groin and stomach pain and she is concerned it may be her urine again. Her urine is clean today, and she reports the pain has improved some.  She has also been making very poor choices in regards to her diet.       Current Medication:  Outpatient Encounter Medications as of 08/05/2019  Medication Sig  . amLODipine (NORVASC) 10 MG tablet Take 1 tablet (10 mg total) by mouth daily.  Marland Kitchen aspirin EC 81 MG tablet Take 81 mg by mouth daily.  . clobetasol (TEMOVATE) 0.05 % external solution PLEASE SEE ATTACHED FOR DETAILED DIRECTIONS  . Multiple Vitamin (MULTIVITAMIN) tablet Take 1 tablet by mouth daily.  Marland Kitchen omeprazole (PRILOSEC) 20 MG capsule Take 1 capsule (20 mg total) by mouth 2 (two) times daily before a meal.  . [DISCONTINUED] ibuprofen (ADVIL,MOTRIN) 200 MG tablet Take 1 tablet (200 mg total) by mouth every 6 (six) hours as needed for moderate pain. (Patient not taking: Reported on 08/05/2019)  . [DISCONTINUED] ibuprofen (ADVIL,MOTRIN) 600 MG tablet Take by mouth.  . [DISCONTINUED] nystatin cream (MYCOSTATIN) Apply 1 application topically 2 (two) times daily.   No facility-administered encounter medications on file as of 08/05/2019.       Medical History: Past Medical History:  Diagnosis Date  . GERD (gastroesophageal reflux disease)   . Hyperlipidemia   . Hypertension   . Plantar fasciitis   . Sleep apnea      Vital Signs: BP (!) 155/78   Pulse 75   Resp 16   Ht 4\' 9"  (1.448 m)   Wt 142 lb 6.4 oz (64.6 kg)   SpO2 96%   BMI 30.82 kg/m    Review of  Systems  Constitutional: Negative for chills, fatigue and unexpected weight change.  HENT: Negative for congestion, rhinorrhea, sneezing and sore throat.   Eyes: Negative for photophobia, pain and redness.  Respiratory: Negative for cough, chest tightness and shortness of breath.   Cardiovascular: Negative for chest pain and palpitations.  Gastrointestinal: Negative for abdominal pain, constipation, diarrhea, nausea and vomiting.  Endocrine: Negative.   Genitourinary: Negative for dysuria and frequency.  Musculoskeletal: Negative for arthralgias, back pain, joint swelling and neck pain.  Skin: Negative for rash.  Allergic/Immunologic: Negative.   Neurological: Negative for tremors and numbness.  Hematological: Negative for adenopathy. Does not bruise/bleed easily.  Psychiatric/Behavioral: Negative for behavioral problems and sleep disturbance. The patient is not nervous/anxious.     Physical Exam Vitals signs and nursing note reviewed.  Constitutional:      General: She is not in acute distress.    Appearance: She is well-developed. She is not diaphoretic.  HENT:     Head: Normocephalic and atraumatic.     Mouth/Throat:     Pharynx: No oropharyngeal exudate.  Eyes:     Pupils: Pupils are equal, round, and reactive to light.  Neck:     Musculoskeletal: Normal range of motion and neck supple.     Thyroid: No thyromegaly.     Vascular: No JVD.  Trachea: No tracheal deviation.  Cardiovascular:     Rate and Rhythm: Normal rate and regular rhythm.     Heart sounds: Normal heart sounds. No murmur. No friction rub. No gallop.   Pulmonary:     Effort: Pulmonary effort is normal. No respiratory distress.     Breath sounds: Normal breath sounds. No wheezing or rales.  Chest:     Chest wall: No tenderness.  Abdominal:     Palpations: Abdomen is soft.     Tenderness: There is no abdominal tenderness. There is no guarding.  Musculoskeletal: Normal range of motion.  Lymphadenopathy:      Cervical: No cervical adenopathy.  Skin:    General: Skin is warm and dry.  Neurological:     Mental Status: She is alert and oriented to person, place, and time.     Cranial Nerves: No cranial nerve deficit.  Psychiatric:        Behavior: Behavior normal.        Thought Content: Thought content normal.        Judgment: Judgment normal.    Assessment/Plan: 1. Dysuria Improved. Pts urine is clear.  - POCT Urinalysis Dipstick  2. Essential hypertension Pt had bp log and is mostly controlled.  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   3. Gastroesophageal reflux disease without esophagitis Stable, continue present management.   General Counseling: eleina jergens understanding of the findings of todays visit and agrees with plan of treatment. I have discussed any further diagnostic evaluation that may be needed or ordered today. We also reviewed her medications today. she has been encouraged to call the office with any questions or concerns that should arise related to todays visit.   Orders Placed This Encounter  Procedures  . POCT Urinalysis Dipstick    No orders of the defined types were placed in this encounter.   Time spent: 15 Minutes  This patient was seen by Orson Gear AGNP-C in Collaboration with Dr Lavera Guise as a part of collaborative care agreement.  Kendell Bane AGNP-C Internal Medicine

## 2019-08-24 ENCOUNTER — Ambulatory Visit
Admission: RE | Admit: 2019-08-24 | Discharge: 2019-08-24 | Disposition: A | Discharge status: Home / Self Care | Payer: Medicare HMO | Source: Ambulatory Visit | Attending: Nurse Practitioner | Admitting: Nurse Practitioner

## 2019-08-24 DIAGNOSIS — Z13820 Encounter for screening for osteoporosis: Secondary | ICD-10-CM | POA: Insufficient documentation

## 2019-08-24 DIAGNOSIS — Z1239 Encounter for other screening for malignant neoplasm of breast: Secondary | ICD-10-CM

## 2019-08-24 DIAGNOSIS — M810 Age-related osteoporosis without current pathological fracture: Secondary | ICD-10-CM | POA: Diagnosis not present

## 2019-08-24 DIAGNOSIS — Z780 Asymptomatic menopausal state: Secondary | ICD-10-CM | POA: Diagnosis not present

## 2019-08-24 DIAGNOSIS — Z1231 Encounter for screening mammogram for malignant neoplasm of breast: Secondary | ICD-10-CM | POA: Diagnosis not present

## 2019-08-24 DIAGNOSIS — M8588 Other specified disorders of bone density and structure, other site: Secondary | ICD-10-CM | POA: Diagnosis not present

## 2019-08-24 NOTE — Progress Notes (Signed)
Negative mammogram

## 2019-08-24 NOTE — Progress Notes (Signed)
Hey. Can we make video or phone visit for her in the next week or so to discuss her bone density test? Thanks.

## 2019-08-25 NOTE — Progress Notes (Signed)
Patient advised and scheduled appt for 09/14/19

## 2019-09-09 ENCOUNTER — Other Ambulatory Visit: Payer: Self-pay

## 2019-09-09 DIAGNOSIS — I10 Essential (primary) hypertension: Secondary | ICD-10-CM

## 2019-09-09 MED ORDER — AMLODIPINE BESYLATE 10 MG PO TABS: 10.0000 mg | ORAL_TABLET | Freq: Every day | ORAL | 5 refills | Status: DC

## 2019-09-14 ENCOUNTER — Ambulatory Visit: Payer: Medicare HMO | Admitting: Adult Health

## 2019-09-21 DIAGNOSIS — R69 Illness, unspecified: Secondary | ICD-10-CM | POA: Diagnosis not present

## 2019-09-22 ENCOUNTER — Encounter: Payer: Self-pay | Admitting: Adult Health

## 2019-09-22 ENCOUNTER — Ambulatory Visit (INDEPENDENT_AMBULATORY_CARE_PROVIDER_SITE_OTHER): Payer: Medicare HMO | Admitting: Adult Health

## 2019-09-22 ENCOUNTER — Other Ambulatory Visit: Payer: Self-pay

## 2019-09-22 VITALS — BP 140/72 | HR 72 | Resp 16 | Ht <= 58 in | Wt 137.0 lb

## 2019-09-22 DIAGNOSIS — I10 Essential (primary) hypertension: Secondary | ICD-10-CM | POA: Diagnosis not present

## 2019-09-22 DIAGNOSIS — M818 Other osteoporosis without current pathological fracture: Secondary | ICD-10-CM | POA: Diagnosis not present

## 2019-09-22 DIAGNOSIS — K219 Gastro-esophageal reflux disease without esophagitis: Secondary | ICD-10-CM

## 2019-09-22 NOTE — Progress Notes (Signed)
[facility] [place], [place] [address]  Internal MEDICINE  Telephone Visit  Patient Name: Cynthia Keith  [id number]  [id number]  Date of Service: 09/22/2019  I connected with the patient at [phone] by telephone and verified the patients identity using two identifiers.   I discussed the limitations, risks, security and privacy concerns of performing an evaluation and management service by telephone and the availability of in person appointments. I also discussed with the patient that there may be a patient responsible charge related to the service.  The patient expressed understanding and agrees to proceed.    Chief Complaint  Patient presents with  . Telephone Screen  . Medical Management of Chronic Issues    bone density results, need a change in the dos of omperasole per ins  . Telephone Assessment    HPI  Pt seen via video. Pt's bone density results show osteoporosis. The patient reports she is aware of this, and she has been taking an OTC supplement from the [place].  She has been on this for many years. She declined prescription osteoporosis treatment at this time.    Current Medication: Outpatient Encounter Medications as of 09/22/2019  Medication Sig  . amLODipine (NORVASC) 10 MG tablet Take 1 tablet (10 mg total) by mouth daily.  Marland Kitchen aspirin EC 81 MG tablet Take 81 mg by mouth daily.  . clobetasol (TEMOVATE) 0.05 % external solution PLEASE SEE ATTACHED FOR DETAILED DIRECTIONS  . Multiple Vitamin (MULTIVITAMIN) tablet Take 1 tablet by mouth daily.  Marland Kitchen omeprazole (PRILOSEC) 20 MG capsule Take 1 capsule (20 mg total) by mouth 2 (two) times daily before a meal.   No facility-administered encounter medications on file as of 09/22/2019.     Surgical History: Past Surgical History:  Procedure Laterality Date  . ABDOMINAL HYSTERECTOMY    . COLONOSCOPY WITH PROPOFOL N/A 12/12/2015   Procedure: COLONOSCOPY WITH PROPOFOL;  Surgeon: Manya Silvas, MD;  Location: [facility] ENDOSCOPY;  Service: Endoscopy;  Laterality: N/A;  . HAND SURGERY  2005    Medical History: Past Medical History:  Diagnosis Date  . GERD (gastroesophageal reflux disease)   . Hyperlipidemia   . Hypertension   . Plantar fasciitis   . Sleep apnea     Family History: Family History  Problem Relation Age of Onset  . Breast cancer Cousin   . Stroke Mother   . Heart disease Paternal Grandmother   . Cancer Paternal Grandfather     Social History   Socioeconomic History  . Marital status: Married    Spouse name: Not on file  . Number of children: Not on file  . Years of education: Not on file  . Highest education level: Not on file  Occupational History  . Not on file  Social Needs  . Financial resource strain: Not on file  . Food insecurity    Worry: Not on file    Inability: Not on file  . Transportation needs    Medical: Not on file    Non-medical: Not on file  Tobacco Use  . Smoking status: Never Smoker  . Smokeless tobacco: Never Used  Substance and Sexual Activity  . Alcohol use: Yes    Comment: ocassionally  . Drug use: No  . Sexual activity: Yes    Birth control/protection: Post-menopausal, Surgical  Lifestyle  . Physical activity    Days per week: Not on file    Minutes per session: Not on file  .  Stress: Not on file  Relationships  . Social Herbalist on phone: Not on file    Gets together: Not on file    Attends religious service: Not on file    Active member of club or organization: Not on file    Attends meetings of clubs or organizations: Not on file    Relationship status: Not on file  . Intimate partner violence    Fear of current or ex partner: Not on file    Emotionally abused: Not on file    Physically abused: Not on file    Forced sexual activity: Not on file  Other Topics Concern  . Not on file  Social History Narrative  . Not on file      Review of Systems  Constitutional: Negative for  chills, fatigue and unexpected weight change.  HENT: Negative for congestion, rhinorrhea, sneezing and sore throat.   Eyes: Negative for photophobia, pain and redness.  Respiratory: Negative for cough, chest tightness and shortness of breath.   Cardiovascular: Negative for chest pain and palpitations.  Gastrointestinal: Negative for abdominal pain, constipation, diarrhea, nausea and vomiting.  Endocrine: Negative.   Genitourinary: Negative for dysuria and frequency.  Musculoskeletal: Negative for arthralgias, back pain, joint swelling and neck pain.  Skin: Negative for rash.  Allergic/Immunologic: Negative.   Neurological: Negative for tremors and numbness.  Hematological: Negative for adenopathy. Does not bruise/bleed easily.  Psychiatric/Behavioral: Negative for behavioral problems and sleep disturbance. The patient is not nervous/anxious.     Vital Signs: BP 140/72   Pulse 72   Resp 16   Ht 4\' 8"  (1.422 m)   Wt 137 lb (62.1 kg)   BMI 30.71 kg/m    Observation/Objective:  Well sounding, NAD noted.    Assessment/Plan: 1. Other osteoporosis without current pathological fracture Patient not interested in prescription medication at this time she is taking over-the-counter supplements and we discussed the difference between prescription and OTC but she does not want to do any prescription medication this time.  2. Gastroesophageal reflux disease without esophagitis Controlled, continue current therapy  3. Essential hypertension Stable, continue present management.  General Counseling: abby yarboro understanding of the findings of today's phone visit and agrees with plan of treatment. I have discussed any further diagnostic evaluation that may be needed or ordered today. We also reviewed her medications today. she has been encouraged to call the office with any questions or concerns that should arise related to todays visit.    No orders of the defined types were placed  in this encounter.   No orders of the defined types were placed in this encounter.   Time spent: Alexander AGNP-C Internal medicine

## 2019-10-05 DIAGNOSIS — L820 Inflamed seborrheic keratosis: Secondary | ICD-10-CM | POA: Diagnosis not present

## 2019-10-05 DIAGNOSIS — D225 Melanocytic nevi of trunk: Secondary | ICD-10-CM | POA: Diagnosis not present

## 2019-10-05 DIAGNOSIS — L308 Other specified dermatitis: Secondary | ICD-10-CM | POA: Diagnosis not present

## 2019-10-12 ENCOUNTER — Other Ambulatory Visit: Payer: Self-pay

## 2019-10-12 ENCOUNTER — Encounter: Payer: Self-pay | Admitting: Adult Health

## 2019-10-12 ENCOUNTER — Ambulatory Visit (INDEPENDENT_AMBULATORY_CARE_PROVIDER_SITE_OTHER): Payer: Medicare HMO | Admitting: Adult Health

## 2019-10-12 VITALS — BP 144/88 | HR 69 | Temp 97.4°F | Resp 16 | Ht <= 58 in | Wt 140.0 lb

## 2019-10-12 DIAGNOSIS — R300 Dysuria: Secondary | ICD-10-CM | POA: Diagnosis not present

## 2019-10-12 DIAGNOSIS — I10 Essential (primary) hypertension: Secondary | ICD-10-CM

## 2019-10-12 DIAGNOSIS — N390 Urinary tract infection, site not specified: Secondary | ICD-10-CM

## 2019-10-12 DIAGNOSIS — R319 Hematuria, unspecified: Secondary | ICD-10-CM

## 2019-10-12 LAB — POCT URINALYSIS DIPSTICK
Bilirubin, UA: NEGATIVE
Glucose, UA: NEGATIVE
Ketones, UA: NEGATIVE
Nitrite, UA: NEGATIVE
Protein, UA: POSITIVE — AB
Spec Grav, UA: 1.01 (ref 1.010–1.025)
Urobilinogen, UA: 0.2 E.U./dL
pH, UA: 5 (ref 5.0–8.0)

## 2019-10-12 MED ORDER — AMOXICILLIN-POT CLAVULANATE 875-125 MG PO TABS: 1.0000 | ORAL_TABLET | Freq: Two times a day (BID) | ORAL | 0 refills | Status: DC

## 2019-10-12 NOTE — Progress Notes (Signed)
[facility] [place], [place] [address]  Internal MEDICINE  Office Visit Note  Patient Name: Cynthia Keith  [id number]  [id number]  Date of Service: 10/12/2019  Chief Complaint  Patient presents with  . Urinary Tract Infection     HPI Pt is here for a sick visit. She reports 4 days of dysuria, frequency, and burning.  She denies any fever.  She has been fatigued and feeling run down. She reports a history of UIT, however its been almost a year.    Current Medication:  Outpatient Encounter Medications as of 10/12/2019  Medication Sig  . amLODipine (NORVASC) 10 MG tablet Take 1 tablet (10 mg total) by mouth daily.  Marland Kitchen aspirin EC 81 MG tablet Take 81 mg by mouth daily.  . clobetasol (TEMOVATE) 0.05 % external solution PLEASE SEE ATTACHED FOR DETAILED DIRECTIONS  . Multiple Vitamin (MULTIVITAMIN) tablet Take 1 tablet by mouth daily.  Marland Kitchen omeprazole (PRILOSEC) 20 MG capsule Take 1 capsule (20 mg total) by mouth 2 (two) times daily before a meal.   No facility-administered encounter medications on file as of 10/12/2019.       Medical History: Past Medical History:  Diagnosis Date  . GERD (gastroesophageal reflux disease)   . Hyperlipidemia   . Hypertension   . Plantar fasciitis   . Sleep apnea      Vital Signs: BP (!) 144/88   Pulse 69   Temp (!) 97.4 F (36.3 C)   Resp 16   Ht 4\' 8"  (1.422 m)   Wt 140 lb (63.5 kg)   SpO2 96%   BMI 31.39 kg/m    Review of Systems  Constitutional: Negative for chills, fatigue and unexpected weight change.  HENT: Negative for congestion, rhinorrhea, sneezing and sore throat.   Eyes: Negative for photophobia, pain and redness.  Respiratory: Negative for cough, chest tightness and shortness of breath.   Cardiovascular: Negative for chest pain and palpitations.  Gastrointestinal: Negative for abdominal pain, constipation, diarrhea, nausea and vomiting.  Endocrine: Negative.   Genitourinary: Negative for  dysuria and frequency.  Musculoskeletal: Negative for arthralgias, back pain, joint swelling and neck pain.  Skin: Negative for rash.  Allergic/Immunologic: Negative.   Neurological: Negative for tremors and numbness.  Hematological: Negative for adenopathy. Does not bruise/bleed easily.  Psychiatric/Behavioral: Negative for behavioral problems and sleep disturbance. The patient is not nervous/anxious.     Physical Exam Vitals signs and nursing note reviewed.  Constitutional:      General: She is not in acute distress.    Appearance: She is well-developed. She is not diaphoretic.  HENT:     Head: Normocephalic and atraumatic.     Mouth/Throat:     Pharynx: No oropharyngeal exudate.  Eyes:     Pupils: Pupils are equal, round, and reactive to light.  Neck:     Musculoskeletal: Normal range of motion and neck supple.     Thyroid: No thyromegaly.     Vascular: No JVD.     Trachea: No tracheal deviation.  Cardiovascular:     Rate and Rhythm: Normal rate and regular rhythm.     Heart sounds: Normal heart sounds. No murmur. No friction rub. No gallop.   Pulmonary:     Effort: Pulmonary effort is normal. No respiratory distress.     Breath sounds: Normal breath sounds. No wheezing or rales.  Chest:     Chest wall: No tenderness.  Abdominal:     Palpations: Abdomen is soft.  Tenderness: There is no abdominal tenderness. There is no guarding.  Musculoskeletal: Normal range of motion.  Lymphadenopathy:     Cervical: No cervical adenopathy.  Skin:    General: Skin is warm and dry.  Neurological:     Mental Status: She is alert and oriented to person, place, and time.     Cranial Nerves: No cranial nerve deficit.  Psychiatric:        Behavior: Behavior normal.        Thought Content: Thought content normal.        Judgment: Judgment normal.    Assessment/Plan: 1. Urinary tract infection with hematuria, site unspecified Advised patient to take entire course of antibiotics as  prescribed with food. Pt should return to clinic in 7-10 days if symptoms fail to improve or new symptoms develop.  - CULTURE, URINE COMPREHENSIVE - amoxicillin-clavulanate (AUGMENTIN) 875-125 MG tablet; Take 1 tablet by mouth 2 (two) times daily.  Dispense: 14 tablet; Refill: 0  2. Essential hypertension Slightly elevated today, continue to monitor.   3. Dysuria - POCT Urinalysis Dipstick  General Counseling: Betel verbalizes understanding of the findings of todays visit and agrees with plan of treatment. I have discussed any further diagnostic evaluation that may be needed or ordered today. We also reviewed her medications today. she has been encouraged to call the office with any questions or concerns that should arise related to todays visit.   Orders Placed This Encounter  Procedures  . CULTURE, URINE COMPREHENSIVE  . POCT Urinalysis Dipstick    No orders of the defined types were placed in this encounter.   Time spent: 15 Minutes  This patient was seen by Orson Gear AGNP-C in Collaboration with Dr Lavera Guise as a part of collaborative care agreement.  Kendell Bane AGNP-C Internal Medicine

## 2019-10-15 LAB — CULTURE, URINE COMPREHENSIVE

## 2019-11-02 DIAGNOSIS — H18593 Other hereditary corneal dystrophies, bilateral: Secondary | ICD-10-CM | POA: Diagnosis not present

## 2019-11-02 DIAGNOSIS — Z961 Presence of intraocular lens: Secondary | ICD-10-CM | POA: Diagnosis not present

## 2019-11-05 ENCOUNTER — Telehealth: Payer: Self-pay

## 2019-11-05 NOTE — Telephone Encounter (Signed)
CONFIRMED AND SCREENED PATIENT FOR 11-16 APPOINTMENT.

## 2019-11-09 ENCOUNTER — Ambulatory Visit (INDEPENDENT_AMBULATORY_CARE_PROVIDER_SITE_OTHER): Payer: Medicare HMO | Admitting: Adult Health

## 2019-11-09 ENCOUNTER — Other Ambulatory Visit: Payer: Self-pay

## 2019-11-09 ENCOUNTER — Encounter: Payer: Self-pay | Admitting: Nurse Practitioner

## 2019-11-09 VITALS — BP 160/70 | HR 78 | Temp 97.9°F | Resp 16 | Ht <= 58 in | Wt 140.0 lb

## 2019-11-09 DIAGNOSIS — Z23 Encounter for immunization: Secondary | ICD-10-CM

## 2019-11-09 DIAGNOSIS — I10 Essential (primary) hypertension: Secondary | ICD-10-CM | POA: Diagnosis not present

## 2019-11-09 DIAGNOSIS — K219 Gastro-esophageal reflux disease without esophagitis: Secondary | ICD-10-CM | POA: Diagnosis not present

## 2019-11-09 DIAGNOSIS — R300 Dysuria: Secondary | ICD-10-CM

## 2019-11-09 DIAGNOSIS — G2581 Restless legs syndrome: Secondary | ICD-10-CM | POA: Diagnosis not present

## 2019-11-09 DIAGNOSIS — M818 Other osteoporosis without current pathological fracture: Secondary | ICD-10-CM | POA: Diagnosis not present

## 2019-11-09 DIAGNOSIS — E7849 Other hyperlipidemia: Secondary | ICD-10-CM

## 2019-11-09 LAB — POCT URINALYSIS DIPSTICK
Bilirubin, UA: NEGATIVE
Blood, UA: NEGATIVE
Glucose, UA: NEGATIVE
Ketones, UA: NEGATIVE
Leukocytes, UA: NEGATIVE
Nitrite, UA: NEGATIVE
Protein, UA: NEGATIVE
Spec Grav, UA: 1.01 (ref 1.010–1.025)
Urobilinogen, UA: 0.2 E.U./dL
pH, UA: 5 (ref 5.0–8.0)

## 2019-11-09 NOTE — Progress Notes (Signed)
[facility] [place], [place] [address]  Internal MEDICINE  Office Visit Note  Patient Name: Cynthia Keith  [id number]  [id number]  Date of Service: 11/22/2019  Chief Complaint  Patient presents with  . Hyperlipidemia  . Hypertension    HPI  Pt is here for follow up on HTN, HLD.  She reports her blood pressure has been good, however she has had an increased amount of stress recently with work.  She is a Licensed conveyancer, and has been very busy.  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  She feels like she is going to need to back away from these duties, because it is becoming to much to handle for her. Her BP today is 160/90, she reports she has been very busy today, and was rushing to get here.  She currently takes amlodipine in the evening.       Current Medication: Outpatient Encounter Medications as of 11/09/2019  Medication Sig  . amLODipine (NORVASC) 10 MG tablet Take 1 tablet (10 mg total) by mouth daily.  Marland Kitchen amoxicillin-clavulanate (AUGMENTIN) 875-125 MG tablet Take 1 tablet by mouth 2 (two) times daily.  Marland Kitchen aspirin EC 81 MG tablet Take 81 mg by mouth daily.  . clobetasol (TEMOVATE) 0.05 % external solution PLEASE SEE ATTACHED FOR DETAILED DIRECTIONS  . Multiple Vitamin (MULTIVITAMIN) tablet Take 1 tablet by mouth daily.  Marland Kitchen omeprazole (PRILOSEC) 20 MG capsule Take 1 capsule (20 mg total) by mouth 2 (two) times daily before a meal.   No facility-administered encounter medications on file as of 11/09/2019.     Surgical History: Past Surgical History:  Procedure Laterality Date  . ABDOMINAL HYSTERECTOMY    . COLONOSCOPY WITH PROPOFOL N/A 12/12/2015   Procedure: COLONOSCOPY WITH PROPOFOL;  Surgeon: Manya Silvas, MD;  Location: [facility] ENDOSCOPY;  Service: Endoscopy;  Laterality: N/A;  . HAND SURGERY  2005    Medical History: Past Medical History:  Diagnosis Date  . GERD (gastroesophageal reflux  disease)   . Hyperlipidemia   . Hypertension   . Plantar fasciitis   . Sleep apnea     Family History: Family History  Problem Relation Age of Onset  . Breast cancer Cousin   . Stroke Mother   . Heart disease Paternal Grandmother   . Cancer Paternal Grandfather     Social History   Socioeconomic History  . Marital status: Married    Spouse name: Not on file  . Number of children: Not on file  . Years of education: Not on file  . Highest education level: Not on file  Occupational History  . Not on file  Social Needs  . Financial resource strain: Not on file  . Food insecurity    Worry: Not on file    Inability: Not on file  . Transportation needs    Medical: Not on file    Non-medical: Not on file  Tobacco Use  . Smoking status: Never Smoker  . Smokeless tobacco: Never Used  Substance and Sexual Activity  . Alcohol use: Yes    Comment: ocassionally  . Drug use: No  . Sexual activity: Yes    Birth control/protection: Post-menopausal, Surgical  Lifestyle  . Physical activity    Days per week: Not on file    Minutes per session: Not on file  . Stress: Not on file  Relationships  . Social Herbalist on phone:  Not on file    Gets together: Not on file    Attends religious service: Not on file    Active member of club or organization: Not on file    Attends meetings of clubs or organizations: Not on file    Relationship status: Not on file  . Intimate partner violence    Fear of current or ex partner: Not on file    Emotionally abused: Not on file    Physically abused: Not on file    Forced sexual activity: Not on file  Other Topics Concern  . Not on file  Social History Narrative  . Not on file      Review of Systems  Constitutional: Negative for chills, fatigue and unexpected weight change.  HENT: Negative for congestion, rhinorrhea, sneezing and sore throat.   Eyes: Negative for photophobia, pain and redness.  Respiratory: Negative for  cough, chest tightness and shortness of breath.   Cardiovascular: Negative for chest pain and palpitations.  Gastrointestinal: Negative for abdominal pain, constipation, diarrhea, nausea and vomiting.  Endocrine: Negative.   Genitourinary: Negative for dysuria and frequency.  Musculoskeletal: Negative for arthralgias, back pain, joint swelling and neck pain.  Skin: Negative for rash.  Allergic/Immunologic: Negative.   Neurological: Negative for tremors and numbness.  Hematological: Negative for adenopathy. Does not bruise/bleed easily.  Psychiatric/Behavioral: Negative for behavioral problems and sleep disturbance. The patient is not nervous/anxious.     Vital Signs: BP (!) 160/70   Pulse 78   Temp 97.9 F (36.6 C)   Resp 16   Ht 4\' 8"  (1.422 m)   Wt 140 lb (63.5 kg)   SpO2 97%   BMI 31.39 kg/m    Physical Exam Vitals signs and nursing note reviewed.  Constitutional:      General: She is not in acute distress.    Appearance: She is well-developed. She is not diaphoretic.  HENT:     Head: Normocephalic and atraumatic.     Mouth/Throat:     Pharynx: No oropharyngeal exudate.  Eyes:     Pupils: Pupils are equal, round, and reactive to light.  Neck:     Musculoskeletal: Normal range of motion and neck supple.     Thyroid: No thyromegaly.     Vascular: No JVD.     Trachea: No tracheal deviation.  Cardiovascular:     Rate and Rhythm: Normal rate and regular rhythm.     Heart sounds: Normal heart sounds. No murmur. No friction rub. No gallop.   Pulmonary:     Effort: Pulmonary effort is normal. No respiratory distress.     Breath sounds: Normal breath sounds. No wheezing or rales.  Chest:     Chest wall: No tenderness.  Abdominal:     Palpations: Abdomen is soft.     Tenderness: There is no abdominal tenderness. There is no guarding.  Musculoskeletal: Normal range of motion.  Lymphadenopathy:     Cervical: No cervical adenopathy.  Skin:    General: Skin is warm and  dry.  Neurological:     Mental Status: She is alert and oriented to person, place, and time.     Cranial Nerves: No cranial nerve deficit.  Psychiatric:        Behavior: Behavior normal.        Thought Content: Thought content normal.        Judgment: Judgment normal.    Assessment/Plan: 1. Essential hypertension Slightly elevated today, pt is under stress from multiple people, and  she working alleviating some of this.  She knows to call if her BP remains elevated.   2. Other hyperlipidemia Pt does not take medication, she uses doterra vitamins for this.    3. Gastroesophageal reflux disease without esophagitis Overall doing well, continue present management.   4. Other osteoporosis without current pathological fracture Pt takes vitamins, had bone denisity one month ago.  Reports her hips have been better since starting vitamins.   5. Restless legs syndrome Still having intermittent issues, continue present management.   6. Dysuria Clear dipstick at todays visit.  - POCT Urinalysis Dipstick  General Counseling: ronneisha stellato understanding of the findings of todays visit and agrees with plan of treatment. I have discussed any further diagnostic evaluation that may be needed or ordered today. We also reviewed her medications today. she has been encouraged to call the office with any questions or concerns that should arise related to todays visit.    Orders Placed This Encounter  Procedures  . Flu Vaccine MDCK QUAD PF  . POCT Urinalysis Dipstick    No orders of the defined types were placed in this encounter.   Time spent: 25 Minutes   This patient was seen by Orson Gear AGNP-C in Collaboration with Dr Lavera Guise as a part of collaborative care agreement     Kendell Bane AGNP-C Internal medicine

## 2019-12-21 ENCOUNTER — Telehealth: Payer: Self-pay | Admitting: Urology

## 2019-12-21 DIAGNOSIS — N2889 Other specified disorders of kidney and ureter: Secondary | ICD-10-CM

## 2019-12-21 NOTE — Telephone Encounter (Signed)
It looks like there is an order already in and the ultrasound is scheduled 12/28

## 2019-12-21 NOTE — Telephone Encounter (Signed)
Last ov notes says pt needs a RUS prior to app? Does she? If so I need an order please   Thanks Sharyn Lull

## 2019-12-22 NOTE — Addendum Note (Signed)
Addended by: John Giovanni C on: 12/22/2019 06:00 PM   Modules accepted: Orders

## 2019-12-22 NOTE — Telephone Encounter (Signed)
That one has already been used and has been closed so it will not let me use it. Can you put another one in please. Some how they took it and attached it to an old order.

## 2020-01-12 ENCOUNTER — Ambulatory Visit (INDEPENDENT_AMBULATORY_CARE_PROVIDER_SITE_OTHER): Payer: Medicare HMO | Admitting: Adult Health

## 2020-01-12 ENCOUNTER — Encounter: Payer: Self-pay | Admitting: Adult Health

## 2020-01-12 VITALS — Ht <= 58 in | Wt 138.0 lb

## 2020-01-12 DIAGNOSIS — K219 Gastro-esophageal reflux disease without esophagitis: Secondary | ICD-10-CM

## 2020-01-12 DIAGNOSIS — I10 Essential (primary) hypertension: Secondary | ICD-10-CM

## 2020-01-12 DIAGNOSIS — M5432 Sciatica, left side: Secondary | ICD-10-CM

## 2020-01-12 MED ORDER — PREDNISONE 10 MG PO TABS: ORAL_TABLET | ORAL | 0 refills | Status: DC

## 2020-01-12 NOTE — Progress Notes (Signed)
[facility] [place], [place] [address]  Internal MEDICINE  Telephone Visit  Patient Name: Cynthia Keith  [id number]  [id number]  Date of Service: 01/12/2020  I connected with the patient at 329 by telephone and verified the patients identity using two identifiers.   I discussed the limitations, risks, security and privacy concerns of performing an evaluation and management service by telephone and the availability of in person appointments. I also discussed with the patient that there may be a patient responsible charge related to the service.  The patient expressed understanding and agrees to proceed.    Chief Complaint  Patient presents with  . Telephone Assessment  . Telephone Screen  . Hip Pain    LEFT    HPI  Pt seen via video.  She reports continued left hip pain. She describes left sciatic pain that runs down her leg.  She has been using motrin, and essential oils as well as, stretching exercises and tennis ball stretches that her massage therapist prescribed.  She feels as thou it is not getting better.  She denies ever having steroids for this pain. 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     Current Medication: Outpatient Encounter Medications as of 01/12/2020  Medication Sig  . amLODipine (NORVASC) 10 MG tablet Take 1 tablet (10 mg total) by mouth daily.  Marland Kitchen amoxicillin-clavulanate (AUGMENTIN) 875-125 MG tablet Take 1 tablet by mouth 2 (two) times daily.  Marland Kitchen aspirin EC 81 MG tablet Take 81 mg by mouth daily.  . clobetasol (TEMOVATE) 0.05 % external solution PLEASE SEE ATTACHED FOR DETAILED DIRECTIONS  . Multiple Vitamin (MULTIVITAMIN) tablet Take 1 tablet by mouth daily.  Marland Kitchen omeprazole (PRILOSEC) 20 MG capsule Take 1 capsule (20 mg total) by mouth 2 (two) times daily before a meal.  . predniSONE (DELTASONE) 10 MG tablet Use per dose pack   No facility-administered encounter medications on file as of 01/12/2020.    Surgical  History: Past Surgical History:  Procedure Laterality Date  . ABDOMINAL HYSTERECTOMY    . COLONOSCOPY WITH PROPOFOL N/A 12/12/2015   Procedure: COLONOSCOPY WITH PROPOFOL;  Surgeon: Manya Silvas, MD;  Location: [facility] ENDOSCOPY;  Service: Endoscopy;  Laterality: N/A;  . HAND SURGERY  2005    Medical History: Past Medical History:  Diagnosis Date  . GERD (gastroesophageal reflux disease)   . Hyperlipidemia   . Hypertension   . Plantar fasciitis   . Sleep apnea     Family History: Family History  Problem Relation Age of Onset  . Breast cancer Cousin   . Stroke Mother   . Heart disease Paternal Grandmother   . Cancer Paternal Grandfather     Social History   Socioeconomic History  . Marital status: Married    Spouse name: Not on file  . Number of children: Not on file  . Years of education: Not on file  . Highest education level: Not on file  Occupational History  . Not on file  Tobacco Use  . Smoking status: Never Smoker  . Smokeless tobacco: Never Used  Substance and Sexual Activity  . Alcohol use: Yes    Comment: ocassionally  . Drug use: No  . Sexual activity: Yes    Birth control/protection: Post-menopausal, Surgical  Other Topics Concern  . Not on file  Social History Narrative  . Not on file   Social Determinants of Health   Financial Resource Strain:   . Difficulty of  Paying Living Expenses: Not on file  Food Insecurity:   . Worried About Charity fundraiser in the Last Year: Not on file  . Ran Out of Food in the Last Year: Not on file  Transportation Needs:   . Lack of Transportation (Medical): Not on file  . Lack of Transportation (Non-Medical): Not on file  Physical Activity:   . Days of Exercise per Week: Not on file  . Minutes of Exercise per Session: Not on file  Stress:   . Feeling of Stress : Not on file  Social Connections:   . Frequency of Communication with Friends and Family: Not on file  . Frequency of Social Gatherings with  Friends and Family: Not on file  . Attends Religious Services: Not on file  . Active Member of Clubs or Organizations: Not on file  . Attends Archivist Meetings: Not on file  . Marital Status: Not on file  Intimate Partner Violence:   . Fear of Current or Ex-Partner: Not on file  . Emotionally Abused: Not on file  . Physically Abused: Not on file  . Sexually Abused: Not on file      Review of Systems  Constitutional: Negative for chills, fatigue and unexpected weight change.  HENT: Negative for congestion, rhinorrhea, sneezing and sore throat.   Eyes: Negative for photophobia, pain and redness.  Respiratory: Negative for cough, chest tightness and shortness of breath.   Cardiovascular: Negative for chest pain and palpitations.  Gastrointestinal: Negative for abdominal pain, constipation, diarrhea, nausea and vomiting.  Endocrine: Negative.   Genitourinary: Negative for dysuria and frequency.  Musculoskeletal: Negative for arthralgias, back pain, joint swelling and neck pain.       Left hip pain  Skin: Negative for rash.  Allergic/Immunologic: Negative.   Neurological: Negative for tremors and numbness.  Hematological: Negative for adenopathy. Does not bruise/bleed easily.  Psychiatric/Behavioral: Negative for behavioral problems and sleep disturbance. The patient is not nervous/anxious.     Vital Signs: Ht 4\' 8"  (1.422 m)   Wt 138 lb (62.6 kg)   BMI 30.94 kg/m    Observation/Objective: Well appearing, NAD noted.    Assessment/Plan: 1. Left sciatic nerve pain Take prednisone as directed, will follow up at regular appt.   - predniSONE (DELTASONE) 10 MG tablet; Use per dose pack  Dispense: 21 tablet; Refill: 0  2. Essential hypertension Stable, continue present management.  3. Gastroesophageal reflux disease without esophagitis No recent complaints, continue as before.  General Counseling: lanitra dalley understanding of the findings of today's phone  visit and agrees with plan of treatment. I have discussed any further diagnostic evaluation that may be needed or ordered today. We also reviewed her medications today. she has been encouraged to call the office with any questions or concerns that should arise related to todays visit.    No orders of the defined types were placed in this encounter.   Meds ordered this encounter  Medications  . predniSONE (DELTASONE) 10 MG tablet    Sig: Use per dose pack    Dispense:  21 tablet    Refill:  0    Time spent: Jay AGNP-C Internal medicine

## 2020-01-19 ENCOUNTER — Telehealth: Payer: Self-pay

## 2020-01-19 DIAGNOSIS — M461 Sacroiliitis, not elsewhere classified: Secondary | ICD-10-CM | POA: Diagnosis not present

## 2020-01-19 DIAGNOSIS — M47896 Other spondylosis, lumbar region: Secondary | ICD-10-CM | POA: Diagnosis not present

## 2020-01-19 NOTE — Telephone Encounter (Signed)
Patient has been advised to call [facility] [phone] and get seen by urgent care walk in. Beth

## 2020-01-25 ENCOUNTER — Other Ambulatory Visit: Payer: Self-pay

## 2020-01-25 ENCOUNTER — Ambulatory Visit
Admission: RE | Admit: 2020-01-25 | Discharge: 2020-01-25 | Disposition: A | Discharge status: Home / Self Care | Payer: Medicare HMO | Source: Ambulatory Visit | Attending: Urology | Admitting: Urology

## 2020-01-25 DIAGNOSIS — H0015 Chalazion left lower eyelid: Secondary | ICD-10-CM | POA: Diagnosis not present

## 2020-01-25 DIAGNOSIS — N2889 Other specified disorders of kidney and ureter: Secondary | ICD-10-CM | POA: Insufficient documentation

## 2020-01-27 DIAGNOSIS — M545 Low back pain: Secondary | ICD-10-CM | POA: Diagnosis not present

## 2020-01-27 DIAGNOSIS — M47896 Other spondylosis, lumbar region: Secondary | ICD-10-CM | POA: Diagnosis not present

## 2020-02-04 ENCOUNTER — Telehealth: Payer: Self-pay

## 2020-02-04 NOTE — Telephone Encounter (Signed)
Confirmed appointment with patient and screened for covid. klh 

## 2020-02-06 DIAGNOSIS — Z23 Encounter for immunization: Secondary | ICD-10-CM | POA: Diagnosis not present

## 2020-02-08 ENCOUNTER — Other Ambulatory Visit: Payer: Self-pay

## 2020-02-08 ENCOUNTER — Encounter: Payer: Self-pay | Admitting: Adult Health

## 2020-02-08 ENCOUNTER — Ambulatory Visit (INDEPENDENT_AMBULATORY_CARE_PROVIDER_SITE_OTHER): Payer: Medicare HMO | Admitting: Adult Health

## 2020-02-08 VITALS — BP 164/78 | HR 71 | Temp 97.3°F | Resp 16 | Ht <= 58 in | Wt 140.8 lb

## 2020-02-08 DIAGNOSIS — E7849 Other hyperlipidemia: Secondary | ICD-10-CM

## 2020-02-08 DIAGNOSIS — M818 Other osteoporosis without current pathological fracture: Secondary | ICD-10-CM

## 2020-02-08 DIAGNOSIS — I10 Essential (primary) hypertension: Secondary | ICD-10-CM | POA: Diagnosis not present

## 2020-02-08 DIAGNOSIS — G2581 Restless legs syndrome: Secondary | ICD-10-CM

## 2020-02-08 DIAGNOSIS — K219 Gastro-esophageal reflux disease without esophagitis: Secondary | ICD-10-CM

## 2020-02-08 NOTE — Progress Notes (Signed)
[facility] [place], [place] [address]  Internal MEDICINE  Office Visit Note  Patient Name: Cynthia Keith  [id number]  [id number]  Date of Service: 02/08/2020  Chief Complaint  Patient presents with  . Follow-up    pain in privacy area, can hardly walk  . Gastroesophageal Reflux  . Hyperlipidemia  . Hypertension  . Sleep Apnea    HPI  Pt is seen today for follow up.  She reports ongoing right hip pain. She has seen orthopedics.  They told her the osteoporosis in her pelvis is inflamed and they prescribed physical therapy.  She was unable to afford the copay but she did get the exercises and has been trying to do them.  She did have a vaginal prolapse in 2016 that was surgically corrected.  She was told at the time that because of her job standing on her feet.  She is due for a follow up with them soon.     Current Medication: Outpatient Encounter Medications as of 02/08/2020  Medication Sig  . amLODipine (NORVASC) 10 MG tablet Take 1 tablet (10 mg total) by mouth daily.  Marland Kitchen amoxicillin-clavulanate (AUGMENTIN) 875-125 MG tablet Take 1 tablet by mouth 2 (two) times daily.  Marland Kitchen aspirin EC 81 MG tablet Take 81 mg by mouth daily.  . clobetasol (TEMOVATE) 0.05 % external solution PLEASE SEE ATTACHED FOR DETAILED DIRECTIONS  . Multiple Vitamin (MULTIVITAMIN) tablet Take 1 tablet by mouth daily.  Marland Kitchen omeprazole (PRILOSEC) 20 MG capsule Take 1 capsule (20 mg total) by mouth 2 (two) times daily before a meal.  . predniSONE (DELTASONE) 10 MG tablet Use per dose pack   No facility-administered encounter medications on file as of 02/08/2020.    Surgical History: Past Surgical History:  Procedure Laterality Date  . ABDOMINAL HYSTERECTOMY    . COLONOSCOPY WITH PROPOFOL N/A 12/12/2015   Procedure: COLONOSCOPY WITH PROPOFOL;  Surgeon: Manya Silvas, MD;  Location: [facility] ENDOSCOPY;  Service: Endoscopy;  Laterality: N/A;  . HAND SURGERY  2005    Medical  History: Past Medical History:  Diagnosis Date  . GERD (gastroesophageal reflux disease)   . Hyperlipidemia   . Hypertension   . Plantar fasciitis   . Sleep apnea     Family History: Family History  Problem Relation Age of Onset  . Breast cancer Cousin   . Stroke Mother   . Heart disease Paternal Grandmother   . Cancer Paternal Grandfather     Social History   Socioeconomic History  . Marital status: Married    Spouse name: Not on file  . Number of children: Not on file  . Years of education: Not on file  . Highest education level: Not on file  Occupational History  . Not on file  Tobacco Use  . Smoking status: Never Smoker  . Smokeless tobacco: Never Used  Substance and Sexual Activity  . Alcohol use: Yes    Comment: ocassionally  . Drug use: No  . Sexual activity: Yes    Birth control/protection: Post-menopausal, Surgical  Other Topics Concern  . Not on file  Social History Narrative  . Not on file   Social Determinants of Health   Financial Resource Strain:   . Difficulty of Paying Living Expenses: Not on file  Food Insecurity:   . Worried About Charity fundraiser in the Last Year: Not on file  . Ran Out of Food in the Last Year: Not on file  Transportation Needs:   .  Lack of Transportation (Medical): Not on file  . Lack of Transportation (Non-Medical): Not on file  Physical Activity:   . Days of Exercise per Week: Not on file  . Minutes of Exercise per Session: Not on file  Stress:   . Feeling of Stress : Not on file  Social Connections:   . Frequency of Communication with Friends and Family: Not on file  . Frequency of Social Gatherings with Friends and Family: Not on file  . Attends Religious Services: Not on file  . Active Member of Clubs or Organizations: Not on file  . Attends Archivist Meetings: Not on file  . Marital Status: Not on file  Intimate Partner Violence:   . Fear of Current or Ex-Partner: Not on file  . Emotionally  Abused: Not on file  . Physically Abused: Not on file  . Sexually Abused: Not on file      Review of Systems  Constitutional: Negative for chills, fatigue and unexpected weight change.  HENT: Negative for congestion, rhinorrhea, sneezing and sore throat.   Eyes: Negative for photophobia, pain and redness.  Respiratory: Negative for cough, chest tightness and shortness of breath.   Cardiovascular: Negative for chest pain and palpitations.  Gastrointestinal: Negative for abdominal pain, constipation, diarrhea, nausea and vomiting.  Endocrine: Negative.   Genitourinary: Negative for dysuria and frequency.  Musculoskeletal: Negative for arthralgias, back pain, joint swelling and neck pain.  Skin: Negative for rash.  Allergic/Immunologic: Negative.   Neurological: Negative for tremors and numbness.  Hematological: Negative for adenopathy. Does not bruise/bleed easily.  Psychiatric/Behavioral: Negative for behavioral problems and sleep disturbance. The patient is not nervous/anxious.     Vital Signs: BP (!) 164/78   Pulse 71   Temp (!) 97.3 F (36.3 C)   Resp 16   Ht 4\' 8"  (1.422 m)   Wt 140 lb 12.8 oz (63.9 kg)   SpO2 99%   BMI 31.57 kg/m    Physical Exam Vitals and nursing note reviewed.  Constitutional:      General: She is not in acute distress.    Appearance: She is well-developed. She is not diaphoretic.  HENT:     Head: Normocephalic and atraumatic.     Mouth/Throat:     Pharynx: No oropharyngeal exudate.  Eyes:     Pupils: Pupils are equal, round, and reactive to light.  Neck:     Thyroid: No thyromegaly.     Vascular: No JVD.     Trachea: No tracheal deviation.  Cardiovascular:     Rate and Rhythm: Normal rate and regular rhythm.     Heart sounds: Normal heart sounds. No murmur. No friction rub. No gallop.   Pulmonary:     Effort: Pulmonary effort is normal. No respiratory distress.     Breath sounds: Normal breath sounds. No wheezing or rales.  Chest:      Chest wall: No tenderness.  Abdominal:     Palpations: Abdomen is soft.     Tenderness: There is no abdominal tenderness. There is no guarding.  Musculoskeletal:        General: Normal range of motion.     Cervical back: Normal range of motion and neck supple.  Lymphadenopathy:     Cervical: No cervical adenopathy.  Skin:    General: Skin is warm and dry.  Neurological:     Mental Status: She is alert and oriented to person, place, and time.     Cranial Nerves: No cranial nerve deficit.  Psychiatric:        Behavior: Behavior normal.        Thought Content: Thought content normal.        Judgment: Judgment normal.    Assessment/Plan: 1. Essential hypertension Stable, pts BP at home is well controlled.   2. Gastroesophageal reflux disease without esophagitis Stable, continue omeprazole as prescribed.   3. Other hyperlipidemia Stable, continue to follow  4. Other osteoporosis without current pathological fracture Discussed RX for fosamax or similar med.  She declined at this time.   5. Restless legs syndrome Continue to use essential oils as before.   General Counseling: joleena pezza understanding of the findings of todays visit and agrees with plan of treatment. I have discussed any further diagnostic evaluation that may be needed or ordered today. We also reviewed her medications today. she has been encouraged to call the office with any questions or concerns that should arise related to todays visit.    No orders of the defined types were placed in this encounter.   No orders of the defined types were placed in this encounter.   Time spent: 25 Minutes   This patient was seen by Orson Gear AGNP-C in Collaboration with Dr Lavera Guise as a part of collaborative care agreement     Kendell Bane AGNP-C Internal medicine

## 2020-02-10 DIAGNOSIS — N816 Rectocele: Secondary | ICD-10-CM | POA: Diagnosis not present

## 2020-02-10 DIAGNOSIS — R102 Pelvic and perineal pain: Secondary | ICD-10-CM | POA: Diagnosis not present

## 2020-02-15 ENCOUNTER — Other Ambulatory Visit: Payer: Self-pay

## 2020-02-15 ENCOUNTER — Ambulatory Visit: Payer: Medicare HMO | Admitting: Urology

## 2020-02-15 ENCOUNTER — Encounter: Payer: Self-pay | Admitting: Urology

## 2020-02-15 VITALS — BP 153/81 | HR 81 | Ht <= 58 in | Wt 140.0 lb

## 2020-02-15 DIAGNOSIS — N2889 Other specified disorders of kidney and ureter: Secondary | ICD-10-CM | POA: Diagnosis not present

## 2020-02-15 NOTE — Progress Notes (Signed)
02/15/2020 9:20 AM   Cynthia Keith 1945/12/29 [id number]  Referring provider: Lavera Guise, [place],  [place] [address]  Chief Complaint  Patient presents with  . Follow-up    Urologic History 1. Renal Mass - 2 x 1.5 x 1.4 cm enhancing right renal mass MRI 2009 - Surveillance elective   HPI: 74 y.o. female presents for annual follow-up.  Former patient of Dr. Jacqlyn Larsen who has been followed for a small renal mass which has been stable since 2009.  Since her visit last year she has done well and has no voiding complaints.  Denies flank, abdominal or pelvic pain.  No dysuria or gross hematuria.  Renal ultrasound performed 02/2019 showed a stable 1.6 x 1.3 x 1.2 cm exophytic right renal mass.  Follow-up annual ultrasound 01/2020 shows a stable mass measuring 1.2 x 1.2 x 1.4 cm.  PMH: Past Medical History:  Diagnosis Date  . GERD (gastroesophageal reflux disease)   . Hyperlipidemia   . Hypertension   . Plantar fasciitis   . Sleep apnea     Surgical History: Past Surgical History:  Procedure Laterality Date  . ABDOMINAL HYSTERECTOMY    . COLONOSCOPY WITH PROPOFOL N/A 12/12/2015   Procedure: COLONOSCOPY WITH PROPOFOL;  Surgeon: Manya Silvas, MD;  Location: [facility] ENDOSCOPY;  Service: Endoscopy;  Laterality: N/A;  . HAND SURGERY  2005    Home Medications:  Allergies as of 02/15/2020      Reactions   Iodinated Diagnostic Agents Anaphylaxis   Remote hx of respiratory arrest after IV iodine for kidney study. Immediately treated w/o sequela. No previous or subsequent IVP exposure. Remote hx of respiratory arrest after IV iodine for kidney study. Immediately treated w/o sequela. No previous or subsequent IVP exposure. Remote hx of respiratory arrest after IV iodine for kidney study. Immediately treated w/o sequela. No previous or subsequent IVP exposure.   Iodine Shortness Of Breath   "Quit Breathing"      Medication List       Accurate as of February 15, 2020  9:20 AM. If you have any questions, ask your nurse or doctor.        amLODipine 10 MG tablet Commonly known as: NORVASC Take 1 tablet (10 mg total) by mouth daily.   amoxicillin-clavulanate 875-125 MG tablet Commonly known as: AUGMENTIN Take 1 tablet by mouth 2 (two) times daily.   aspirin EC 81 MG tablet Take 81 mg by mouth daily.   clobetasol 0.05 % external solution Commonly known as: TEMOVATE PLEASE SEE ATTACHED FOR DETAILED DIRECTIONS   multivitamin tablet Take 1 tablet by mouth daily.   omeprazole 20 MG capsule Commonly known as: PRILOSEC Take 1 capsule (20 mg total) by mouth 2 (two) times daily before a meal.   predniSONE 10 MG tablet Commonly known as: DELTASONE Use per dose pack       Allergies:  Allergies  Allergen Reactions  . Iodinated Diagnostic Agents Anaphylaxis    Remote hx of respiratory arrest after IV iodine for kidney study. Immediately treated w/o sequela. No previous or subsequent IVP exposure. Remote hx of respiratory arrest after IV iodine for kidney study. Immediately treated w/o sequela. No previous or subsequent IVP exposure. Remote hx of respiratory arrest after IV iodine for kidney study. Immediately treated w/o sequela. No previous or subsequent IVP exposure.  . Iodine Shortness Of Breath    "Quit Breathing"    Family History: Family History  Problem Relation Age of Onset  .  Breast cancer Cousin   . Stroke Mother   . Heart disease Paternal Grandmother   . Cancer Paternal Grandfather     Social History:  reports that she has never smoked. She has never used smokeless tobacco. She reports current alcohol use. She reports that she does not use drugs.   Physical Exam: BP (!) 153/81   Pulse 81   Ht 4\' 8"  (1.422 m)   Wt 140 lb (63.5 kg)   BMI 31.39 kg/m   Constitutional:  Alert and oriented, No acute distress. HEENT: [place] AT, moist mucus membranes.  Trachea midline, no masses. Cardiovascular: No clubbing, cyanosis, or  edema. Respiratory: Normal respiratory effort, no increased work of breathing.. Neurologic: Grossly intact, no focal deficits, moving all 4 extremities. Psychiatric: Normal mood and affect.   Pertinent Imaging:  Results for orders placed during the hospital encounter of 01/25/20  Ultrasound renal complete   Narrative CLINICAL DATA:  74 year old female with renal mass surveillance.  EXAM: RENAL / URINARY TRACT ULTRASOUND COMPLETE  COMPARISON:  Renal ultrasound dated 03/13/2019.  FINDINGS: Right Kidney:  Renal measurements: 9.4 x 4.4 x 5.3 cm = volume: 116 mL. Normal echogenicity. No hydronephrosis or shadowing stone. There is a 1.2 x 1.2 x 1.4 cm hypoechoic lesion with no demonstrable internal flow on color Doppler in the mid to lower pole of the right kidney laterally which is relatively similar to the lesion seen on the prior ultrasound. This lesion is not characterized on this study. This can be better characterized with dedicated renal mass protocol CT or MRI if clinically indicated. Similar lesion was reported on the MRI dating back to 06/08/2010.  Left Kidney:  Renal measurements: 9.5 x 5.9 x 4.1 cm = volume: 120 mL. Normal echogenicity. No hydronephrosis or shadowing stone.  Bladder:  Appears normal for degree of bladder distention.  Other:  None.  IMPRESSION: 1. No significant interval change in the right renal lesion compared to the ultrasound of 02/23/2019. 2. No hydronephrosis or shadowing stone.   Electronically Signed   By: Anner Crete M.D.   On: 01/25/2020 15:52     Assessment & Plan:    - Right renal mass Her mass has actually decreased in size over the last several years.  She desires to continue surveillance and recommend 1 year follow-up with renal ultrasound.   Abbie Sons, [place] [address], [place], [place] [address] [phone]

## 2020-03-05 DIAGNOSIS — Z23 Encounter for immunization: Secondary | ICD-10-CM | POA: Diagnosis not present

## 2020-03-09 ENCOUNTER — Telehealth: Payer: Self-pay

## 2020-03-09 DIAGNOSIS — I10 Essential (primary) hypertension: Secondary | ICD-10-CM

## 2020-03-09 MED ORDER — AMLODIPINE BESYLATE 10 MG PO TABS: 10.0000 mg | ORAL_TABLET | Freq: Every day | ORAL | 5 refills | Status: DC

## 2020-03-09 NOTE — Telephone Encounter (Signed)
REFILL FOR AMLODIPINE.

## 2020-03-25 ENCOUNTER — Other Ambulatory Visit: Payer: Self-pay | Admitting: Adult Health

## 2020-03-25 MED ORDER — NITROFURANTOIN MONOHYD MACRO 100 MG PO CAPS: 100.0000 mg | ORAL_CAPSULE | Freq: Two times a day (BID) | ORAL | 0 refills | Status: DC

## 2020-03-25 NOTE — Progress Notes (Signed)
Sent RX for Macrobid for dysuria, and frequency.

## 2020-03-28 DIAGNOSIS — R69 Illness, unspecified: Secondary | ICD-10-CM | POA: Diagnosis not present

## 2020-05-05 ENCOUNTER — Telehealth: Payer: Self-pay

## 2020-05-05 NOTE — Telephone Encounter (Signed)
Confirmed appointment on 05/09/2020 and screened for covid. klh 

## 2020-05-09 ENCOUNTER — Encounter: Payer: Self-pay | Admitting: Adult Health

## 2020-05-09 ENCOUNTER — Ambulatory Visit (INDEPENDENT_AMBULATORY_CARE_PROVIDER_SITE_OTHER): Payer: Medicare HMO | Admitting: Adult Health

## 2020-05-09 ENCOUNTER — Other Ambulatory Visit: Payer: Self-pay

## 2020-05-09 VITALS — BP 140/82 | HR 67 | Temp 97.2°F | Resp 16 | Ht <= 58 in | Wt 141.1 lb

## 2020-05-09 DIAGNOSIS — K219 Gastro-esophageal reflux disease without esophagitis: Secondary | ICD-10-CM | POA: Diagnosis not present

## 2020-05-09 DIAGNOSIS — M791 Myalgia, unspecified site: Secondary | ICD-10-CM | POA: Diagnosis not present

## 2020-05-09 DIAGNOSIS — I10 Essential (primary) hypertension: Secondary | ICD-10-CM | POA: Diagnosis not present

## 2020-05-09 DIAGNOSIS — M818 Other osteoporosis without current pathological fracture: Secondary | ICD-10-CM

## 2020-05-09 DIAGNOSIS — E7849 Other hyperlipidemia: Secondary | ICD-10-CM

## 2020-05-09 NOTE — Progress Notes (Signed)
[facility] [place],  [address]  Internal MEDICINE  Office Visit Note  Patient Name: Cynthia Keith  [id number]  [id number]  Date of Service: 05/09/2020  Chief Complaint  Patient presents with  . Follow-up  . Gastroesophageal Reflux  . Hypertension  . Hyperlipidemia    HPI  Pt is here for follow up on GERD, HTN, and HLD. Pt reports she is doing well.  Her blood pressure is slightly elevated today, she Denies Chest pain, Shortness of breath, palpitations, headache, or blurred vision. Her GERD and HLD are currently controlled. She reports an increase in lower extremity muscle pain.  She has a history of rhabdomyolysis in the past (2018). She denies any other complaints at this time.       Current Medication: Outpatient Encounter Medications as of 05/09/2020  Medication Sig  . amLODipine (NORVASC) 10 MG tablet Take 1 tablet (10 mg total) by mouth daily.  Marland Kitchen aspirin EC 81 MG tablet Take 81 mg by mouth daily.  . Multiple Vitamin (MULTIVITAMIN) tablet Take 1 tablet by mouth daily.  . [DISCONTINUED] amoxicillin-clavulanate (AUGMENTIN) 875-125 MG tablet Take 1 tablet by mouth 2 (two) times daily. (Patient not taking: Reported on 02/15/2020)  . [DISCONTINUED] clobetasol (TEMOVATE) 0.05 % external solution PLEASE SEE ATTACHED FOR DETAILED DIRECTIONS  . [DISCONTINUED] nitrofurantoin, macrocrystal-monohydrate, (MACROBID) 100 MG capsule Take 1 capsule (100 mg total) by mouth 2 (two) times daily.  . [DISCONTINUED] omeprazole (PRILOSEC) 20 MG capsule Take 1 capsule (20 mg total) by mouth 2 (two) times daily before a meal.  . [DISCONTINUED] predniSONE (DELTASONE) 10 MG tablet Use per dose pack (Patient not taking: Reported on 02/15/2020)   No facility-administered encounter medications on file as of 05/09/2020.    Surgical History: Past Surgical History:  Procedure Laterality Date  . ABDOMINAL HYSTERECTOMY    . COLONOSCOPY WITH PROPOFOL N/A 12/12/2015    Procedure: COLONOSCOPY WITH PROPOFOL;  Surgeon: Manya Silvas, MD;  Location: [facility] ENDOSCOPY;  Service: Endoscopy;  Laterality: N/A;  . HAND SURGERY  2005    Medical History: Past Medical History:  Diagnosis Date  . GERD (gastroesophageal reflux disease)   . Hyperlipidemia   . Hypertension   . Plantar fasciitis   . Sleep apnea     Family History: Family History  Problem Relation Age of Onset  . Breast cancer Cousin   . Stroke Mother   . Heart disease Paternal Grandmother   . Cancer Paternal Grandfather     Social History   Socioeconomic History  . Marital status: Married    Spouse name: Not on file  . Number of children: Not on file  . Years of education: Not on file  . Highest education level: Not on file  Occupational History  . Not on file  Tobacco Use  . Smoking status: Never Smoker  . Smokeless tobacco: Never Used  Substance and Sexual Activity  . Alcohol use: Yes    Comment: ocassionally  . Drug use: No  . Sexual activity: Yes    Birth control/protection: Post-menopausal, Surgical  Other Topics Concern  . Not on file  Social History Narrative  . Not on file   Social Determinants of Health   Financial Resource Strain:   . Difficulty of Paying Living Expenses:   Food Insecurity:   . Worried About Charity fundraiser in the Last Year:   . [place] in the Last Year:   Transportation Needs:   . Lack of  Transportation (Medical):   Marland Kitchen Lack of Transportation (Non-Medical):   Physical Activity:   . Days of Exercise per Week:   . Minutes of Exercise per Session:   Stress:   . Feeling of Stress :   Social Connections:   . Frequency of Communication with Friends and Family:   . Frequency of Social Gatherings with Friends and Family:   . Attends Religious Services:   . Active Member of Clubs or Organizations:   . Attends Archivist Meetings:   Marland Kitchen Marital Status:   Intimate Partner Violence:   . Fear of Current or Ex-Partner:   .  Emotionally Abused:   Marland Kitchen Physically Abused:   . Sexually Abused:       Review of Systems  Constitutional: Negative for chills, fatigue and unexpected weight change.  HENT: Negative for congestion, rhinorrhea, sneezing and sore throat.   Eyes: Negative for photophobia, pain and redness.  Respiratory: Negative for cough, chest tightness and shortness of breath.   Cardiovascular: Negative for chest pain and palpitations.  Gastrointestinal: Negative for abdominal pain, constipation, diarrhea, nausea and vomiting.  Endocrine: Negative.   Genitourinary: Negative for dysuria and frequency.  Musculoskeletal: Negative for arthralgias, back pain, joint swelling and neck pain.  Skin: Negative for rash.  Allergic/Immunologic: Negative.   Neurological: Negative for tremors and numbness.  Hematological: Negative for adenopathy. Does not bruise/bleed easily.  Psychiatric/Behavioral: Negative for behavioral problems and sleep disturbance. The patient is not nervous/anxious.     Vital Signs: BP 140/82   Pulse 67   Temp (!) 97.2 F (36.2 C)   Resp 16   Ht 4\' 8"  (1.422 m)   Wt 141 lb 1.6 oz (64 kg)   SpO2 96%   BMI 31.63 kg/m    Physical Exam Vitals and nursing note reviewed.  Constitutional:      General: She is not in acute distress.    Appearance: She is well-developed. She is not diaphoretic.  HENT:     Head: Normocephalic and atraumatic.     Mouth/Throat:     Pharynx: No oropharyngeal exudate.  Eyes:     Pupils: Pupils are equal, round, and reactive to light.  Neck:     Thyroid: No thyromegaly.     Vascular: No JVD.     Trachea: No tracheal deviation.  Cardiovascular:     Rate and Rhythm: Normal rate and regular rhythm.     Heart sounds: Normal heart sounds. No murmur. No friction rub. No gallop.   Pulmonary:     Effort: Pulmonary effort is normal. No respiratory distress.     Breath sounds: Normal breath sounds. No wheezing or rales.  Chest:     Chest wall: No tenderness.   Abdominal:     Palpations: Abdomen is soft.     Tenderness: There is no abdominal tenderness. There is no guarding.  Musculoskeletal:        General: Normal range of motion.     Cervical back: Normal range of motion and neck supple.  Lymphadenopathy:     Cervical: No cervical adenopathy.  Skin:    General: Skin is warm and dry.  Neurological:     Mental Status: She is alert and oriented to person, place, and time.     Cranial Nerves: No cranial nerve deficit.  Psychiatric:        Behavior: Behavior normal.        Thought Content: Thought content normal.        Judgment: Judgment  normal.    Assessment/Plan: 1. Muscle pain Will have CK and CMP checked at this time.  encouraged patient to drink fluids and have labs drawn.  - CK Total (and CKMB) - Comprehensive metabolic panel  2. Essential hypertension Stable, continue current medications.   3. Gastroesophageal reflux disease without esophagitis Controlled, continue to monitor.  4. Other hyperlipidemia Will continue to monitor, not current on medications.   5. Other osteoporosis without current pathological fracture Discussed osteoporosis treatment, she once again would like to wait and continue otc calcium.   General Counseling: ellorie bro understanding of the findings of todays visit and agrees with plan of treatment. I have discussed any further diagnostic evaluation that may be needed or ordered today. We also reviewed her medications today. she has been encouraged to call the office with any questions or concerns that should arise related to todays visit.    Orders Placed This Encounter  Procedures  . CK Total (and CKMB)  . Comprehensive metabolic panel    No orders of the defined types were placed in this encounter.   Time spent: 30 Minutes   This patient was seen by Orson Gear AGNP-C in Collaboration with Dr Lavera Guise as a part of collaborative care agreement     Kendell Bane  AGNP-C Internal medicine

## 2020-05-10 LAB — COMPREHENSIVE METABOLIC PANEL
ALT: 33 IU/L — ABNORMAL HIGH (ref 0–32)
AST: 36 IU/L (ref 0–40)
Albumin/Globulin Ratio: 1.6 (ref 1.2–2.2)
Albumin: 4.4 g/dL (ref 3.7–4.7)
Alkaline Phosphatase: 87 IU/L (ref 48–121)
BUN/Creatinine Ratio: 17 (ref 12–28)
BUN: 12 mg/dL (ref 8–27)
Bilirubin Total: 0.3 mg/dL (ref 0.0–1.2)
CO2: 24 mmol/L (ref 20–29)
Calcium: 10.1 mg/dL (ref 8.7–10.3)
Chloride: 102 mmol/L (ref 96–106)
Creatinine, Ser: 0.69 mg/dL (ref 0.57–1.00)
GFR calc Af Amer: 100 mL/min/{1.73_m2} (ref >59)
GFR calc non Af Amer: 87 mL/min/{1.73_m2} (ref >59)
Globulin, Total: 2.7 g/dL (ref 1.5–4.5)
Glucose: 90 mg/dL (ref 65–99)
Potassium: 4.4 mmol/L (ref 3.5–5.2)
Sodium: 142 mmol/L (ref 134–144)
Total Protein: 7.1 g/dL (ref 6.0–8.5)

## 2020-05-10 LAB — CK TOTAL AND CKMB (NOT AT ARMC)
CK-MB Index: 5.2 ng/mL (ref 0.0–5.3)
Total CK: 147 U/L (ref 32–182)

## 2020-05-11 ENCOUNTER — Telehealth: Payer: Self-pay

## 2020-05-11 NOTE — Telephone Encounter (Signed)
-----   Message from Kendell Bane, NP sent at 05/10/2020  8:48 AM EDT ----- Her CK level is normal.

## 2020-05-11 NOTE — Telephone Encounter (Signed)
Pt was notified.  

## 2020-05-16 DIAGNOSIS — M810 Age-related osteoporosis without current pathological fracture: Secondary | ICD-10-CM | POA: Diagnosis not present

## 2020-05-16 DIAGNOSIS — K219 Gastro-esophageal reflux disease without esophagitis: Secondary | ICD-10-CM | POA: Diagnosis not present

## 2020-05-16 DIAGNOSIS — Z791 Long term (current) use of non-steroidal anti-inflammatories (NSAID): Secondary | ICD-10-CM | POA: Diagnosis not present

## 2020-05-16 DIAGNOSIS — Z6833 Body mass index (BMI) 33.0-33.9, adult: Secondary | ICD-10-CM | POA: Diagnosis not present

## 2020-05-16 DIAGNOSIS — M1990 Unspecified osteoarthritis, unspecified site: Secondary | ICD-10-CM | POA: Diagnosis not present

## 2020-05-16 DIAGNOSIS — Z809 Family history of malignant neoplasm, unspecified: Secondary | ICD-10-CM | POA: Diagnosis not present

## 2020-05-16 DIAGNOSIS — E669 Obesity, unspecified: Secondary | ICD-10-CM | POA: Diagnosis not present

## 2020-05-16 DIAGNOSIS — I10 Essential (primary) hypertension: Secondary | ICD-10-CM | POA: Diagnosis not present

## 2020-05-16 DIAGNOSIS — G8929 Other chronic pain: Secondary | ICD-10-CM | POA: Diagnosis not present

## 2020-05-16 DIAGNOSIS — Z7982 Long term (current) use of aspirin: Secondary | ICD-10-CM | POA: Diagnosis not present

## 2020-05-23 ENCOUNTER — Other Ambulatory Visit: Payer: Self-pay | Admitting: Adult Health

## 2020-05-23 DIAGNOSIS — R350 Frequency of micturition: Secondary | ICD-10-CM | POA: Diagnosis not present

## 2020-05-23 DIAGNOSIS — R8290 Unspecified abnormal findings in urine: Secondary | ICD-10-CM | POA: Diagnosis not present

## 2020-05-23 DIAGNOSIS — N390 Urinary tract infection, site not specified: Secondary | ICD-10-CM | POA: Diagnosis not present

## 2020-05-30 ENCOUNTER — Encounter: Payer: Self-pay | Admitting: Podiatry

## 2020-05-30 ENCOUNTER — Other Ambulatory Visit: Payer: Self-pay

## 2020-05-30 ENCOUNTER — Ambulatory Visit: Payer: Medicare HMO | Admitting: Podiatry

## 2020-05-30 VITALS — Temp 98.2°F

## 2020-05-30 DIAGNOSIS — Q828 Other specified congenital malformations of skin: Secondary | ICD-10-CM

## 2020-05-30 NOTE — Progress Notes (Signed)
This patient returns to the office with a chief complaint of a callus on the bottom of her right forefoot.  She states she has been dealing with her this callus for years.  She was seen 1 year ago by myself.   She says she has started applying medicine to the site of this skin lesion right forefoot.   She says she discontinued the application on Friday and the pain has diminished.   She presents the office today for an evaluation and treatment of this painful skin lesion.  Vascular  Dorsalis pedis and posterior tibial pulses are palpable  B/L.  Capillary return  WNL.  Temperature gradient is  WNL.  Skin turgor  WNL  Sensorium  Senn Weinstein monofilament wire  WNL. Normal tactile sensation.  Nail Exam  Patient has normal nails with no evidence of bacterial or fungal infection.  Orthopedic  Exam  Muscle tone and muscle strength  WNL.  No limitations of motion feet  B/L.  No crepitus or joint effusion noted.  Foot type is unremarkable and digits show no abnormalities. HAV  B/L.  Skin  No open lesions.  Normal skin texture and turgor. Skin lesion sub 2 right foot with white colored skin covering porokeratosis.  No redness or infection noted.  Callus/porokeratosis right foot.    ROV.  Debride infected necrotic tissue sub 2 right foot with # 15 blade..  Padding applied.  Gel U pad paperwork dispensed. RTC prn.   Gardiner Barefoot DPM

## 2020-07-03 DIAGNOSIS — R6883 Chills (without fever): Secondary | ICD-10-CM | POA: Diagnosis not present

## 2020-07-05 ENCOUNTER — Other Ambulatory Visit: Payer: Self-pay

## 2020-07-05 ENCOUNTER — Other Ambulatory Visit: Payer: Self-pay | Admitting: Nurse Practitioner

## 2020-07-05 ENCOUNTER — Ambulatory Visit (INDEPENDENT_AMBULATORY_CARE_PROVIDER_SITE_OTHER): Payer: Medicare HMO | Admitting: Nurse Practitioner

## 2020-07-05 ENCOUNTER — Encounter: Payer: Self-pay | Admitting: Nurse Practitioner

## 2020-07-05 VITALS — BP 153/73 | HR 85 | Temp 99.0°F | Resp 16 | Ht <= 58 in | Wt 141.8 lb

## 2020-07-05 DIAGNOSIS — D509 Iron deficiency anemia, unspecified: Secondary | ICD-10-CM | POA: Diagnosis not present

## 2020-07-05 DIAGNOSIS — R52 Pain, unspecified: Secondary | ICD-10-CM | POA: Diagnosis not present

## 2020-07-05 DIAGNOSIS — R6883 Chills (without fever): Secondary | ICD-10-CM | POA: Diagnosis not present

## 2020-07-05 DIAGNOSIS — R5383 Other fatigue: Secondary | ICD-10-CM

## 2020-07-05 DIAGNOSIS — B999 Unspecified infectious disease: Secondary | ICD-10-CM

## 2020-07-05 DIAGNOSIS — D510 Vitamin B12 deficiency anemia due to intrinsic factor deficiency: Secondary | ICD-10-CM | POA: Diagnosis not present

## 2020-07-05 DIAGNOSIS — Z20822 Contact with and (suspected) exposure to covid-19: Secondary | ICD-10-CM | POA: Diagnosis not present

## 2020-07-05 MED ORDER — AZITHROMYCIN 250 MG PO TABS: ORAL_TABLET | ORAL | 0 refills | Status: DC

## 2020-07-05 NOTE — Progress Notes (Signed)
[facility] [place], [place] [address]  Internal MEDICINE  Office Visit Note  Patient Name: Cynthia Keith  [id number]  [id number]  Date of Service: 07/05/2020   Pt is here for a sick visit  Chief Complaint  Patient presents with  . Unable to get warm    Since Saturday evening, entire body feels cold and can not get warm     The patient presents for acute visit. Stats that she started having chills. Chills have been so bad that she had to have her electric blanket on along with two other blankets. Still couldn't get warm.  She did not have fever, headache, or body aches at the time. Later that day, she did develop headache. Was seen at Urgent care. Rapid COVID 19 test was run and results were negative. She has been fully vaccinated. Her symptoms continue to get worse. Body aches are getting worse. She has low grade fever today. She does not have appetite, but is not nauseated. Denies vomiting. She is having loose stools now. She still denies headache, but has severe chills and body aches.   .     Current Medication:  Outpatient Encounter Medications as of 07/05/2020  Medication Sig  . amLODipine (NORVASC) 10 MG tablet Take 1 tablet (10 mg total) by mouth daily.  Marland Kitchen aspirin EC 81 MG tablet Take 81 mg by mouth daily.  . diazepam (VALIUM) 5 MG tablet SMARTSIG:1 Tablet(s) Vaginal Every 12 Hours PRN  . Multiple Vitamin (MULTIVITAMIN) tablet Take 1 tablet by mouth daily.  Marland Kitchen tobramycin-dexamethasone (TOBRADEX) ophthalmic solution INSTILL 1 DROP INTO LEFT EYE 3 TIMES A DAY FOR 7 DAYS THEN ONCE DAILY FOR 7 MORE DAYS  . azithromycin (ZITHROMAX) 250 MG tablet z-pack - take as directed for 5 days   No facility-administered encounter medications on file as of 07/05/2020.      Medical History: Past Medical History:  Diagnosis Date  . GERD (gastroesophageal reflux disease)   . Hyperlipidemia   . Hypertension   . Plantar fasciitis   . Sleep apnea       Today's Vitals   07/05/20 0950  BP: (!) 153/73  Pulse: 85  Resp: 16  Temp: 99 F (37.2 C)  SpO2: 100%  Weight: 141 lb 12.8 oz (64.3 kg)  Height: 4\' 8"  (1.422 m)   Body mass index is 31.79 kg/m.  Review of Systems  Constitutional: Positive for appetite change, chills and fatigue. Negative for fever and unexpected weight change.  HENT: Negative for congestion, postnasal drip, rhinorrhea, sneezing and sore throat.   Respiratory: Negative for cough, chest tightness, shortness of breath and wheezing.   Cardiovascular: Negative for chest pain and palpitations.  Gastrointestinal: Positive for nausea. Negative for abdominal pain, constipation, diarrhea and vomiting.  Musculoskeletal: Positive for arthralgias and myalgias. Negative for back pain, joint swelling and neck pain.       Generalized joint and muscle pain.   Skin: Negative for rash.  Neurological: Negative for dizziness, tremors, numbness and headaches.  Hematological: Negative for adenopathy. Does not bruise/bleed easily.  Psychiatric/Behavioral: Negative for behavioral problems (Depression), sleep disturbance and suicidal ideas. The patient is not nervous/anxious.     Physical Exam Vitals and nursing note reviewed.  Constitutional:      General: She is not in acute distress.    Appearance: Normal appearance. She is well-developed. She is ill-appearing. She is not diaphoretic.  HENT:     Head: Normocephalic and atraumatic.     Right  Ear: External ear normal.     Left Ear: External ear normal.     Nose: Nose normal.     Mouth/Throat:     Pharynx: No oropharyngeal exudate.  Eyes:     Pupils: Pupils are equal, round, and reactive to light.  Neck:     Thyroid: No thyromegaly.     Vascular: No JVD.     Trachea: No tracheal deviation.  Cardiovascular:     Rate and Rhythm: Normal rate and regular rhythm.     Heart sounds: Normal heart sounds. No murmur heard.  No friction rub. No gallop.   Pulmonary:     Effort:  Pulmonary effort is normal. No respiratory distress.     Breath sounds: Normal breath sounds. No wheezing or rales.  Chest:     Chest wall: No tenderness.  Abdominal:     General: Bowel sounds are normal.     Palpations: Abdomen is soft.     Tenderness: There is no abdominal tenderness.  Musculoskeletal:        General: Normal range of motion.     Cervical back: Normal range of motion and neck supple.     Comments: Generalized joint and muscle pain present.   Lymphadenopathy:     Cervical: No cervical adenopathy.  Skin:    General: Skin is warm and dry.  Neurological:     General: No focal deficit present.     Mental Status: She is alert and oriented to person, place, and time.     Cranial Nerves: No cranial nerve deficit.  Psychiatric:        Mood and Affect: Mood normal.        Behavior: Behavior normal.        Thought Content: Thought content normal.        Judgment: Judgment normal.   Assessment/Plan: 1. Infection Unclear etiology. Patient with low grade fever, chills, and decreased appetite. Negative rapid COVID 19 test on Sunday. Will treat with z-pack. Recommend second, PCR COVID 19 test. Advised her to stay at home and out of work until she is retested and results are confirmed as negative.  - azithromycin (ZITHROMAX) 250 MG tablet; z-pack - take as directed for 5 days  Dispense: 6 tablet; Refill: 0  2. Chills Low grade fever. Treat as infection. Check labs for further evaluation.   3. Body aches Take OTC tylenol as needed and as indicated for body aches and pain.   4. Other fatigue Check labs for further evaluation.   General Counseling: Cynthia Keith understanding of the findings of todays visit and agrees with plan of treatment. I have discussed any further diagnostic evaluation that may be needed or ordered today. We also reviewed her medications today. she has been encouraged to call the office with any questions or concerns that should arise related to todays  visit.    Counseling:   This patient was seen by [place] with Dr Lavera Guise as a part of collaborative care agreement  Meds ordered this encounter  Medications  . azithromycin (ZITHROMAX) 250 MG tablet    Sig: z-pack - take as directed for 5 days    Dispense:  6 tablet    Refill:  0    Order Specific Question:   Supervising Provider    Answer:   Lavera Guise [[id number]]    Time spent: 25 Minutes

## 2020-07-06 LAB — HEPATIC FUNCTION PANEL
ALT: 55 IU/L — ABNORMAL HIGH (ref 0–32)
AST: 70 IU/L — ABNORMAL HIGH (ref 0–40)
Albumin: 4.4 g/dL (ref 3.7–4.7)
Alkaline Phosphatase: 82 IU/L (ref 48–121)
Bilirubin Total: 0.5 mg/dL (ref 0.0–1.2)
Bilirubin, Direct: 0.14 mg/dL (ref 0.00–0.40)
Total Protein: 7.1 g/dL (ref 6.0–8.5)

## 2020-07-06 LAB — T4, FREE: Free T4: 0.97 ng/dL (ref 0.82–1.77)

## 2020-07-06 LAB — IRON AND TIBC
Iron Saturation: 14 % — ABNORMAL LOW (ref 15–55)
Iron: 44 ug/dL (ref 27–139)
Total Iron Binding Capacity: 315 ug/dL (ref 250–450)
UIBC: 271 ug/dL (ref 118–369)

## 2020-07-06 LAB — B12 AND FOLATE PANEL
Folate: >20 ng/mL (ref >3.0)
Vitamin B-12: 1340 pg/mL — ABNORMAL HIGH (ref 232–1245)

## 2020-07-06 LAB — TSH: TSH: 1.59 u[IU]/mL (ref 0.450–4.500)

## 2020-07-06 LAB — CBC
Hematocrit: 40 % (ref 34.0–46.6)
Hemoglobin: 13.8 g/dL (ref 11.1–15.9)
MCH: 31.2 pg (ref 26.6–33.0)
MCHC: 34.5 g/dL (ref 31.5–35.7)
MCV: 91 fL (ref 79–97)
Platelets: 165 10*3/uL (ref 150–450)
RBC: 4.42 x10E6/uL (ref 3.77–5.28)
RDW: 12.5 % (ref 11.7–15.4)
WBC: 4.8 10*3/uL (ref 3.4–10.8)

## 2020-07-06 LAB — MAGNESIUM: Magnesium: 2.2 mg/dL (ref 1.6–2.3)

## 2020-07-06 LAB — PHOSPHORUS: Phosphorus: 3.2 mg/dL (ref 3.0–4.3)

## 2020-07-06 LAB — FERRITIN: Ferritin: 356 ng/mL — ABNORMAL HIGH (ref 15–150)

## 2020-07-10 NOTE — Progress Notes (Signed)
Waiting on all lab results. Would like to know if she were ever retested for COVID 19

## 2020-07-11 ENCOUNTER — Telehealth: Payer: Self-pay

## 2020-07-11 NOTE — Progress Notes (Signed)
Lab slip to be mailed to recheck liver functions and iron panel.

## 2020-07-11 NOTE — Telephone Encounter (Signed)
Please let her know that labs are showing elevated ferritin and liver functions. This may be a result of the acute illness she had when she did the labs. I am currently unclear of the reason. Please ask if she is taking any iron supplements. I would like for her to drink PLENTY of water. I will fill out a new lab slip to have these values rechecked in about two weeks. If still abnormal, will ultrasound the abdomen/liver. Thanks. Also, so glad she was negative for COVID and glad she is feeling better.

## 2020-07-11 NOTE — Telephone Encounter (Signed)
Left VM for pt to rtn call  dbs

## 2020-07-12 NOTE — Telephone Encounter (Signed)
Pt notified and mailed labslip

## 2020-08-01 ENCOUNTER — Other Ambulatory Visit: Payer: Self-pay | Admitting: Nurse Practitioner

## 2020-08-01 DIAGNOSIS — Z1231 Encounter for screening mammogram for malignant neoplasm of breast: Secondary | ICD-10-CM

## 2020-08-04 ENCOUNTER — Telehealth: Payer: Self-pay

## 2020-08-04 NOTE — Telephone Encounter (Signed)
Confirmed office visit on 8/16

## 2020-08-08 ENCOUNTER — Other Ambulatory Visit: Payer: Self-pay

## 2020-08-08 ENCOUNTER — Encounter: Payer: Self-pay | Admitting: Adult Health

## 2020-08-08 ENCOUNTER — Ambulatory Visit (INDEPENDENT_AMBULATORY_CARE_PROVIDER_SITE_OTHER): Payer: Medicare HMO | Admitting: Adult Health

## 2020-08-08 VITALS — BP 150/80 | HR 64 | Temp 97.5°F | Resp 16 | Ht <= 58 in | Wt 140.0 lb

## 2020-08-08 DIAGNOSIS — G2581 Restless legs syndrome: Secondary | ICD-10-CM | POA: Diagnosis not present

## 2020-08-08 DIAGNOSIS — E7849 Other hyperlipidemia: Secondary | ICD-10-CM

## 2020-08-08 DIAGNOSIS — R748 Abnormal levels of other serum enzymes: Secondary | ICD-10-CM | POA: Diagnosis not present

## 2020-08-08 DIAGNOSIS — R7989 Other specified abnormal findings of blood chemistry: Secondary | ICD-10-CM | POA: Diagnosis not present

## 2020-08-08 DIAGNOSIS — I10 Essential (primary) hypertension: Secondary | ICD-10-CM

## 2020-08-08 DIAGNOSIS — K219 Gastro-esophageal reflux disease without esophagitis: Secondary | ICD-10-CM

## 2020-08-08 NOTE — Progress Notes (Signed)
[facility] [place], [place] [address]  Internal MEDICINE  Office Visit Note  Patient Name: Cynthia Keith  [id number]  [id number]  Date of Service: 08/08/2020  Chief Complaint  Patient presents with  . Follow-up  . Hypertension  . Hyperlipidemia  . Sleep Apnea  . Quality Metric Gaps    TDAP, hepC    HPI  Pt is here for follow up on HTN, HLD, GERD, RLS.  She is currently doing well.  She does have some family issues she is working through, and believes that is why her blood pressure is elevated.  Denies Chest pain, Shortness of breath, palpitations, headache, or blurred vision.  She does not take medication for her HLD.  She uses otc meds and does not wish to use RX meds.  Her Jerrye Bushy and RLS are also well controlled.  On her recent labs she had  An elevated Ferritin level as well as elevated AST and ALT.      Current Medication: Outpatient Encounter Medications as of 08/08/2020  Medication Sig  . amLODipine (NORVASC) 10 MG tablet Take 1 tablet (10 mg total) by mouth daily.  Marland Kitchen aspirin EC 81 MG tablet Take 81 mg by mouth daily.  Marland Kitchen azithromycin (ZITHROMAX) 250 MG tablet z-pack - take as directed for 5 days  . diazepam (VALIUM) 5 MG tablet SMARTSIG:1 Tablet(s) Vaginal Every 12 Hours PRN  . Multiple Vitamin (MULTIVITAMIN) tablet Take 1 tablet by mouth daily.  Marland Kitchen tobramycin-dexamethasone (TOBRADEX) ophthalmic solution INSTILL 1 DROP INTO LEFT EYE 3 TIMES A DAY FOR 7 DAYS THEN ONCE DAILY FOR 7 MORE DAYS   No facility-administered encounter medications on file as of 08/08/2020.    Surgical History: Past Surgical History:  Procedure Laterality Date  . ABDOMINAL HYSTERECTOMY    . COLONOSCOPY WITH PROPOFOL N/A 12/12/2015   Procedure: COLONOSCOPY WITH PROPOFOL;  Surgeon: Manya Silvas, MD;  Location: [facility] ENDOSCOPY;  Service: Endoscopy;  Laterality: N/A;  . HAND SURGERY  2005    Medical History: Past Medical History:  Diagnosis Date  . GERD  (gastroesophageal reflux disease)   . Hyperlipidemia   . Hypertension   . Plantar fasciitis   . Sleep apnea     Family History: Family History  Problem Relation Age of Onset  . Breast cancer Cousin   . Stroke Mother   . Heart disease Paternal Grandmother   . Cancer Paternal Grandfather   . Stroke Sister   . Stroke Brother     Social History   Socioeconomic History  . Marital status: Married    Spouse name: Not on file  . Number of children: Not on file  . Years of education: Not on file  . Highest education level: Not on file  Occupational History  . Not on file  Tobacco Use  . Smoking status: Never Smoker  . Smokeless tobacco: Never Used  Vaping Use  . Vaping Use: Never used  Substance and Sexual Activity  . Alcohol use: Yes    Comment: ocassionally  . Drug use: No  . Sexual activity: Yes    Birth control/protection: Post-menopausal, Surgical  Other Topics Concern  . Not on file  Social History Narrative  . Not on file   Social Determinants of Health   Financial Resource Strain:   . Difficulty of Paying Living Expenses:   Food Insecurity:   . Worried About Charity fundraiser in the Last Year:   . [place] in the  Last Year:   Transportation Needs:   . Film/video editor (Medical):   Marland Kitchen Lack of Transportation (Non-Medical):   Physical Activity:   . Days of Exercise per Week:   . Minutes of Exercise per Session:   Stress:   . Feeling of Stress :   Social Connections:   . Frequency of Communication with Friends and Family:   . Frequency of Social Gatherings with Friends and Family:   . Attends Religious Services:   . Active Member of Clubs or Organizations:   . Attends Archivist Meetings:   Marland Kitchen Marital Status:   Intimate Partner Violence:   . Fear of Current or Ex-Partner:   . Emotionally Abused:   Marland Kitchen Physically Abused:   . Sexually Abused:       Review of Systems  Constitutional: Negative for chills, fatigue and unexpected  weight change.  HENT: Negative for congestion, rhinorrhea, sneezing and sore throat.   Eyes: Negative for photophobia, pain and redness.  Respiratory: Negative for cough, chest tightness and shortness of breath.   Cardiovascular: Negative for chest pain and palpitations.  Gastrointestinal: Negative for abdominal pain, constipation, diarrhea, nausea and vomiting.  Endocrine: Negative.   Genitourinary: Negative for dysuria and frequency.  Musculoskeletal: Negative for arthralgias, back pain, joint swelling and neck pain.  Skin: Negative for rash.  Allergic/Immunologic: Negative.   Neurological: Negative for tremors and numbness.  Hematological: Negative for adenopathy. Does not bruise/bleed easily.  Psychiatric/Behavioral: Negative for behavioral problems and sleep disturbance. The patient is not nervous/anxious.     Vital Signs: BP (!) 150/80   Pulse 64   Temp (!) 97.5 F (36.4 C)   Resp 16   Ht 4\' 8"  (1.422 m)   Wt 140 lb (63.5 kg)   SpO2 99%   BMI 31.39 kg/m    Physical Exam Vitals and nursing note reviewed.  Constitutional:      General: She is not in acute distress.    Appearance: She is well-developed. She is not diaphoretic.  HENT:     Head: Normocephalic and atraumatic.     Mouth/Throat:     Pharynx: No oropharyngeal exudate.  Eyes:     Pupils: Pupils are equal, round, and reactive to light.  Neck:     Thyroid: No thyromegaly.     Vascular: No JVD.     Trachea: No tracheal deviation.  Cardiovascular:     Rate and Rhythm: Normal rate and regular rhythm.     Heart sounds: Normal heart sounds. No murmur heard.  No friction rub. No gallop.   Pulmonary:     Effort: Pulmonary effort is normal. No respiratory distress.     Breath sounds: Normal breath sounds. No wheezing or rales.  Chest:     Chest wall: No tenderness.  Abdominal:     Palpations: Abdomen is soft.     Tenderness: There is no abdominal tenderness. There is no guarding.  Musculoskeletal:         General: Normal range of motion.     Cervical back: Normal range of motion and neck supple.  Lymphadenopathy:     Cervical: No cervical adenopathy.  Skin:    General: Skin is warm and dry.  Neurological:     Mental Status: She is alert and oriented to person, place, and time.     Cranial Nerves: No cranial nerve deficit.  Psychiatric:        Behavior: Behavior normal.        Thought Content:  Thought content normal.        Judgment: Judgment normal.    Assessment/Plan: 1. Essential hypertension Controlled, continue present management.   2. Gastroesophageal reflux disease without esophagitis Stable, continue present management.   3. Other hyperlipidemia Continue to monitor lipid panel.  4. Restless legs syndrome Continue to use essential oils as before.   5. Elevated ferritin level Elevated Ferritin over 300.   - Ambulatory referral to Hematology  6. Elevated liver enzymes Will need abdominal US to eval liver.  - US Abdomen Complete; Future  General Counseling: verleen stuckey understanding of the findings of todays visit and agrees with plan of treatment. I have discussed any further diagnostic evaluation that may be needed or ordered today. We also reviewed her medications today. she has been encouraged to call the office with any questions or concerns that should arise related to todays visit.    Orders Placed This Encounter  Procedures  . US Abdomen Complete  . Ambulatory referral to Hematology    No orders of the defined types were placed in this encounter.   Time spent: 25 Minutes   This patient was seen by Orson Gear AGNP-C in Collaboration with Dr Lavera Guise as a part of collaborative care agreement     Kendell Bane AGNP-C Internal medicine

## 2020-08-15 ENCOUNTER — Other Ambulatory Visit: Payer: Self-pay

## 2020-08-15 ENCOUNTER — Inpatient Hospital Stay: Payer: Medicare HMO

## 2020-08-15 ENCOUNTER — Inpatient Hospital Stay: Payer: Medicare HMO | Attending: Oncology | Admitting: Oncology

## 2020-08-15 ENCOUNTER — Encounter: Payer: Self-pay | Admitting: Oncology

## 2020-08-15 VITALS — BP 149/81 | HR 67 | Temp 97.8°F | Resp 20 | Wt 141.5 lb

## 2020-08-15 DIAGNOSIS — R7989 Other specified abnormal findings of blood chemistry: Secondary | ICD-10-CM | POA: Diagnosis not present

## 2020-08-15 DIAGNOSIS — Z7289 Other problems related to lifestyle: Secondary | ICD-10-CM | POA: Diagnosis not present

## 2020-08-15 DIAGNOSIS — R7401 Elevation of levels of liver transaminase levels: Secondary | ICD-10-CM

## 2020-08-15 DIAGNOSIS — Z809 Family history of malignant neoplasm, unspecified: Secondary | ICD-10-CM | POA: Diagnosis not present

## 2020-08-15 DIAGNOSIS — Z803 Family history of malignant neoplasm of breast: Secondary | ICD-10-CM

## 2020-08-15 LAB — CBC WITH DIFFERENTIAL/PLATELET
Abs Immature Granulocytes: 0.03 10*3/uL (ref 0.00–0.07)
Basophils Absolute: 0.1 10*3/uL (ref 0.0–0.1)
Basophils Relative: 1 %
Eosinophils Absolute: 0.3 10*3/uL (ref 0.0–0.5)
Eosinophils Relative: 4 %
HCT: 42.2 % (ref 36.0–46.0)
Hemoglobin: 14.7 g/dL (ref 12.0–15.0)
Immature Granulocytes: 0 %
Lymphocytes Relative: 32 %
Lymphs Abs: 2.5 10*3/uL (ref 0.7–4.0)
MCH: 31.5 pg (ref 26.0–34.0)
MCHC: 34.8 g/dL (ref 30.0–36.0)
MCV: 90.4 fL (ref 80.0–100.0)
Monocytes Absolute: 0.8 10*3/uL (ref 0.1–1.0)
Monocytes Relative: 11 %
Neutro Abs: 4.1 10*3/uL (ref 1.7–7.7)
Neutrophils Relative %: 52 %
Platelets: 257 10*3/uL (ref 150–400)
RBC: 4.67 MIL/uL (ref 3.87–5.11)
RDW: 12.7 % (ref 11.5–15.5)
WBC: 7.7 10*3/uL (ref 4.0–10.5)
nRBC: 0 % (ref 0.0–0.2)

## 2020-08-15 LAB — HEPATIC FUNCTION PANEL
ALT: 31 U/L (ref 0–44)
AST: 37 U/L (ref 15–41)
Albumin: 4.3 g/dL (ref 3.5–5.0)
Alkaline Phosphatase: 72 U/L (ref 38–126)
Bilirubin, Direct: 0.1 mg/dL (ref 0.0–0.2)
Indirect Bilirubin: 0.4 mg/dL (ref 0.3–0.9)
Total Bilirubin: 0.5 mg/dL (ref 0.3–1.2)
Total Protein: 8.1 g/dL (ref 6.5–8.1)

## 2020-08-15 NOTE — Progress Notes (Signed)
Hematology/Oncology Consult note [facility] [phone] [phone]   Patient Care Team: Lavera Guise, MD as PCP - General (Internal Medicine)  REFERRING PROVIDER: Kendell Bane, NP  CHIEF COMPLAINTS/REASON FOR VISIT:  Evaluation of elevated ferritin  HISTORY OF PRESENTING ILLNESS:   Cynthia Keith is a  74 y.o.  female with PMH listed below was seen in consultation at the request of  Kendell Bane, NP  for evaluation of elevated ferritin 07/05/2020, patient had blood work done which showed decreased iron saturation 14, ferritin level was increased at 356, TIBC 315.  Patient was referred to hematology for further evaluation for possible hemochromatosis/elevated ferritin. Patient reports feeling well at baseline today. Patient drinks red wine 4-5 days per week.  Occasionally she also drinks beer when she orders pizza. Denies any recent infection, new medication. Denies any family history of hemochromatosis. Review of Systems  Constitutional: Negative for appetite change, chills, fatigue and fever.  HENT:   Negative for hearing loss and voice change.   Eyes: Negative for eye problems.  Respiratory: Negative for chest tightness and cough.   Cardiovascular: Negative for chest pain.  Gastrointestinal: Negative for abdominal distention, abdominal pain and blood in stool.  Endocrine: Negative for hot flashes.  Genitourinary: Negative for difficulty urinating and frequency.   Musculoskeletal: Negative for arthralgias.  Skin: Negative for itching and rash.  Neurological: Negative for extremity weakness.  Hematological: Negative for adenopathy.  Psychiatric/Behavioral: Negative for confusion.    MEDICAL HISTORY:  Past Medical History:  Diagnosis Date  . GERD (gastroesophageal reflux disease)   . Hyperlipidemia   . Hypertension   . Plantar fasciitis   . Sleep apnea     SURGICAL HISTORY: Past Surgical History:  Procedure Laterality  Date  . ABDOMINAL HYSTERECTOMY    . COLONOSCOPY WITH PROPOFOL N/A 12/12/2015   Procedure: COLONOSCOPY WITH PROPOFOL;  Surgeon: Manya Silvas, MD;  Location: [facility] ENDOSCOPY;  Service: Endoscopy;  Laterality: N/A;  . HAND SURGERY  2005    SOCIAL HISTORY: Social History   Socioeconomic History  . Marital status: Married    Spouse name: Not on file  . Number of children: Not on file  . Years of education: Not on file  . Highest education level: Not on file  Occupational History  . Not on file  Tobacco Use  . Smoking status: Never Smoker  . Smokeless tobacco: Never Used  Vaping Use  . Vaping Use: Never used  Substance and Sexual Activity  . Alcohol use: Yes    Comment: ocassionally  . Drug use: No  . Sexual activity: Yes    Birth control/protection: Post-menopausal, Surgical  Other Topics Concern  . Not on file  Social History Narrative  . Not on file   Social Determinants of Health   Financial Resource Strain:   . Difficulty of Paying Living Expenses: Not on file  Food Insecurity:   . Worried About Charity fundraiser in the Last Year: Not on file  . Ran Out of Food in the Last Year: Not on file  Transportation Needs:   . Lack of Transportation (Medical): Not on file  . Lack of Transportation (Non-Medical): Not on file  Physical Activity:   . Days of Exercise per Week: Not on file  . Minutes of Exercise per Session: Not on file  Stress:   . Feeling of Stress : Not on file  Social Connections:   . Frequency of Communication with Friends and Family:  Not on file  . Frequency of Social Gatherings with Friends and Family: Not on file  . Attends Religious Services: Not on file  . Active Member of Clubs or Organizations: Not on file  . Attends Archivist Meetings: Not on file  . Marital Status: Not on file  Intimate Partner Violence:   . Fear of Current or Ex-Partner: Not on file  . Emotionally Abused: Not on file  . Physically Abused: Not on file  .  Sexually Abused: Not on file    FAMILY HISTORY: Family History  Problem Relation Age of Onset  . Breast cancer Cousin   . Stroke Mother   . Heart disease Paternal Grandmother   . Cancer Paternal Grandfather   . Stroke Sister   . Stroke Brother     ALLERGIES:  is allergic to iodinated diagnostic agents and iodine.  MEDICATIONS:  Current Outpatient Medications  Medication Sig Dispense Refill  . amLODipine (NORVASC) 10 MG tablet Take 1 tablet (10 mg total) by mouth daily. 30 tablet 5  . aspirin EC 81 MG tablet Take 81 mg by mouth daily.    Marland Kitchen azithromycin (ZITHROMAX) 250 MG tablet z-pack - take as directed for 5 days 6 tablet 0  . diazepam (VALIUM) 5 MG tablet SMARTSIG:1 Tablet(s) Vaginal Every 12 Hours PRN    . Multiple Vitamin (MULTIVITAMIN) tablet Take 1 tablet by mouth daily.    Marland Kitchen tobramycin-dexamethasone (TOBRADEX) ophthalmic solution INSTILL 1 DROP INTO LEFT EYE 3 TIMES A DAY FOR 7 DAYS THEN ONCE DAILY FOR 7 MORE DAYS     No current facility-administered medications for this visit.     PHYSICAL EXAMINATION: ECOG PERFORMANCE STATUS: 0 - Asymptomatic Vitals:   08/15/20 0934  BP: (!) 149/81  Pulse: 67  Resp: 20  Temp: 97.8 F (36.6 C)  SpO2: 100%   Filed Weights   08/15/20 0934  Weight: 141 lb 8 oz (64.2 kg)    Physical Exam Constitutional:      General: She is not in acute distress. HENT:     Head: Normocephalic and atraumatic.  Eyes:     General: No scleral icterus. Cardiovascular:     Rate and Rhythm: Normal rate and regular rhythm.     Heart sounds: Normal heart sounds.  Pulmonary:     Effort: Pulmonary effort is normal. No respiratory distress.     Breath sounds: No wheezing.  Abdominal:     General: Bowel sounds are normal. There is no distension.     Palpations: Abdomen is soft.  Musculoskeletal:        General: No deformity. Normal range of motion.     Cervical back: Normal range of motion and neck supple.  Skin:    General: Skin is warm and  dry.     Findings: No erythema or rash.  Neurological:     Mental Status: She is alert and oriented to person, place, and time. Mental status is at baseline.     Cranial Nerves: No cranial nerve deficit.     Coordination: Coordination normal.  Psychiatric:        Mood and Affect: Mood normal.     LABORATORY DATA:  I have reviewed the data as listed Lab Results  Component Value Date   WBC 7.7 08/15/2020   HGB 14.7 08/15/2020   HCT 42.2 08/15/2020   MCV 90.4 08/15/2020   PLT 257 08/15/2020   Recent Labs    05/09/20 0957 07/05/20 1137 08/15/20 1020  NA 142  --   --  K 4.4  --   --   CL 102  --   --   CO2 24  --   --   GLUCOSE 90  --   --   BUN 12  --   --   CREATININE 0.69  --   --   CALCIUM 10.1  --   --   GFRNONAA 87  --   --   GFRAA 100  --   --   PROT 7.1 7.1 8.1  ALBUMIN 4.4 4.4 4.3  AST 36 70* 37  ALT 33* 55* 31  ALKPHOS 87 82 72  BILITOT 0.3 0.5 0.5  BILIDIR  --  0.14 0.1  IBILI  --   --  0.4   Iron/TIBC/Ferritin/ %Sat    Component Value Date/Time   IRON 44 07/05/2020 1137   TIBC 315 07/05/2020 1137   FERRITIN 356 (H) 07/05/2020 1137   IRONPCTSAT 14 (L) 07/05/2020 1137      RADIOGRAPHIC STUDIES: I have personally reviewed the radiological images as listed and agreed with the findings in the report. No results found.    ASSESSMENT & PLAN:  1. Elevated ferritin   2. Transaminitis    Discussed with patient that elevation of ferritin can be secondary to acute/chronic inflammation, alcohol use, chronic liver disease, or secondary to iron overload due to hemochromatosis gene mutations. She uses alcohol chronically and I recommend patient to consider cut down the consumption of alcohol. I will check hemochromatosis gene mutation status.  Transaminitis, will repeat LFT today.  Orders Placed This Encounter  Procedures  . Hemochromatosis DNA-PCR(c282y,h63d)    Standing Status:   Future    Number of Occurrences:   1    Standing Expiration Date:    08/15/2021  . CBC with Differential/Platelet    Standing Status:   Future    Number of Occurrences:   1    Standing Expiration Date:   08/15/2021  . Hepatic function panel    Standing Status:   Future    Number of Occurrences:   1    Standing Expiration Date:   08/15/2021    All questions were answered. The patient knows to call the clinic with any problems questions or concerns.  cc Kendell Bane, NP    Return of visit: 2 weeks Thank you for this kind referral and the opportunity to participate in the care of this patient. A copy of today's note is routed to referring provider    Earlie Server, MD, PhD Hematology Oncology [facility] Pager- [phone] 08/15/2020

## 2020-08-19 ENCOUNTER — Telehealth: Payer: Self-pay

## 2020-08-19 LAB — HEMOCHROMATOSIS DNA-PCR(C282Y,H63D)

## 2020-08-19 NOTE — Telephone Encounter (Signed)
Confirmed appt for 08/23/20

## 2020-08-23 ENCOUNTER — Ambulatory Visit (INDEPENDENT_AMBULATORY_CARE_PROVIDER_SITE_OTHER): Payer: Medicare HMO | Admitting: Nurse Practitioner

## 2020-08-23 ENCOUNTER — Other Ambulatory Visit: Payer: Self-pay

## 2020-08-23 ENCOUNTER — Encounter: Payer: Self-pay | Admitting: Nurse Practitioner

## 2020-08-23 VITALS — BP 152/76 | HR 63 | Temp 97.4°F | Resp 16 | Ht <= 58 in | Wt 138.0 lb

## 2020-08-23 DIAGNOSIS — R300 Dysuria: Secondary | ICD-10-CM | POA: Diagnosis not present

## 2020-08-23 DIAGNOSIS — Z0001 Encounter for general adult medical examination with abnormal findings: Secondary | ICD-10-CM | POA: Diagnosis not present

## 2020-08-23 DIAGNOSIS — Z23 Encounter for immunization: Secondary | ICD-10-CM

## 2020-08-23 DIAGNOSIS — I10 Essential (primary) hypertension: Secondary | ICD-10-CM | POA: Diagnosis not present

## 2020-08-23 MED ORDER — PNEUMOVAX 23 25 MCG/0.5ML IJ INJ: INJECTION | INTRAMUSCULAR | 0 refills | Status: DC

## 2020-08-23 NOTE — Progress Notes (Signed)
[facility] [place], [place] [address]  Internal MEDICINE  Office Visit Note  Patient Name: Cynthia Keith  [id number]  [id number]  Date of Service: 09/04/2020   Pt is here for routine health maintenance examination  Chief Complaint  Patient presents with  . Medicare Wellness  . Gastroesophageal Reflux  . Hyperlipidemia  . Hypertension     The patient is here for health maintenance exam. She has no concerns or complaints. She has seen hematology since she was last seen. Her labs for hemachromatosis, hepatic functions, and follow up CBC came back normal. The patient is scheduled for abdominal ultrasound in10/2021. She will see hematology for follow up later that month. She is scheduled to have her screening mammogram on 09/05/2020. The patient states that she has no new concerns or complaints today.     Current Medication: Outpatient Encounter Medications as of 08/23/2020  Medication Sig  . aspirin EC 81 MG tablet Take 81 mg by mouth daily.  . diazepam (VALIUM) 5 MG tablet SMARTSIG:1 Tablet(s) Vaginal Every 12 Hours PRN  . Multiple Vitamin (MULTIVITAMIN) tablet Take 1 tablet by mouth daily.  Marland Kitchen tobramycin-dexamethasone (TOBRADEX) ophthalmic solution INSTILL 1 DROP INTO LEFT EYE 3 TIMES A DAY FOR 7 DAYS THEN ONCE DAILY FOR 7 MORE DAYS  . [DISCONTINUED] amLODipine (NORVASC) 10 MG tablet Take 1 tablet (10 mg total) by mouth daily.  . [DISCONTINUED] azithromycin (ZITHROMAX) 250 MG tablet z-pack - take as directed for 5 days  . pneumococcal 23 valent vaccine (PNEUMOVAX 23) 25 MCG/0.5ML injection Inject 0.28ml IM once   No facility-administered encounter medications on file as of 08/23/2020.    Surgical History: Past Surgical History:  Procedure Laterality Date  . ABDOMINAL HYSTERECTOMY    . COLONOSCOPY WITH PROPOFOL N/A 12/12/2015   Procedure: COLONOSCOPY WITH PROPOFOL;  Surgeon: Manya Silvas, MD;  Location: [facility] ENDOSCOPY;  Service: Endoscopy;   Laterality: N/A;  . HAND SURGERY  2005    Medical History: Past Medical History:  Diagnosis Date  . GERD (gastroesophageal reflux disease)   . Hyperlipidemia   . Hypertension   . Plantar fasciitis   . Sleep apnea     Family History: Family History  Problem Relation Age of Onset  . Breast cancer Cousin   . Stroke Mother   . Heart disease Paternal Grandmother   . Cancer Paternal Grandfather   . Stroke Sister   . Stroke Brother       Review of Systems  Constitutional: Negative for activity change, chills, fatigue and unexpected weight change.  HENT: Negative for congestion, postnasal drip, rhinorrhea, sneezing and sore throat.   Respiratory: Negative for cough, chest tightness, shortness of breath and wheezing.   Cardiovascular: Negative for chest pain and palpitations.  Gastrointestinal: Negative for abdominal pain, constipation, diarrhea, nausea and vomiting.  Endocrine: Negative for cold intolerance, heat intolerance, polydipsia and polyuria.  Genitourinary: Negative for dysuria, frequency and urgency.  Musculoskeletal: Negative for arthralgias, back pain, joint swelling and neck pain.  Skin: Negative for rash.  Allergic/Immunologic: Positive for environmental allergies.  Neurological: Negative for tremors, numbness and headaches.  Hematological: Negative for adenopathy. Does not bruise/bleed easily.  Psychiatric/Behavioral: Negative for behavioral problems (Depression), sleep disturbance and suicidal ideas. The patient is not nervous/anxious.      Today's Vitals   08/23/20 0939  BP: (!) 152/76  Pulse: 63  Resp: 16  Temp: (!) 97.4 F (36.3 C)  SpO2: 97%  Weight: 138 lb (62.6 kg)  Height: 4'  8" (1.422 m)   Body mass index is 30.94 kg/m.  Physical Exam Vitals and nursing note reviewed.  Constitutional:      General: She is not in acute distress.    Appearance: Normal appearance. She is well-developed. She is not diaphoretic.  HENT:     Head: Normocephalic  and atraumatic.     Nose: Nose normal.     Mouth/Throat:     Pharynx: No oropharyngeal exudate.  Eyes:     Pupils: Pupils are equal, round, and reactive to light.  Neck:     Thyroid: No thyromegaly.     Vascular: No carotid bruit or JVD.     Trachea: No tracheal deviation.  Cardiovascular:     Rate and Rhythm: Normal rate and regular rhythm.     Pulses: Normal pulses.     Heart sounds: Normal heart sounds. No murmur heard.  No friction rub. No gallop.   Pulmonary:     Effort: Pulmonary effort is normal. No respiratory distress.     Breath sounds: Normal breath sounds. No wheezing or rales.  Chest:     Chest wall: No tenderness.     Breasts:        Right: Normal. No swelling, bleeding, inverted nipple, mass, nipple discharge, skin change or tenderness.        Left: Normal. No swelling, bleeding, inverted nipple, mass, nipple discharge, skin change or tenderness.  Abdominal:     General: Bowel sounds are normal.     Palpations: Abdomen is soft.     Tenderness: There is no abdominal tenderness.  Musculoskeletal:        General: Normal range of motion.     Cervical back: Normal range of motion and neck supple.  Lymphadenopathy:     Cervical: No cervical adenopathy.     Upper Body:     Right upper body: No axillary adenopathy.     Left upper body: No axillary adenopathy.  Skin:    General: Skin is warm and dry.  Neurological:     General: No focal deficit present.     Mental Status: She is alert and oriented to person, place, and time.     Cranial Nerves: No cranial nerve deficit.  Psychiatric:        Mood and Affect: Mood normal.        Behavior: Behavior normal.        Thought Content: Thought content normal.        Judgment: Judgment normal.    Depression screen [facility] 2/9 08/23/2020 08/08/2020 02/08/2020 01/12/2020 11/09/2019  Decreased Interest 0 0 0 0 0  Down, Depressed, Hopeless 0 0 0 0 0  PHQ - 2 Score 0 0 0 0 0    Functional Status Survey: Is the patient deaf or  have difficulty hearing?: Yes Does the patient have difficulty seeing, even when wearing glasses/contacts?: Yes Does the patient have difficulty concentrating, remembering, or making decisions?: No Does the patient have difficulty walking or climbing stairs?: No Does the patient have difficulty dressing or bathing?: No Does the patient have difficulty doing errands alone such as visiting a doctor's office or shopping?: No  MMSE - [place] Exam 08/23/2020 07/06/2019 11/19/2018  Orientation to time 5 5 5   Orientation to Place 5 5 5   Registration 3 3 3   Attention/ Calculation 5 5 5   Recall 3 3 3   Language- name 2 objects 2 2 2   Language- repeat 1 1 1   Language- follow 3  step command 3 3 3   Language- read & follow direction 1 1 1   Write a sentence 1 1 1   Copy design 1 1 1   Total score 30 30 30     Fall Risk  08/23/2020 08/08/2020 02/08/2020 01/12/2020 11/09/2019  Falls in the past year? 0 0 0 0 0      LABS: Recent Results (from the past 2160 hour(s))  CBC     Status: None   Collection Time: 07/05/20 11:37 AM  Result Value Ref Range   WBC 4.8 3.4 - 10.8 x10E3/uL   RBC 4.42 3.77 - 5.28 x10E6/uL   Hemoglobin 13.8 11.1 - 15.9 g/dL   Hematocrit 40.0 34.0 - 46.6 %   MCV 91 79 - 97 fL   MCH 31.2 26.6 - 33.0 pg   MCHC 34.5 31 - 35 g/dL   RDW 12.5 11.7 - 15.4 %   Platelets 165 150 - 450 x10E3/uL  Hepatic function panel     Status: Abnormal   Collection Time: 07/05/20 11:37 AM  Result Value Ref Range   Total Protein 7.1 6.0 - 8.5 g/dL   Albumin 4.4 3.7 - 4.7 g/dL   Bilirubin Total 0.5 0.0 - 1.2 mg/dL   Bilirubin, Direct 0.14 0.00 - 0.40 mg/dL   Alkaline Phosphatase 82 48 - 121 IU/L   AST 70 (H) 0 - 40 IU/L   ALT 55 (H) 0 - 32 IU/L  Iron and TIBC     Status: Abnormal   Collection Time: 07/05/20 11:37 AM  Result Value Ref Range   Total Iron Binding Capacity 315 250 - 450 ug/dL   UIBC 271 118 - 369 ug/dL   Iron 44 27 - 139 ug/dL   Iron Saturation 14 (L) 15 - 55 %  B12 and  Folate Panel     Status: Abnormal   Collection Time: 07/05/20 11:37 AM  Result Value Ref Range   Vitamin B-12 1,340 (H) 232 - 1,245 pg/mL   Folate >20.0 >3.0 ng/mL    Comment: A serum folate concentration of less than 3.1 ng/mL is considered to represent clinical deficiency.   T4, free     Status: None   Collection Time: 07/05/20 11:37 AM  Result Value Ref Range   Free T4 0.97 0.82 - 1.77 ng/dL  TSH     Status: None   Collection Time: 07/05/20 11:37 AM  Result Value Ref Range   TSH 1.590 0.450 - 4.500 uIU/mL  Phosphorus     Status: None   Collection Time: 07/05/20 11:37 AM  Result Value Ref Range   Phosphorus 3.2 3.0 - 4.3 mg/dL  Magnesium     Status: None   Collection Time: 07/05/20 11:37 AM  Result Value Ref Range   Magnesium 2.2 1.6 - 2.3 mg/dL  Ferritin     Status: Abnormal   Collection Time: 07/05/20 11:37 AM  Result Value Ref Range   Ferritin 356 (H) 15.0 - 150.0 ng/mL  Hepatic function panel     Status: None   Collection Time: 08/15/20 10:20 AM  Result Value Ref Range   Total Protein 8.1 6.5 - 8.1 g/dL   Albumin 4.3 3.5 - 5.0 g/dL   AST 37 15 - 41 U/L   ALT 31 0 - 44 U/L   Alkaline Phosphatase 72 38 - 126 U/L   Total Bilirubin 0.5 0.3 - 1.2 mg/dL   Bilirubin, Direct 0.1 0.0 - 0.2 mg/dL   Indirect Bilirubin 0.4 0.3 - 0.9 mg/dL  Comment: Performed at [facility], [address] [place], [place] [address]  CBC with Differential/Platelet     Status: None   Collection Time: 08/15/20 10:20 AM  Result Value Ref Range   WBC 7.7 4.0 - 10.5 K/uL   RBC 4.67 3.87 - 5.11 MIL/uL   Hemoglobin 14.7 12.0 - 15.0 g/dL   HCT 42.2 36 - 46 %   MCV 90.4 80.0 - 100.0 fL   MCH 31.5 26.0 - 34.0 pg   MCHC 34.8 30.0 - 36.0 g/dL   RDW 12.7 11.5 - 15.5 %   Platelets 257 150 - 400 K/uL   nRBC 0.0 0.0 - 0.2 %   Neutrophils Relative % 52 %   Neutro Abs 4.1 1.7 - 7.7 K/uL   Lymphocytes Relative 32 %   Lymphs Abs 2.5 0.7 - 4.0 K/uL   Monocytes Relative 11 %   Monocytes  Absolute 0.8 0 - 1 K/uL   Eosinophils Relative 4 %   Eosinophils Absolute 0.3 0 - 0 K/uL   Basophils Relative 1 %   Basophils Absolute 0.1 0 - 0 K/uL   Immature Granulocytes 0 %   Abs Immature Granulocytes 0.03 0.00 - 0.07 K/uL    Comment: Performed at [facility], [address] [place], [place] [address]  Hemochromatosis DNA-PCR(c282y,h63d)     Status: None   Collection Time: 08/15/20 10:20 AM  Result Value Ref Range   DNA Mutation Analysis Comment     Comment: (NOTE) Result:  C282Y/H63D Two mutations (C282Y and H63D) identified Interpretation: This patient's sample was analyzed for the hereditary hemochromatosis (HH) mutations C282Y, H63D, and S65C.  One copy of C282Y and one copy of H63D were identified.  Results for S65C were negative.  The mutations analyzed by [facility] are most common in the Caucasian population.  Although some patients with this genotype experience biochemically defined abnormalities of iron overload, the penetrance for clinical symptoms, such as cirrhosis, cardiomyopathy, diabetes and arthropathy, is low.  The diagnosis of HH should not rely on DNA testing alone.  Diagnosis of HH should include clinical findings and other test results, such as transferrin-iron saturation and/or serum ferritin studies and/or liver biopsy.  HH is inherited in a recessive manner.  All the offspring from this patient will be carriers, and other family members are at increased risk.  Genetic counseling and HH molecular testi ng are recommended for at-risk family members. Methodology: DNA Analysis of the HFE gene was performed by PCR amplification followed by restriction enzyme digestion analyses. Reference: Cathe Mons and Walker AP. (2000). Genet Test 4:97-101. Cogswell ME et al. (1999). AM J Prev Med 16:134-140. Bomford A (2002). Lancet 360(9346):1673-81. Stan Head et al. (2002). Blood Cells, Molecules. and Diseases.  29(3):418-432. Imperatore G et al. (2003).  Genet Med. 5(1):1-8. Cogswell ME et al. (2003). Genet Med. 5(4):304-10. This test was developed and its performance characteristics determined by [facility]. It has not been cleared or approved by the Food and Drug Administration. Genetic counselors are available for health care providers to discuss results at 1-800-345-GENE. Allison Quarry, PhD, [facility] Ruben Reason, PhD, [facility] Ileene Hutchinson, PhD, [facility] Alfredo Bach, PhD, [facility] Norva Riffle, PhD, [facility] Earlean Polka, PhD, [facility] Performed At: [facility] [address] [place], [place] [id number] Katina Degree MDPhD KN:[id number]   UA/M w/rflx Culture, Routine     Status: Abnormal   Collection Time: 08/23/20  9:59 AM   Specimen: Urine   Urine  Result Value Ref  Range   Specific Gravity, UA 1.007 1.005 - 1.030   pH, UA 8.0 (H) 5.0 - 7.5   Color, UA Yellow Yellow   Appearance Ur Clear Clear   Leukocytes,UA Negative Negative   Protein,UA Negative Negative/Trace   Glucose, UA Negative Negative   Ketones, UA Negative Negative   RBC, UA Negative Negative   Bilirubin, UA Negative Negative   Urobilinogen, Ur 0.2 0.2 - 1.0 mg/dL   Nitrite, UA Negative Negative   Microscopic Examination Comment     Comment: Microscopic follows if indicated.   Microscopic Examination See below:     Comment: Microscopic was indicated and was performed.   Urinalysis Reflex Comment     Comment: This specimen will not reflex to a Urine Culture.  Microscopic Examination     Status: None   Collection Time: 08/23/20  9:59 AM   Urine  Result Value Ref Range   WBC, UA None seen 0 - 5 /hpf   RBC None seen 0 - 2 /hpf   Epithelial Cells (non renal) None seen 0 - 10 /hpf   Casts None seen None seen /lpf   Bacteria, UA None seen None seen/Few    Assessment/Plan: 1. Encounter for general adult medical examination with abnormal findings Annual health maintenance exam today.   2. Essential hypertension Stable. Continue bp medication as  prescribed   3. Hemochromatosis, unspecified hemochromatosis type Most recent labs return to normal levels. Patient to follow up with hematology as schedule.d   4. Need for vaccination against Streptococcus pneumoniae using pneumococcal conjugate vaccine 13 Prescription for pneumovax sent to her pharmacy for administration.  - pneumococcal 23 valent vaccine (PNEUMOVAX 23) 25 MCG/0.5ML injection; Inject 0.60ml IM once  Dispense: 0.5 mL; Refill: 0  5. Dysuria - UA/M w/rflx Culture, Routine  General Counseling: mairi stagliano understanding of the findings of todays visit and agrees with plan of treatment. I have discussed any further diagnostic evaluation that may be needed or ordered today. We also reviewed her medications today. she has been encouraged to call the office with any questions or concerns that should arise related to todays visit.    Counseling:  This patient was seen by Leretha Pol FNP Collaboration with Dr Lavera Guise as a part of collaborative care agreement  Orders Placed This Encounter  Procedures  . Microscopic Examination  . UA/M w/rflx Culture, Routine    Meds ordered this encounter  Medications  . pneumococcal 23 valent vaccine (PNEUMOVAX 23) 25 MCG/0.5ML injection    Sig: Inject 0.96ml IM once    Dispense:  0.5 mL    Refill:  0    Order Specific Question:   Supervising Provider    Answer:   Lavera Guise [[id number]]    Total time spent: 29 Minutes  Time spent includes review of chart, medications, test results, and follow up plan with the patient.     Lavera Guise, MD  Internal Medicine

## 2020-08-24 LAB — UA/M W/RFLX CULTURE, ROUTINE
Bilirubin, UA: NEGATIVE
Glucose, UA: NEGATIVE
Ketones, UA: NEGATIVE
Leukocytes,UA: NEGATIVE
Nitrite, UA: NEGATIVE
Protein,UA: NEGATIVE
RBC, UA: NEGATIVE
Specific Gravity, UA: 1.007 (ref 1.005–1.030)
Urobilinogen, Ur: 0.2 mg/dL (ref 0.2–1.0)
pH, UA: 8 — ABNORMAL HIGH (ref 5.0–7.5)

## 2020-08-24 LAB — MICROSCOPIC EXAMINATION
Bacteria, UA: NONE SEEN
Casts: NONE SEEN /lpf
Epithelial Cells (non renal): NONE SEEN /hpf (ref 0–10)
RBC, Urine: NONE SEEN /hpf (ref 0–2)
WBC, UA: NONE SEEN /hpf (ref 0–5)

## 2020-08-29 DIAGNOSIS — R69 Illness, unspecified: Secondary | ICD-10-CM | POA: Diagnosis not present

## 2020-08-31 ENCOUNTER — Other Ambulatory Visit: Payer: Self-pay | Admitting: Adult Health

## 2020-08-31 DIAGNOSIS — I10 Essential (primary) hypertension: Secondary | ICD-10-CM

## 2020-09-02 ENCOUNTER — Other Ambulatory Visit: Payer: Medicare HMO

## 2020-09-04 DIAGNOSIS — Z0001 Encounter for general adult medical examination with abnormal findings: Secondary | ICD-10-CM | POA: Insufficient documentation

## 2020-09-05 ENCOUNTER — Inpatient Hospital Stay: Payer: Medicare HMO | Attending: Oncology | Admitting: Oncology

## 2020-09-05 ENCOUNTER — Ambulatory Visit
Admission: RE | Admit: 2020-09-05 | Discharge: 2020-09-05 | Disposition: A | Discharge status: Home / Self Care | Payer: Medicare HMO | Source: Ambulatory Visit | Attending: Nurse Practitioner | Admitting: Nurse Practitioner

## 2020-09-05 ENCOUNTER — Encounter: Payer: Self-pay | Admitting: Oncology

## 2020-09-05 ENCOUNTER — Other Ambulatory Visit: Payer: Self-pay

## 2020-09-05 ENCOUNTER — Ambulatory Visit: Payer: Medicare HMO | Admitting: Adult Health

## 2020-09-05 VITALS — BP 147/75 | HR 64 | Temp 97.4°F | Resp 18 | Wt 138.7 lb

## 2020-09-05 DIAGNOSIS — R7989 Other specified abnormal findings of blood chemistry: Secondary | ICD-10-CM | POA: Diagnosis not present

## 2020-09-05 DIAGNOSIS — Z1231 Encounter for screening mammogram for malignant neoplasm of breast: Secondary | ICD-10-CM

## 2020-09-05 NOTE — Progress Notes (Signed)
Hematology/Oncology Consult note [facility] [phone] [phone]   Patient Care Team: Lavera Guise, MD as PCP - General (Internal Medicine)  REFERRING PROVIDER: Lavera Guise, MD  CHIEF COMPLAINTS/REASON FOR VISIT:  Evaluation of elevated ferritin  HISTORY OF PRESENTING ILLNESS:   Cynthia Keith is a  74 y.o.  female with PMH listed below was seen in consultation at the request of  Lavera Guise, MD  for evaluation of elevated ferritin 07/05/2020, patient had blood work done which showed decreased iron saturation 14, ferritin level was increased at 356, TIBC 315.  Patient was referred to hematology for further evaluation for possible hemochromatosis/elevated ferritin. Patient reports feeling well at baseline today. Patient drinks red wine 4-5 days per week.  Occasionally she also drinks beer when she orders pizza. Denies any recent infection, new medication. Denies any family history of hemochromatosis.   INTERVAL HISTORY Cynthia Keith is a 74 y.o. female who has above history reviewed by me today presents for follow up visit for  Elevated ferritin. Problems and complaints are listed below: Patient has had blood work done during the interval.  Presents to discuss results.  She has no new complaints. Review of Systems  Constitutional: Negative for appetite change, chills, fatigue and fever.  HENT:   Negative for hearing loss and voice change.   Eyes: Negative for eye problems.  Respiratory: Negative for chest tightness and cough.   Cardiovascular: Negative for chest pain.  Gastrointestinal: Negative for abdominal distention, abdominal pain and blood in stool.  Endocrine: Negative for hot flashes.  Genitourinary: Negative for difficulty urinating and frequency.   Musculoskeletal: Negative for arthralgias.  Skin: Negative for itching and rash.  Neurological: Negative for extremity weakness.  Hematological: Negative for adenopathy.   Psychiatric/Behavioral: Negative for confusion.    MEDICAL HISTORY:  Past Medical History:  Diagnosis Date  . GERD (gastroesophageal reflux disease)   . Hyperlipidemia   . Hypertension   . Plantar fasciitis   . Sleep apnea     SURGICAL HISTORY: Past Surgical History:  Procedure Laterality Date  . ABDOMINAL HYSTERECTOMY    . COLONOSCOPY WITH PROPOFOL N/A 12/12/2015   Procedure: COLONOSCOPY WITH PROPOFOL;  Surgeon: Manya Silvas, MD;  Location[facility] ENDOSCOPY;  Service: Endoscopy;  Laterality: N/A;  . HAND SURGERY  2005    SOCIAL HISTORY: Social History   Socioeconomic History  . Marital status: Married    Spouse name: Not on file  . Number of children: Not on file  . Years of education: Not on file  . Highest education level: Not on file  Occupational History  . Not on file  Tobacco Use  . Smoking status: Never Smoker  . Smokeless tobacco: Never Used  Vaping Use  . Vaping Use: Never used  Substance and Sexual Activity  . Alcohol use: Yes    Comment: ocassionally  . Drug use: No  . Sexual activity: Yes    Birth control/protection: Post-menopausal, Surgical  Other Topics Concern  . Not on file  Social History Narrative  . Not on file   Social Determinants of Health   Financial Resource Strain:   . Difficulty of Paying Living Expenses: Not on file  Food Insecurity:   . Worried About Charity fundraiser in the Last Year: Not on file  . Ran Out of Food in the Last Year: Not on file  Transportation Needs:   . Lack of Transportation (Medical): Not on file  . Lack  of Transportation (Non-Medical): Not on file  Physical Activity:   . Days of Exercise per Week: Not on file  . Minutes of Exercise per Session: Not on file  Stress:   . Feeling of Stress : Not on file  Social Connections:   . Frequency of Communication with Friends and Family: Not on file  . Frequency of Social Gatherings with Friends and Family: Not on file  . Attends Religious Services: Not  on file  . Active Member of Clubs or Organizations: Not on file  . Attends Archivist Meetings: Not on file  . Marital Status: Not on file  Intimate Partner Violence:   . Fear of Current or Ex-Partner: Not on file  . Emotionally Abused: Not on file  . Physically Abused: Not on file  . Sexually Abused: Not on file    FAMILY HISTORY: Family History  Problem Relation Age of Onset  . Breast cancer Cousin   . Stroke Mother   . Heart disease Paternal Grandmother   . Cancer Paternal Grandfather   . Stroke Sister   . Stroke Brother     ALLERGIES:  is allergic to iodinated diagnostic agents and iodine.  MEDICATIONS:  Current Outpatient Medications  Medication Sig Dispense Refill  . amLODipine (NORVASC) 10 MG tablet TAKE 1 TABLET BY MOUTH EVERY DAY 90 tablet 1  . aspirin EC 81 MG tablet Take 81 mg by mouth daily.    . Multiple Vitamin (MULTIVITAMIN) tablet Take 1 tablet by mouth daily.    Marland Kitchen omeprazole (PRILOSEC) 20 MG capsule Take 20 mg by mouth 2 (two) times daily.    Marland Kitchen tobramycin-dexamethasone (TOBRADEX) ophthalmic solution INSTILL 1 DROP INTO LEFT EYE 3 TIMES A DAY FOR 7 DAYS THEN ONCE DAILY FOR 7 MORE DAYS    . diazepam (VALIUM) 5 MG tablet SMARTSIG:1 Tablet(s) Vaginal Every 12 Hours PRN (Patient not taking: Reported on 09/05/2020)    . pneumococcal 23 valent vaccine (PNEUMOVAX 23) 25 MCG/0.5ML injection Inject 0.14ml IM once (Patient not taking: Reported on 09/05/2020) 0.5 mL 0   No current facility-administered medications for this visit.     PHYSICAL EXAMINATION: ECOG PERFORMANCE STATUS: 0 - Asymptomatic Vitals:   09/05/20 1357  BP: (!) 147/75  Pulse: 64  Resp: 18  Temp: (!) 97.4 F (36.3 C)   Filed Weights   09/05/20 1357  Weight: 138 lb 11.2 oz (62.9 kg)    Physical Exam Constitutional:      General: She is not in acute distress. HENT:     Head: Normocephalic and atraumatic.  Eyes:     General: No scleral icterus. Cardiovascular:     Rate and  Rhythm: Normal rate and regular rhythm.     Heart sounds: Normal heart sounds.  Pulmonary:     Effort: Pulmonary effort is normal. No respiratory distress.     Breath sounds: No wheezing.  Abdominal:     General: Bowel sounds are normal. There is no distension.     Palpations: Abdomen is soft.  Musculoskeletal:        General: No deformity. Normal range of motion.     Cervical back: Normal range of motion and neck supple.  Skin:    General: Skin is warm and dry.     Findings: No erythema or rash.  Neurological:     Mental Status: She is alert and oriented to person, place, and time. Mental status is at baseline.     Cranial Nerves: No cranial nerve deficit.  Coordination: Coordination normal.  Psychiatric:        Mood and Affect: Mood normal.     LABORATORY DATA:  I have reviewed the data as listed Lab Results  Component Value Date   WBC 7.7 08/15/2020   HGB 14.7 08/15/2020   HCT 42.2 08/15/2020   MCV 90.4 08/15/2020   PLT 257 08/15/2020   Recent Labs    05/09/20 0957 07/05/20 1137 08/15/20 1020  NA 142  --   --   K 4.4  --   --   CL 102  --   --   CO2 24  --   --   GLUCOSE 90  --   --   BUN 12  --   --   CREATININE 0.69  --   --   CALCIUM 10.1  --   --   GFRNONAA 87  --   --   GFRAA 100  --   --   PROT 7.1 7.1 8.1  ALBUMIN 4.4 4.4 4.3  AST 36 70* 37  ALT 33* 55* 31  ALKPHOS 87 82 72  BILITOT 0.3 0.5 0.5  BILIDIR  --  0.14 0.1  IBILI  --   --  0.4   Iron/TIBC/Ferritin/ %Sat    Component Value Date/Time   IRON 44 07/05/2020 1137   TIBC 315 07/05/2020 1137   FERRITIN 356 (H) 07/05/2020 1137   IRONPCTSAT 14 (L) 07/05/2020 1137      RADIOGRAPHIC STUDIES: I have personally reviewed the radiological images as listed and agreed with the findings in the report. No results found.    ASSESSMENT & PLAN:  1. Elevated ferritin   2. 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 Patient carries 2 mutations CY282Y and H63D Ferritin is less than 500. Transaminitis has resolved and normalized. Diagnosis of Khary of hemochromatosis compound mutation  discussed with patient.  Advised patient to have first-degree relative screening for hemochromatosis.  Avoid alcohol consumption.  Avoid uncooked seafood. Patient voices understanding.  I will obtain US right upper quadrant for further evaluation of her liver.  Orders Placed This Encounter  Procedures  . US Abdomen Limited RUQ    Standing Status:   Future    Standing Expiration Date:   09/05/2021    Order Specific Question:   Reason for Exam (SYMPTOM  OR DIAGNOSIS REQUIRED)    Answer:   hemochromatosis    Order Specific Question:   Preferred imaging location?    Answer:   [facility]  . CBC with Differential/Platelet    Standing Status:   Future    Standing Expiration Date:   09/05/2021  . Ferritin    Standing Status:   Future    Standing Expiration Date:   09/05/2021  . Iron and TIBC    Standing Status:   Future    Standing Expiration Date:   09/05/2021    All questions were answered. The patient knows to call the clinic with any problems questions or concerns.  cc Lavera Guise, MD    Return of visit:  3 months.  Thank you for this kind referral and the opportunity to participate in the care of this patient. A copy of today's note is routed to referring provider    Earlie Server, MD, PhD Hematology Oncology [facility] Pager- [phone] 09/05/2020

## 2020-09-05 NOTE — Progress Notes (Signed)
Patient denies new problems/concerns today.   °

## 2020-09-06 ENCOUNTER — Other Ambulatory Visit: Payer: Self-pay

## 2020-09-06 ENCOUNTER — Other Ambulatory Visit: Payer: Self-pay | Admitting: Nurse Practitioner

## 2020-09-06 DIAGNOSIS — N6489 Other specified disorders of breast: Secondary | ICD-10-CM

## 2020-09-06 DIAGNOSIS — R928 Other abnormal and inconclusive findings on diagnostic imaging of breast: Secondary | ICD-10-CM

## 2020-09-06 MED ORDER — OMEPRAZOLE 20 MG PO CPDR: 20.0000 mg | DELAYED_RELEASE_CAPSULE | Freq: Two times a day (BID) | ORAL | 3 refills | Status: DC

## 2020-09-06 NOTE — Progress Notes (Signed)
Patient to be called back for additional images.

## 2020-09-07 ENCOUNTER — Other Ambulatory Visit: Payer: Self-pay

## 2020-09-07 ENCOUNTER — Ambulatory Visit
Admission: RE | Admit: 2020-09-07 | Discharge: 2020-09-07 | Disposition: A | Discharge status: Home / Self Care | Payer: Medicare HMO | Source: Ambulatory Visit | Attending: Oncology | Admitting: Oncology

## 2020-09-07 DIAGNOSIS — R7989 Other specified abnormal findings of blood chemistry: Secondary | ICD-10-CM | POA: Diagnosis not present

## 2020-09-19 ENCOUNTER — Ambulatory Visit
Admission: RE | Admit: 2020-09-19 | Discharge: 2020-09-19 | Disposition: A | Discharge status: Home / Self Care | Payer: Medicare HMO | Source: Ambulatory Visit | Attending: Nurse Practitioner | Admitting: Nurse Practitioner

## 2020-09-19 ENCOUNTER — Other Ambulatory Visit: Payer: Self-pay

## 2020-09-19 DIAGNOSIS — R928 Other abnormal and inconclusive findings on diagnostic imaging of breast: Secondary | ICD-10-CM | POA: Diagnosis not present

## 2020-09-19 DIAGNOSIS — N6011 Diffuse cystic mastopathy of right breast: Secondary | ICD-10-CM | POA: Diagnosis not present

## 2020-09-19 DIAGNOSIS — N6489 Other specified disorders of breast: Secondary | ICD-10-CM

## 2020-09-19 NOTE — Progress Notes (Signed)
Follow up closely

## 2020-09-19 NOTE — Progress Notes (Signed)
Monitor closely

## 2020-10-14 ENCOUNTER — Ambulatory Visit: Payer: Medicare HMO

## 2020-10-14 ENCOUNTER — Other Ambulatory Visit: Payer: Self-pay

## 2020-10-17 ENCOUNTER — Other Ambulatory Visit: Payer: Medicare HMO

## 2020-10-17 ENCOUNTER — Ambulatory Visit: Payer: Medicare HMO | Admitting: Adult Health

## 2020-10-17 ENCOUNTER — Encounter: Payer: Medicare HMO | Admitting: Dermatology

## 2020-10-17 DIAGNOSIS — Z20822 Contact with and (suspected) exposure to covid-19: Secondary | ICD-10-CM

## 2020-10-18 LAB — NOVEL CORONAVIRUS, NAA: SARS-CoV-2, NAA: NOT DETECTED

## 2020-10-18 LAB — SARS-COV-2, NAA 2 DAY TAT

## 2020-10-19 ENCOUNTER — Telehealth: Payer: Self-pay | Admitting: General Practice

## 2020-10-19 NOTE — Telephone Encounter (Signed)
Let Patient know her covid test result were negative

## 2020-10-24 DIAGNOSIS — R69 Illness, unspecified: Secondary | ICD-10-CM | POA: Diagnosis not present

## 2020-11-04 ENCOUNTER — Other Ambulatory Visit: Payer: Self-pay

## 2020-11-04 ENCOUNTER — Ambulatory Visit: Payer: Medicare HMO

## 2020-11-04 DIAGNOSIS — R748 Abnormal levels of other serum enzymes: Secondary | ICD-10-CM | POA: Diagnosis not present

## 2020-11-07 DIAGNOSIS — Z961 Presence of intraocular lens: Secondary | ICD-10-CM | POA: Diagnosis not present

## 2020-11-07 DIAGNOSIS — H18593 Other hereditary corneal dystrophies, bilateral: Secondary | ICD-10-CM | POA: Diagnosis not present

## 2020-11-15 ENCOUNTER — Encounter: Payer: Self-pay | Admitting: Hospice and Palliative Medicine

## 2020-11-15 ENCOUNTER — Other Ambulatory Visit: Payer: Self-pay

## 2020-11-15 ENCOUNTER — Ambulatory Visit (INDEPENDENT_AMBULATORY_CARE_PROVIDER_SITE_OTHER): Payer: Medicare HMO | Admitting: Hospice and Palliative Medicine

## 2020-11-15 DIAGNOSIS — R160 Hepatomegaly, not elsewhere classified: Secondary | ICD-10-CM | POA: Diagnosis not present

## 2020-11-15 DIAGNOSIS — I10 Essential (primary) hypertension: Secondary | ICD-10-CM

## 2020-11-15 NOTE — Progress Notes (Signed)
[facility] [address] [place], [place] [address]  Internal MEDICINE  Office Visit Note  Patient Name: Cynthia Keith  [id number]  [id number]  Date of Service: 11/20/2020  Chief Complaint  Patient presents with  . Follow-up    [place] review, right lower leg, sharp pain, x4 months, more frequent now at night, left upper leg pain as well  . Gastroesophageal Reflux  . Hypertension  . Hyperlipidemia  . Sleep Apnea  . policy update form    received    HPI Patient is here for routine follow-up Reviewed her most recent abdominal US: liver 13 cm--unchanged in size since 2019, more echogenic, consistent with fatty replacement, no lesions or dilated bile ducts, gallbladder slightly contracted, no calculi or wall thickening  Has been seen and evaluated by hematology due to elevated ferritin levels--diagnosed with hemochromatosis, genetic in origin, carries 2 mutations--advised to avoid alcohol consumption--Dr. Cathie Hoops awaiting results of abdominal US, will f/u with patient in 3 month   Current Medication: Outpatient Encounter Medications as of 11/15/2020  Medication Sig  . amLODipine (NORVASC) 10 MG tablet TAKE 1 TABLET BY MOUTH EVERY DAY  . aspirin EC 81 MG tablet Take 81 mg by mouth daily.  . Multiple Vitamin (MULTIVITAMIN) tablet Take 1 tablet by mouth daily.  Marland Kitchen tobramycin-dexamethasone (TOBRADEX) ophthalmic solution INSTILL 1 DROP INTO LEFT EYE 3 TIMES A DAY FOR 7 DAYS THEN ONCE DAILY FOR 7 MORE DAYS  . [DISCONTINUED] diazepam (VALIUM) 5 MG tablet SMARTSIG:1 Tablet(s) Vaginal Every 12 Hours PRN (Patient not taking: Reported on 09/05/2020)  . [DISCONTINUED] omeprazole (PRILOSEC) 20 MG capsule Take 1 capsule (20 mg total) by mouth 2 (two) times daily.  . [DISCONTINUED] pneumococcal 23 valent vaccine (PNEUMOVAX 23) 25 MCG/0.5ML injection Inject 0.109ml IM once (Patient not taking: Reported on 09/05/2020)   No facility-administered encounter medications on file as of 11/15/2020.     Surgical History: Past Surgical History:  Procedure Laterality Date  . ABDOMINAL HYSTERECTOMY    . COLONOSCOPY WITH PROPOFOL N/A 12/12/2015   Procedure: COLONOSCOPY WITH PROPOFOL;  Surgeon: Scot Jun, MD;  Location: [facility] ENDOSCOPY;  Service: Endoscopy;  Laterality: N/A;  . HAND SURGERY  2005    Medical History: Past Medical History:  Diagnosis Date  . GERD (gastroesophageal reflux disease)   . Hyperlipidemia   . Hypertension   . Plantar fasciitis   . Sleep apnea     Family History: Family History  Problem Relation Age of Onset  . Breast cancer Cousin   . Stroke Mother   . Heart disease Paternal Grandmother   . Cancer Paternal Grandfather   . Stroke Sister   . Stroke Brother     Social History   Socioeconomic History  . Marital status: Married    Spouse name: Not on file  . Number of children: Not on file  . Years of education: Not on file  . Highest education level: Not on file  Occupational History  . Not on file  Tobacco Use  . Smoking status: Never Smoker  . Smokeless tobacco: Never Used  Vaping Use  . Vaping Use: Never used  Substance and Sexual Activity  . Alcohol use: Yes    Comment: ocassionally  . Drug use: No  . Sexual activity: Yes    Birth control/protection: Post-menopausal, Surgical  Other Topics Concern  . Not on file  Social History Narrative  . Not on file   Social Determinants of Health   Financial Resource Strain:   .  Difficulty of Paying Living Expenses: Not on file  Food Insecurity:   . Worried About Programme researcher, broadcasting/film/video in the Last Year: Not on file  . Ran Out of Food in the Last Year: Not on file  Transportation Needs:   . Lack of Transportation (Medical): Not on file  . Lack of Transportation (Non-Medical): Not on file  Physical Activity:   . Days of Exercise per Week: Not on file  . Minutes of Exercise per Session: Not on file  Stress:   . Feeling of Stress : Not on file  Social Connections:   . Frequency of  Communication with Friends and Family: Not on file  . Frequency of Social Gatherings with Friends and Family: Not on file  . Attends Religious Services: Not on file  . Active Member of Clubs or Organizations: Not on file  . Attends Banker Meetings: Not on file  . Marital Status: Not on file  Intimate Partner Violence:   . Fear of Current or Ex-Partner: Not on file  . Emotionally Abused: Not on file  . Physically Abused: Not on file  . Sexually Abused: Not on file      Review of Systems  Constitutional: Negative for chills, diaphoresis and fatigue.  HENT: Negative for ear pain, postnasal drip and sinus pressure.   Eyes: Negative for photophobia, discharge, redness, itching and visual disturbance.  Respiratory: Negative for cough, shortness of breath and wheezing.   Cardiovascular: Negative for chest pain, palpitations and leg swelling.  Gastrointestinal: Negative for abdominal pain, constipation, diarrhea, nausea and vomiting.  Genitourinary: Negative for dysuria and flank pain.  Musculoskeletal: Negative for arthralgias, back pain, gait problem and neck pain.  Skin: Negative for color change.  Allergic/Immunologic: Negative for environmental allergies and food allergies.  Neurological: Negative for dizziness and headaches.  Hematological: Does not bruise/bleed easily.  Psychiatric/Behavioral: Negative for agitation, behavioral problems (depression) and hallucinations.    Vital Signs: BP 136/76   Pulse 64   Temp (!) 97.3 F (36.3 C)   Resp 16   Ht 4\' 8"  (1.422 m)   Wt 135 lb 9.6 oz (61.5 kg)   SpO2 99%   BMI 30.40 kg/m    Physical Exam Vitals reviewed.  Constitutional:      Appearance: Normal appearance. She is normal weight.  Cardiovascular:     Rate and Rhythm: Normal rate and regular rhythm.     Pulses: Normal pulses.     Heart sounds: Normal heart sounds.  Pulmonary:     Effort: Pulmonary effort is normal.     Breath sounds: Normal breath sounds.   Abdominal:     General: Abdomen is flat.     Palpations: Abdomen is soft.  Musculoskeletal:        General: Normal range of motion.     Cervical back: Normal range of motion.  Skin:    General: Skin is warm.  Neurological:     General: No focal deficit present.     Mental Status: She is alert and oriented to person, place, and time. Mental status is at baseline.  Psychiatric:        Mood and Affect: Mood normal.        Behavior: Behavior normal.        Thought Content: Thought content normal.        Judgment: Judgment normal.    Assessment/Plan: 1. Hemochromatosis, unspecified hemochromatosis type Followed and managed by hematology--continue with monitoring  2. Enlarged liver Closely monitor--await  recommendations of hematology Advised to continue avoiding alcohol  3. Essential hypertension BP and HR well controlled today on current therapy, continue with routine monitoring  General Counseling: britani durben understanding of the findings of todays visit and agrees with plan of treatment. I have discussed any further diagnostic evaluation that may be needed or ordered today. We also reviewed her medications today. she has been encouraged to call the office with any questions or concerns that should arise related to todays visit.  Time spent: 30 Minutes Time spent includes review of chart, medications, test results and follow-up plan with the patient.  This patient was seen by Leeanne Deed AGNP-C in Collaboration with Dr Lyndon Code as a part of collaborative care agreement     Lubertha Basque. Talyah Seder AGNP-C Internal medicine

## 2020-11-20 ENCOUNTER — Encounter: Payer: Self-pay | Admitting: Hospice and Palliative Medicine

## 2020-11-21 ENCOUNTER — Telehealth: Payer: Self-pay | Admitting: Oncology

## 2020-11-21 NOTE — Telephone Encounter (Signed)
Patient was not aware of the 12/13 appts.  She can't do that date due to taking her sister to another appt at [facility].  Please contact pt to r/s the 12/13 appt.

## 2020-11-21 NOTE — Telephone Encounter (Signed)
Pt presented to front desk requesting a follow up with Dr. Tasia Catchings at the direction of Dr. Humphrey Rolls.  I informed the patient that I would send the message to the team to determine if she needs to be seen sooner than her scheduled 12/13.

## 2020-11-23 NOTE — Telephone Encounter (Signed)
Done... Pts 12/05/20 lab/MD appts has been R/S to 12/27/19 per pts request. A NEW appt reminder letter will be mailed out.

## 2020-12-05 ENCOUNTER — Ambulatory Visit: Payer: Medicare HMO | Admitting: Oncology

## 2020-12-05 ENCOUNTER — Other Ambulatory Visit: Payer: Medicare HMO

## 2020-12-05 ENCOUNTER — Ambulatory Visit: Payer: Medicare HMO | Admitting: Internal Medicine

## 2020-12-18 DIAGNOSIS — R059 Cough, unspecified: Secondary | ICD-10-CM | POA: Diagnosis not present

## 2020-12-18 DIAGNOSIS — J069 Acute upper respiratory infection, unspecified: Secondary | ICD-10-CM | POA: Diagnosis not present

## 2020-12-18 DIAGNOSIS — J189 Pneumonia, unspecified organism: Secondary | ICD-10-CM | POA: Diagnosis not present

## 2020-12-19 ENCOUNTER — Telehealth: Payer: Self-pay

## 2020-12-19 ENCOUNTER — Other Ambulatory Visit: Payer: Self-pay | Admitting: Hospice and Palliative Medicine

## 2020-12-19 NOTE — Telephone Encounter (Signed)
Pt called and informed us that she was seen at urgent care [facility] and pt had xray done there.  The dr told pt she had a infection in the pockets of her lungs and gave her amoxicillin and a z-pak.  They also told her to fup with her PCP and pt did get that scheduled.  I informed pt to call us back on Thursday to let us know how she is doing in case we need to do anything different before her appt on Monday 12/26/20

## 2020-12-26 ENCOUNTER — Inpatient Hospital Stay: Payer: Medicare HMO | Attending: Oncology

## 2020-12-26 ENCOUNTER — Encounter: Payer: Self-pay | Admitting: Oncology

## 2020-12-26 ENCOUNTER — Inpatient Hospital Stay: Payer: Medicare HMO | Admitting: Oncology

## 2020-12-26 DIAGNOSIS — K760 Fatty (change of) liver, not elsewhere classified: Secondary | ICD-10-CM | POA: Insufficient documentation

## 2020-12-26 DIAGNOSIS — R7989 Other specified abnormal findings of blood chemistry: Secondary | ICD-10-CM

## 2020-12-26 LAB — CBC WITH DIFFERENTIAL/PLATELET
Abs Immature Granulocytes: 0.03 10*3/uL (ref 0.00–0.07)
Basophils Absolute: 0 10*3/uL (ref 0.0–0.1)
Basophils Relative: 1 %
Eosinophils Absolute: 0.3 10*3/uL (ref 0.0–0.5)
Eosinophils Relative: 4 %
HCT: 41.9 % (ref 36.0–46.0)
Hemoglobin: 14.8 g/dL (ref 12.0–15.0)
Immature Granulocytes: 0 %
Lymphocytes Relative: 38 %
Lymphs Abs: 2.9 10*3/uL (ref 0.7–4.0)
MCH: 31.7 pg (ref 26.0–34.0)
MCHC: 35.3 g/dL (ref 30.0–36.0)
MCV: 89.7 fL (ref 80.0–100.0)
Monocytes Absolute: 0.7 10*3/uL (ref 0.1–1.0)
Monocytes Relative: 10 %
Neutro Abs: 3.6 10*3/uL (ref 1.7–7.7)
Neutrophils Relative %: 47 %
Platelets: 259 10*3/uL (ref 150–400)
RBC: 4.67 MIL/uL (ref 3.87–5.11)
RDW: 12.8 % (ref 11.5–15.5)
WBC: 7.5 10*3/uL (ref 4.0–10.5)
nRBC: 0 % (ref 0.0–0.2)

## 2020-12-26 LAB — HEPATIC FUNCTION PANEL
ALT: 31 U/L (ref 0–44)
AST: 33 U/L (ref 15–41)
Albumin: 4.2 g/dL (ref 3.5–5.0)
Alkaline Phosphatase: 73 U/L (ref 38–126)
Bilirubin, Direct: <0.1 mg/dL (ref 0.0–0.2)
Total Bilirubin: 0.4 mg/dL (ref 0.3–1.2)
Total Protein: 7.8 g/dL (ref 6.5–8.1)

## 2020-12-26 LAB — FERRITIN: Ferritin: 76 ng/mL (ref 11–307)

## 2020-12-26 LAB — IRON AND TIBC
Iron: 103 ug/dL (ref 28–170)
Saturation Ratios: 27 % (ref 10.4–31.8)
TIBC: 381 ug/dL (ref 250–450)
UIBC: 278 ug/dL

## 2020-12-26 NOTE — Progress Notes (Signed)
Hematology/Oncology Consult note [facility] Telephone:([phone] [phone]   Patient Care Team: Lavera Guise, MD as PCP - General (Internal Medicine)  REFERRING PROVIDER: Lavera Guise, MD  CHIEF COMPLAINTS/REASON FOR VISIT:  Evaluation of elevated ferritin  HISTORY OF PRESENTING ILLNESS:   Cynthia Keith is a  75 y.o.  female with PMH listed below was seen in consultation at the request of  Lavera Guise, MD  for evaluation of elevated ferritin 07/05/2020, patient had blood work done which showed decreased iron saturation 14, ferritin level was increased at 356, TIBC 315.  Patient was referred to hematology for further evaluation for possible hemochromatosis/elevated ferritin. Patient reports feeling well at baseline today. Patient drinks red wine 4-5 days per week.  Occasionally she also drinks beer when she orders pizza. Denies any recent infection, new medication. Denies any family history of hemochromatosis.   INTERVAL HISTORY Cynthia Keith is a 75 y.o. female who has above history reviewed by me today presents for follow up visit for compound heterozygous hemochromatosis mutation Problems and complaints are listed below: Patient has no new complaints. She has had ultrasound abdomen done during interval.  Results were scanned to epic. Review of Systems  Constitutional: Negative for appetite change, chills, fatigue and fever.  HENT:   Negative for hearing loss and voice change.   Eyes: Negative for eye problems.  Respiratory: Negative for chest tightness and cough.   Cardiovascular: Negative for chest pain.  Gastrointestinal: Negative for abdominal distention, abdominal pain and blood in stool.  Endocrine: Negative for hot flashes.  Genitourinary: Negative for difficulty urinating and frequency.   Musculoskeletal: Negative for arthralgias.  Skin: Negative for itching and rash.  Neurological: Negative for extremity weakness.   Hematological: Negative for adenopathy.  Psychiatric/Behavioral: Negative for confusion.    MEDICAL HISTORY:  Past Medical History:  Diagnosis Date  . GERD (gastroesophageal reflux disease)   . Hyperlipidemia   . Hypertension   . Plantar fasciitis   . Sleep apnea     SURGICAL HISTORY: Past Surgical History:  Procedure Laterality Date  . ABDOMINAL HYSTERECTOMY    . COLONOSCOPY WITH PROPOFOL N/A 12/12/2015   Procedure: COLONOSCOPY WITH PROPOFOL;  Surgeon: Manya Silvas, MD;  Location: [facility] ENDOSCOPY;  Service: Endoscopy;  Laterality: N/A;  . HAND SURGERY  2005    SOCIAL HISTORY: Social History   Socioeconomic History  . Marital status: Married    Spouse name: Not on file  . Number of children: Not on file  . Years of education: Not on file  . Highest education level: Not on file  Occupational History  . Not on file  Tobacco Use  . Smoking status: Never Smoker  . Smokeless tobacco: Never Used  Vaping Use  . Vaping Use: Never used  Substance and Sexual Activity  . Alcohol use: Yes    Comment: ocassionally  . Drug use: No  . Sexual activity: Yes    Birth control/protection: Post-menopausal, Surgical  Other Topics Concern  . Not on file  Social History Narrative  . Not on file   Social Determinants of Health   Financial Resource Strain: Not on file  Food Insecurity: Not on file  Transportation Needs: Not on file  Physical Activity: Not on file  Stress: Not on file  Social Connections: Not on file  Intimate Partner Violence: Not on file    FAMILY HISTORY: Family History  Problem Relation Age of Onset  . Breast cancer Cousin   .  Stroke Mother   . Heart disease Paternal Grandmother   . Cancer Paternal Grandfather   . Stroke Sister   . Stroke Brother     ALLERGIES:  is allergic to iodinated diagnostic agents and iodine.  MEDICATIONS:  Current Outpatient Medications  Medication Sig Dispense Refill  . amLODipine (NORVASC) 10 MG tablet TAKE 1  TABLET BY MOUTH EVERY DAY 90 tablet 1  . aspirin EC 81 MG tablet Take 81 mg by mouth daily.    . Multiple Vitamin (MULTIVITAMIN) tablet Take 1 tablet by mouth daily.    Marland Kitchen omeprazole (PRILOSEC) 20 MG capsule Take 20 mg by mouth daily.    Marland Kitchen tobramycin-dexamethasone (TOBRADEX) ophthalmic solution INSTILL 1 DROP INTO LEFT EYE 3 TIMES A DAY FOR 7 DAYS THEN ONCE DAILY FOR 7 MORE DAYS     No current facility-administered medications for this visit.     PHYSICAL EXAMINATION: ECOG PERFORMANCE STATUS: 0 - Asymptomatic Vitals:   12/26/20 0940  BP: (!) 167/84  Pulse: 66  Resp: 16  Temp: (!) 97.2 F (36.2 C)  SpO2: 100%   Filed Weights   12/26/20 0940  Weight: 135 lb (61.2 kg)    Physical Exam Constitutional:      General: She is not in acute distress. HENT:     Head: Normocephalic and atraumatic.  Eyes:     General: No scleral icterus. Cardiovascular:     Rate and Rhythm: Normal rate and regular rhythm.     Heart sounds: Normal heart sounds.  Pulmonary:     Effort: Pulmonary effort is normal. No respiratory distress.     Breath sounds: No wheezing.  Abdominal:     General: Bowel sounds are normal. There is no distension.     Palpations: Abdomen is soft.  Musculoskeletal:        General: No deformity. Normal range of motion.     Cervical back: Normal range of motion and neck supple.  Skin:    General: Skin is warm and dry.     Findings: No erythema or rash.  Neurological:     Mental Status: She is alert and oriented to person, place, and time. Mental status is at baseline.     Cranial Nerves: No cranial nerve deficit.     Coordination: Coordination normal.  Psychiatric:        Mood and Affect: Mood normal.     LABORATORY DATA:  I have reviewed the data as listed Lab Results  Component Value Date   WBC 7.5 12/26/2020   HGB 14.8 12/26/2020   HCT 41.9 12/26/2020   MCV 89.7 12/26/2020   PLT 259 12/26/2020   Recent Labs    05/09/20 0957 07/05/20 1137 08/15/20 1020  12/26/20 0918  NA 142  --   --   --   K 4.4  --   --   --   CL 102  --   --   --   CO2 24  --   --   --   GLUCOSE 90  --   --   --   BUN 12  --   --   --   CREATININE 0.69  --   --   --   CALCIUM 10.1  --   --   --   GFRNONAA 87  --   --   --   GFRAA 100  --   --   --   PROT 7.1 7.1 8.1 7.8  ALBUMIN 4.4 4.4 4.3 4.2  AST 36 70* 37 33  ALT 33* 55* 31 31  ALKPHOS 87 82 72 73  BILITOT 0.3 0.5 0.5 0.4  BILIDIR  --  0.14 0.1 <0.1  IBILI  --   --  0.4 NOT CALCULATED   Iron/TIBC/Ferritin/ %Sat    Component Value Date/Time   IRON 103 12/26/2020 0918   IRON 44 07/05/2020 1137   TIBC 381 12/26/2020 0918   TIBC 315 07/05/2020 1137   FERRITIN 76 12/26/2020 0918   FERRITIN 356 (H) 07/05/2020 1137   IRONPCTSAT 27 12/26/2020 0918   IRONPCTSAT 14 (L) 07/05/2020 1137      RADIOGRAPHIC STUDIES: I have personally reviewed the radiological images as listed and agreed with the findings in the report. No results found.    ASSESSMENT & PLAN:  1. Hemochromatosis associated with compound heterozygous mutation in HFE gene ([place])   2. Hepatic steatosis    #Compound heterozygous hemochromatosis mutation Patient carries 2 mutations [id number] and H63D Ferritin is less than 500. Labs are reviewed and discussed with patient. Ferritin level has decreased to 76, iron saturation 27. No need for phlebotomy given that she is asymptomatic, with ferritin less than 500 and normal liver function testing. Transaminitis has completely resolved. Recommend first-degree relatives to be checked for hemochromatosis mutations. Recommend to avoid iron supplementation, alcohol use . Ultrasound findings were reviewed and discussed with patient.  She has fatty liver disease.  Recommend exercise, diet changes and follow-up with primary care provider.  Follow-up in 6 months.  Orders Placed This Encounter  Procedures  . CBC with Differential/Platelet    Standing Status:   Future    Standing Expiration Date:    12/26/2021  . Ferritin    Standing Status:   Future    Standing Expiration Date:   12/26/2021  . Iron and TIBC    Standing Status:   Future    Standing Expiration Date:   12/26/2021    All questions were answered. The patient knows to call the clinic with any problems questions or concerns.  cc Lavera Guise, MD    Return of visit: 6 months  Earlie Server, MD, PhD Hematology Oncology [facility] Pager- [phone] 12/26/2020

## 2020-12-27 ENCOUNTER — Ambulatory Visit: Payer: Medicare HMO | Admitting: Hospice and Palliative Medicine

## 2021-01-16 ENCOUNTER — Ambulatory Visit: Payer: Medicare HMO | Admitting: Physician Assistant

## 2021-01-16 ENCOUNTER — Ambulatory Visit: Payer: Medicare HMO | Admitting: Hospice and Palliative Medicine

## 2021-01-16 ENCOUNTER — Encounter: Payer: Self-pay | Admitting: Physician Assistant

## 2021-01-16 DIAGNOSIS — K219 Gastro-esophageal reflux disease without esophagitis: Secondary | ICD-10-CM

## 2021-01-16 DIAGNOSIS — I10 Essential (primary) hypertension: Secondary | ICD-10-CM | POA: Diagnosis not present

## 2021-01-16 DIAGNOSIS — K760 Fatty (change of) liver, not elsewhere classified: Secondary | ICD-10-CM

## 2021-01-16 NOTE — Progress Notes (Signed)
[facility] [place], [place] [address]  Internal MEDICINE  Office Visit Note  Patient Name: Cynthia Keith  [id number]  [id number]  Date of Service: 01/21/2021  Chief Complaint  Patient presents with  . Follow-up  . Gastroesophageal Reflux  . Hyperlipidemia  . Hypertension  . Pain    Upper right leg     HPI Pt is here for a 2 month f/u. She has been doing well. She is followed by Dr. Tasia Catchings for [facility] and they are seeing her again in July. She notes occasional itchy spots on her legs that arer being well controlled with topical cream. BP is usually fine at home. Systolic 0000000. She has been up and all over running errands this morning. She is taking her BP meds at night with a baby aspirin. 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 [facility] weeks [facility] bc of congestion and was started on ABX bc they thought she might be at risk of developing PNA. Feels great now.  Current Medication: Outpatient Encounter Medications as of 01/16/2021  Medication Sig  . amLODipine (NORVASC) 10 MG tablet TAKE 1 TABLET BY MOUTH EVERY DAY  . aspirin EC 81 MG tablet Take 81 mg by mouth daily.  . Multiple Vitamin (MULTIVITAMIN) tablet Take 1 tablet by mouth daily.  Marland Kitchen omeprazole (PRILOSEC) 20 MG capsule Take 20 mg by mouth daily.  Marland Kitchen tobramycin-dexamethasone (TOBRADEX) ophthalmic solution INSTILL 1 DROP INTO LEFT EYE 3 TIMES A DAY FOR 7 DAYS THEN ONCE DAILY FOR 7 MORE DAYS   No facility-administered encounter medications on file as of 01/16/2021.    Surgical History: Past Surgical History:  Procedure Laterality Date  . ABDOMINAL HYSTERECTOMY    . COLONOSCOPY WITH PROPOFOL N/A 12/12/2015   Procedure: COLONOSCOPY WITH PROPOFOL;  Surgeon: Manya Silvas, MD;  Location: [facility] ENDOSCOPY;  Service: Endoscopy;  Laterality: N/A;  . HAND SURGERY  2005    Medical History: Past Medical History:  Diagnosis Date  . GERD  (gastroesophageal reflux disease)   . Hyperlipidemia   . Hypertension   . Plantar fasciitis   . Sleep apnea     Family History: Family History  Problem Relation Age of Onset  . Breast cancer Cousin   . Stroke Mother   . Heart disease Paternal Grandmother   . Cancer Paternal Grandfather   . Stroke Sister   . Stroke Brother     Social History   Socioeconomic History  . Marital status: Married    Spouse name: Not on file  . Number of children: Not on file  . Years of education: Not on file  . Highest education level: Not on file  Occupational History  . Not on file  Tobacco Use  . Smoking status: Never Smoker  . Smokeless tobacco: Never Used  Vaping Use  . Vaping Use: Never used  Substance and Sexual Activity  . Alcohol use: Yes    Comment: ocassionally  . Drug use: No  . Sexual activity: Yes    Birth control/protection: Post-menopausal, Surgical  Other Topics Concern  . Not on file  Social History Narrative  . Not on file   Social Determinants of Health   Financial Resource Strain: Not on file  Food Insecurity: Not on file  Transportation Needs: Not on file  Physical Activity: Not on file  Stress: Not on file  Social Connections: Not on file  Intimate Partner Violence: Not on file  Review of Systems  Constitutional: Negative for appetite change, fatigue, fever and unexpected weight change.  HENT: Negative for congestion and postnasal drip.   Respiratory: Negative for cough, shortness of breath and wheezing.   Cardiovascular: Negative for chest pain and leg swelling.  Gastrointestinal: Negative for abdominal pain, diarrhea, nausea and vomiting.  Genitourinary: Negative for difficulty urinating and dysuria.  Skin:       A few itchy spots on her legs  Psychiatric/Behavioral: The patient is not nervous/anxious.     Vital Signs: BP (!) 162/80   Pulse 68   Temp 97.9 F (36.6 C)   Resp 16   Ht 4\' 8"  (1.422 m)   Wt 135 lb (61.2 kg)   SpO2 96%    BMI 30.27 kg/m    Physical Exam Constitutional:      Appearance: She is obese.  HENT:     Head: Normocephalic and atraumatic.     Right Ear: Tympanic membrane normal.     Left Ear: Tympanic membrane normal.     Nose: Nose normal.     Mouth/Throat:     Mouth: Mucous membranes are moist.  Eyes:     Extraocular Movements: Extraocular movements intact.     Pupils: Pupils are equal, round, and reactive to light.  Cardiovascular:     Rate and Rhythm: Normal rate and regular rhythm.     Pulses: Normal pulses.     Heart sounds: Normal heart sounds.  Pulmonary:     Effort: Pulmonary effort is normal.     Breath sounds: Normal breath sounds.  Abdominal:     General: Abdomen is flat. Bowel sounds are normal.     Palpations: Abdomen is soft.  Musculoskeletal:        General: Normal range of motion.     Cervical back: Normal range of motion.  Skin:    General: Skin is warm and dry.  Neurological:     General: No focal deficit present.     Mental Status: She is alert.  Psychiatric:        Mood and Affect: Mood normal.        Behavior: Behavior normal.        Thought Content: Thought content normal.        Judgment: Judgment normal.       Assessment/Plan: 1. Essential hypertension BP was a little elevated still on recheck, but did improve form initial intake. She sates it is usually well controlled at home and will continue checking it at home. Will continue to monitor.   2. Fatty Liver disease Identified on abdominal US which showed no change in liver size from previous study.  Dr. Tasia Catchings discussed these findings with the pt at their visit 3 weeks ago. Recommend improving diet and exercise. Will continue to monitor.  3. Hemachromatosis, unspecified Followed by Dr. Tasia Catchings. Did no require phlebotomy a las visit. Reinforced that she should avoid alcohol use and any iron supplements  4. Gastroesophageal reflux disease without esophagitis Stable. Will continue to take medication as  needed.  General Counseling: abriana saltos understanding of the findings of todays visit and agrees with plan of treatment. I have discussed any further diagnostic evaluation that may be needed or ordered today. We also reviewed her medications today. she has been encouraged to call the office with any questions or concerns that should arise related to todays visit.  Hypertension Counseling:   The following hypertensive lifestyle modification were recommended and discussed:  1. Limiting alcohol intake  to less than 1 oz/day of ethanol:(24 oz of beer or 8 oz of wine or 2 oz of 100-proof whiskey). 2. Take baby ASA 81 mg daily. 3. Importance of regular aerobic exercise and losing weight. 4. Reduce dietary saturated fat and cholesterol intake for overall cardiovascular health. 5. Maintaining adequate dietary potassium, calcium, and magnesium intake. 6. Regular monitoring of the blood pressure. 7. Reduce sodium intake to less than 100 mmol/day (less than 2.3 gm of sodium or less than 6 gm of sodium choride)   No orders of the defined types were placed in this encounter.   No orders of the defined types were placed in this encounter.   Total time spent:30 Minutes Time spent includes review of chart, medications, test results, and follow up plan with the patient.      Dr Lavera Guise Internal medicine

## 2021-01-23 ENCOUNTER — Ambulatory Visit: Payer: Medicare HMO | Admitting: Hospice and Palliative Medicine

## 2021-01-23 ENCOUNTER — Telehealth: Payer: Self-pay

## 2021-01-23 NOTE — Telephone Encounter (Signed)
Called pt no answer. LM for pt to call back to r/s appt.

## 2021-01-30 ENCOUNTER — Encounter: Payer: Self-pay | Admitting: Hospice and Palliative Medicine

## 2021-01-30 ENCOUNTER — Ambulatory Visit (INDEPENDENT_AMBULATORY_CARE_PROVIDER_SITE_OTHER): Payer: Medicare HMO | Admitting: Hospice and Palliative Medicine

## 2021-01-30 VITALS — BP 142/70 | HR 77 | Temp 97.9°F | Resp 16 | Ht <= 58 in | Wt 136.0 lb

## 2021-01-30 DIAGNOSIS — R102 Pelvic and perineal pain: Secondary | ICD-10-CM | POA: Diagnosis not present

## 2021-01-30 DIAGNOSIS — R1031 Right lower quadrant pain: Secondary | ICD-10-CM

## 2021-01-30 DIAGNOSIS — I10 Essential (primary) hypertension: Secondary | ICD-10-CM

## 2021-01-30 LAB — POCT URINALYSIS DIPSTICK
Bilirubin, UA: NEGATIVE
Blood, UA: NEGATIVE
Glucose, UA: NEGATIVE
Ketones, UA: NEGATIVE
Leukocytes, UA: NEGATIVE
Nitrite, UA: NEGATIVE
Protein, UA: NEGATIVE
Spec Grav, UA: 1.01 (ref 1.010–1.025)
Urobilinogen, UA: 0.2 E.U./dL
pH, UA: 6 (ref 5.0–8.0)

## 2021-01-30 MED ORDER — TRAMADOL HCL 50 MG PO TABS: 50.0000 mg | ORAL_TABLET | Freq: Three times a day (TID) | ORAL | 0 refills | Status: DC | PRN

## 2021-01-30 NOTE — Progress Notes (Addendum)
[facility] [place], [place] [address]  Internal MEDICINE  Office Visit Note  Patient Name: Cynthia Keith  [id number]  [id number]  Date of Service: 02/03/2021  Chief Complaint  Patient presents with   Groin Pain    Right side    Hypertension     HPI Pt is here for a sick visit. Right side groin/pelvic pain--history of bladder prolapse Pain has been ongoing for a few months and will intermittently have pain, over the last few days the pain has been constant Describes pain as sharp and pressure in her pelvic area Denies urinary symptoms such as burning or increased frequency S/p robotic assisted sacrocolpopexy, mid urethral sling, cystoscopy 2016 at [facility] Has not had follow-up with them since 2020  Pain is mostly when she is standing or walking, no radiation of pain First noticed pain when she stood up getting out of her car  Current Medication:  Outpatient Encounter Medications as of 01/30/2021  Medication Sig   amLODipine (NORVASC) 10 MG tablet TAKE 1 TABLET BY MOUTH EVERY DAY   aspirin EC 81 MG tablet Take 81 mg by mouth daily.   Multiple Vitamin (MULTIVITAMIN) tablet Take 1 tablet by mouth daily.   omeprazole (PRILOSEC) 20 MG capsule Take 20 mg by mouth daily.   tobramycin-dexamethasone (TOBRADEX) ophthalmic solution INSTILL 1 DROP INTO LEFT EYE 3 TIMES A DAY FOR 7 DAYS THEN ONCE DAILY FOR 7 MORE DAYS   traMADol (ULTRAM) 50 MG tablet Take 1 tablet (50 mg total) by mouth every 8 (eight) hours as needed.   No facility-administered encounter medications on file as of 01/30/2021.   Medical History: Past Medical History:  Diagnosis Date   GERD (gastroesophageal reflux disease)    Hyperlipidemia    Hypertension    Plantar fasciitis    Sleep apnea      Vital Signs: BP (!) 142/70   Pulse 77   Temp 97.9 F (36.6 C)   Resp 16   Ht 4\' 8"  (1.422 m)   Wt 136 lb (61.7 kg)   SpO2 97%   BMI 30.49 kg/m    Review of Systems  Constitutional:  Negative for chills, diaphoresis and fatigue.  HENT: Negative for ear pain, postnasal drip and sinus pressure.   Eyes: Negative for photophobia, discharge, redness, itching and visual disturbance.  Respiratory: Negative for cough, shortness of breath and wheezing.   Cardiovascular: Negative for chest pain, palpitations and leg swelling.  Gastrointestinal: Negative for abdominal pain, constipation, diarrhea, nausea and vomiting.  Genitourinary: Negative for dysuria and flank pain.  Musculoskeletal: Negative for arthralgias, back pain, gait problem and neck pain.       Right inguinal/pelvic pain  Skin: Negative for color change.  Allergic/Immunologic: Negative for environmental allergies and food allergies.  Neurological: Negative for dizziness and headaches.  Hematological: Does not bruise/bleed easily.  Psychiatric/Behavioral: Negative for agitation, behavioral problems (depression) and hallucinations.    Physical Exam Vitals reviewed.  Constitutional:      Appearance: Normal appearance. She is normal weight.  Cardiovascular:     Rate and Rhythm: Normal rate and regular rhythm.     Pulses: Normal pulses.     Heart sounds: Normal heart sounds.  Pulmonary:     Effort: Pulmonary effort is normal.     Breath sounds: Normal breath sounds.  Abdominal:     General: Abdomen is flat.     Palpations: Abdomen is soft.  Musculoskeletal:     Cervical back: Normal range of  motion.     Comments: Right groin pain--apparent discomfort with movement and positional changes  Skin:    General: Skin is warm.  Neurological:     General: No focal deficit present.     Mental Status: She is alert and oriented to person, place, and time. Mental status is at baseline.  Psychiatric:        Mood and Affect: Mood normal.        Behavior: Behavior normal.        Thought Content: Thought content normal.        Judgment: Judgment normal.    Assessment/Plan: 1. Right inguinal pain Refer to previous  provider within [facility], might need a pelvic u/s May take tramadol as needed for pain - Ambulatory referral to Urology - traMADol (ULTRAM) 50 MG tablet; Take 1 tablet (50 mg total) by mouth every 8 (eight) hours as needed.  Dispense: 30 tablet; Refill: 0  2. Pelvic pain Urine no evidence of acute infection - POCT Urinalysis Dipstick - Ambulatory referral to Urology - traMADol (ULTRAM) 50 MG tablet; Take 1 tablet (50 mg total) by mouth every 8 (eight) hours as needed.  Dispense: 30 tablet; Refill: 0  3. Essential hypertension BP elevated today, likely due to pain--will monitor closely and adjust therapy as needed  General Counseling: Cynthia Keith understanding of the findings of todays visit and agrees with plan of treatment. I have discussed any further diagnostic evaluation that may be needed or ordered today. We also reviewed her medications today. she has been encouraged to call the office with any questions or concerns that should arise related to todays visit.  Cynthia Keith Controlled Substance Database was reviewed by me for overdose risk score (ORS)  Orders Placed This Encounter  Procedures   Ambulatory referral to Urology   POCT Urinalysis Dipstick    Meds ordered this encounter  Medications   traMADol (ULTRAM) 50 MG tablet    Sig: Take 1 tablet (50 mg total) by mouth every 8 (eight) hours as needed.    Dispense:  30 tablet    Refill:  0    Time spent: 30 Minutes Time spent includes review of chart, medications, test results and follow-up plan with the patient.  This patient was seen by Cynthia Keith AGNP-C in Collaboration with Dr Lavera Guise as a part of collaborative care agreement.  Tanna Furry [facility] Internal Medicine

## 2021-02-03 ENCOUNTER — Encounter: Payer: Self-pay | Admitting: Hospice and Palliative Medicine

## 2021-02-06 ENCOUNTER — Other Ambulatory Visit: Payer: Self-pay

## 2021-02-06 ENCOUNTER — Ambulatory Visit
Admission: RE | Admit: 2021-02-06 | Discharge: 2021-02-06 | Disposition: A | Discharge status: Home / Self Care | Payer: Medicare HMO | Source: Ambulatory Visit | Attending: Urology | Admitting: Urology

## 2021-02-06 DIAGNOSIS — N2889 Other specified disorders of kidney and ureter: Secondary | ICD-10-CM

## 2021-02-06 DIAGNOSIS — N261 Atrophy of kidney (terminal): Secondary | ICD-10-CM | POA: Diagnosis not present

## 2021-02-06 DIAGNOSIS — N281 Cyst of kidney, acquired: Secondary | ICD-10-CM | POA: Diagnosis not present

## 2021-02-07 ENCOUNTER — Telehealth: Payer: Self-pay

## 2021-02-07 NOTE — Telephone Encounter (Signed)
Stable right renal mass on ultrasound.  Has follow-up scheduled 02/13/2021

## 2021-02-07 NOTE — Telephone Encounter (Signed)
Incoming call report from [facility] radiology with renal scan results. Provider made aware.

## 2021-02-13 ENCOUNTER — Ambulatory Visit: Payer: Medicare HMO | Admitting: Urology

## 2021-02-13 ENCOUNTER — Other Ambulatory Visit: Payer: Self-pay

## 2021-02-13 ENCOUNTER — Encounter: Payer: Self-pay | Admitting: Urology

## 2021-02-13 VITALS — BP 119/70 | HR 90 | Ht 60.0 in | Wt 136.0 lb

## 2021-02-13 DIAGNOSIS — R300 Dysuria: Secondary | ICD-10-CM

## 2021-02-13 DIAGNOSIS — N2889 Other specified disorders of kidney and ureter: Secondary | ICD-10-CM | POA: Diagnosis not present

## 2021-02-13 LAB — MICROSCOPIC EXAMINATION

## 2021-02-13 LAB — URINALYSIS, COMPLETE
Bilirubin, UA: NEGATIVE
Glucose, UA: NEGATIVE
Ketones, UA: NEGATIVE
Nitrite, UA: NEGATIVE
Protein,UA: NEGATIVE
RBC, UA: NEGATIVE
Specific Gravity, UA: 1.01 (ref 1.005–1.030)
Urobilinogen, Ur: 0.2 mg/dL (ref 0.2–1.0)
pH, UA: 7 (ref 5.0–7.5)

## 2021-02-13 NOTE — Progress Notes (Signed)
02/13/2021 9:16 AM   Cynthia Keith 27-Mar-1946 [id number]  Referring provider: Lavera Guise, [place],  [place] [address]  Chief Complaint  Patient presents with  . Other     Urologic History 1. Renal Mass - 2 x 1.5 x 1.4 cm enhancing right renal mass MRI 2009 - Surveillance elective   HPI: 75 y.o. female presents for annual follow-up.   No problems since last years visit  Has noted increased urinary frequency and urgency with mild dysuria  Denies gross hematuria  Recent episode of right groin pain, saw her PCP who has scheduled a gynecology evaluation   RUS performed 02/06/2021 shows a stable 14 x 15 x 12 mm exophytic hypoechoic mass right lower pole (last imaging 14 x 12 x 12)   PMH: Past Medical History:  Diagnosis Date  . GERD (gastroesophageal reflux disease)   . Hyperlipidemia   . Hypertension   . Plantar fasciitis   . Sleep apnea     Surgical History: Past Surgical History:  Procedure Laterality Date  . ABDOMINAL HYSTERECTOMY    . COLONOSCOPY WITH PROPOFOL N/A 12/12/2015   Procedure: COLONOSCOPY WITH PROPOFOL;  Surgeon: Manya Silvas, MD;  Location[facility] ENDOSCOPY;  Service: Endoscopy;  Laterality: N/A;  . HAND SURGERY  2005    Home Medications:  Allergies as of 02/13/2021      Reactions   Iodinated Diagnostic Agents Anaphylaxis   Remote hx of respiratory arrest after IV iodine for kidney study. Immediately treated w/o sequela. No previous or subsequent IVP exposure. Remote hx of respiratory arrest after IV iodine for kidney study. Immediately treated w/o sequela. No previous or subsequent IVP exposure. Remote hx of respiratory arrest after IV iodine for kidney study. Immediately treated w/o sequela. No previous or subsequent IVP exposure.   Iodine Shortness Of Breath   "Quit Breathing"      Medication List       Accurate as of February 13, 2021  9:16 AM. If you have any questions, ask your nurse or doctor.         amLODipine 10 MG tablet Commonly known as: NORVASC TAKE 1 TABLET BY MOUTH EVERY DAY   aspirin EC 81 MG tablet Take 81 mg by mouth daily.   multivitamin tablet Take 1 tablet by mouth daily.   omeprazole 20 MG capsule Commonly known as: PRILOSEC Take 20 mg by mouth daily.   tobramycin-dexamethasone ophthalmic solution Commonly known as: TOBRADEX INSTILL 1 DROP INTO LEFT EYE 3 TIMES A DAY FOR 7 DAYS THEN ONCE DAILY FOR 7 MORE DAYS   traMADol 50 MG tablet Commonly known as: ULTRAM Take 1 tablet (50 mg total) by mouth every 8 (eight) hours as needed.       Allergies:  Allergies  Allergen Reactions  . Iodinated Diagnostic Agents Anaphylaxis    Remote hx of respiratory arrest after IV iodine for kidney study. Immediately treated w/o sequela. No previous or subsequent IVP exposure. Remote hx of respiratory arrest after IV iodine for kidney study. Immediately treated w/o sequela. No previous or subsequent IVP exposure. Remote hx of respiratory arrest after IV iodine for kidney study. Immediately treated w/o sequela. No previous or subsequent IVP exposure.  . Iodine Shortness Of Breath    "Quit Breathing"    Family History: Family History  Problem Relation Age of Onset  . Breast cancer Cousin   . Stroke Mother   . Heart disease Paternal Grandmother   . Cancer Paternal Grandfather   .  Stroke Sister   . Stroke Brother     Social History:  reports that she has never smoked. She has never used smokeless tobacco. She reports current alcohol use. She reports that she does not use drugs.   Physical Exam: BP 119/70   Pulse 90   Ht 5' (1.524 m)   Wt 136 lb (61.7 kg)   BMI 26.56 kg/m   Constitutional:  Alert and oriented, No acute distress. HEENT: [place] AT, moist mucus membranes.  Trachea midline, no masses. Cardiovascular: No clubbing, cyanosis, or edema. Respiratory: Normal respiratory effort, no increased work of breathing. Psychiatric: Normal mood and  affect.   Pertinent Imaging:  Ultrasound renal complete  Narrative CLINICAL DATA:  RIGHT renal mass  EXAM: RENAL / URINARY TRACT ULTRASOUND COMPLETE  COMPARISON:  01/25/2020  FINDINGS: Right Kidney:  Renal measurements: 9.8 x 5.1 x 4.6 cm = volume: 121 mL. Cortical thinning. Normal cortical echogenicity. Septated cyst at lower pole RIGHT kidney 14 x 9 x 8 mm. Additional exophytic hypoechoic nodule at lower pole 14 x 15 x 12 mm (previously 14 x 12 x 12 mm) cannot exclude neoplasm. No hydronephrosis or shadowing calcifications.  Left Kidney:  Renal measurements: 9.5 x 4.5 x 5.1 cm = volume: 114 mL. Cortical thinning. Normal cortical echogenicity. No mass or hydronephrosis. No shadowing calcifications.  Bladder:  Appears normal for degree of bladder distention.  Other:  N/A  IMPRESSION: Medical renal disease changes and cortical atrophy of both kidneys.  Mildly complicated cyst lower pole RIGHT kidney 14 mm greatest size, not seen on prior exam.  14 x 15 x 12 mm solid-appearing hypoechoic nodule at lower pole RIGHT kidney slightly increased from the 12 x 14 x 12 mm on the prior exam, cannot exclude slow growing solid neoplasm; recommend MR assessment.  No additional renal sonographic abnormalities.  These results will be called to the ordering clinician or representative by the Radiologist Assistant, and communication documented in the PACS or [employer].   Electronically Signed By: Lavonia Dana M.D. On: 02/06/2021 15:13    Assessment & Plan:    1.  Renal mass  Stable  Continue annual follow-up with renal ultrasound  2.  Dysuria  Urinalysis ordered   Abbie Sons, MD  [facility] [address], [place], [place] [address] [phone]

## 2021-02-13 NOTE — Addendum Note (Signed)
Addended by: Chrystie Nose on: 02/13/2021 02:14 PM   Modules accepted: Orders

## 2021-02-14 ENCOUNTER — Telehealth: Payer: Self-pay | Admitting: *Deleted

## 2021-02-14 NOTE — Telephone Encounter (Signed)
-----   Message from Abbie Sons, MD sent at 02/14/2021  2:32 PM EST ----- Please notify patient that her urinalysis looks like she has an infection.  Urine culture has been ordered.  Since she was having minimal symptoms I would recommend waiting until the culture result returns before treating.  Have her call earlier if her urinary symptoms worsen.

## 2021-02-14 NOTE — Telephone Encounter (Signed)
Notified patient as instructed, patient pleased. Discussed follow-up appointments, patient agrees  

## 2021-02-15 LAB — CULTURE, URINE COMPREHENSIVE

## 2021-02-17 ENCOUNTER — Telehealth: Payer: Self-pay | Admitting: *Deleted

## 2021-02-17 ENCOUNTER — Other Ambulatory Visit: Payer: Self-pay | Admitting: Urology

## 2021-02-17 MED ORDER — AMOXICILLIN 875 MG PO TABS: 875.0000 mg | ORAL_TABLET | Freq: Two times a day (BID) | ORAL | 0 refills | Status: DC

## 2021-02-17 NOTE — Telephone Encounter (Signed)
Notified patient as instructed, patient pleased. Discussed follow-up appointments, patient agrees  

## 2021-02-17 NOTE — Telephone Encounter (Signed)
-----   Message from Abbie Sons, MD sent at 02/17/2021  1:05 PM EST ----- Urine culture was positive.  Rx amoxicillin sent to pharmacy

## 2021-02-19 ENCOUNTER — Other Ambulatory Visit: Payer: Self-pay | Admitting: Adult Health

## 2021-02-19 DIAGNOSIS — I10 Essential (primary) hypertension: Secondary | ICD-10-CM

## 2021-02-20 ENCOUNTER — Encounter: Payer: Medicare HMO | Admitting: Dermatology

## 2021-02-20 ENCOUNTER — Ambulatory Visit: Payer: Self-pay | Admitting: Urology

## 2021-02-23 ENCOUNTER — Other Ambulatory Visit: Payer: Self-pay

## 2021-02-23 DIAGNOSIS — I10 Essential (primary) hypertension: Secondary | ICD-10-CM

## 2021-02-23 MED ORDER — AMLODIPINE BESYLATE 10 MG PO TABS: 10.0000 mg | ORAL_TABLET | Freq: Every day | ORAL | 1 refills | Status: DC

## 2021-03-13 ENCOUNTER — Telehealth: Payer: Self-pay

## 2021-03-13 ENCOUNTER — Other Ambulatory Visit: Payer: Self-pay | Admitting: Internal Medicine

## 2021-03-13 DIAGNOSIS — R928 Other abnormal and inconclusive findings on diagnostic imaging of breast: Secondary | ICD-10-CM

## 2021-03-13 NOTE — Telephone Encounter (Signed)
LMOM for pt to call and schedule [place]

## 2021-03-13 NOTE — Telephone Encounter (Signed)
Pt needs to have follow up U/S per radiology, please advise pt that order has been done and she can call them

## 2021-03-15 ENCOUNTER — Ambulatory Visit: Payer: Medicare HMO | Admitting: Dermatology

## 2021-03-15 ENCOUNTER — Other Ambulatory Visit: Payer: Self-pay | Admitting: Internal Medicine

## 2021-03-15 ENCOUNTER — Other Ambulatory Visit: Payer: Self-pay

## 2021-03-15 DIAGNOSIS — L820 Inflamed seborrheic keratosis: Secondary | ICD-10-CM | POA: Diagnosis not present

## 2021-03-15 DIAGNOSIS — L918 Other hypertrophic disorders of the skin: Secondary | ICD-10-CM | POA: Diagnosis not present

## 2021-03-15 DIAGNOSIS — D2372 Other benign neoplasm of skin of left lower limb, including hip: Secondary | ICD-10-CM

## 2021-03-15 DIAGNOSIS — D2239 Melanocytic nevi of other parts of face: Secondary | ICD-10-CM

## 2021-03-15 DIAGNOSIS — L578 Other skin changes due to chronic exposure to nonionizing radiation: Secondary | ICD-10-CM

## 2021-03-15 DIAGNOSIS — L814 Other melanin hyperpigmentation: Secondary | ICD-10-CM | POA: Diagnosis not present

## 2021-03-15 DIAGNOSIS — L570 Actinic keratosis: Secondary | ICD-10-CM

## 2021-03-15 DIAGNOSIS — L821 Other seborrheic keratosis: Secondary | ICD-10-CM | POA: Diagnosis not present

## 2021-03-15 DIAGNOSIS — D229 Melanocytic nevi, unspecified: Secondary | ICD-10-CM | POA: Diagnosis not present

## 2021-03-15 DIAGNOSIS — N6489 Other specified disorders of breast: Secondary | ICD-10-CM

## 2021-03-15 DIAGNOSIS — D485 Neoplasm of uncertain behavior of skin: Secondary | ICD-10-CM

## 2021-03-15 DIAGNOSIS — D225 Melanocytic nevi of trunk: Secondary | ICD-10-CM | POA: Diagnosis not present

## 2021-03-15 DIAGNOSIS — Z1283 Encounter for screening for malignant neoplasm of skin: Secondary | ICD-10-CM

## 2021-03-15 DIAGNOSIS — L300 Nummular dermatitis: Secondary | ICD-10-CM

## 2021-03-15 DIAGNOSIS — I781 Nevus, non-neoplastic: Secondary | ICD-10-CM

## 2021-03-15 HISTORY — DX: Actinic keratosis: L57.0

## 2021-03-15 MED ORDER — CLOBETASOL PROPIONATE 0.05 % EX SOLN: CUTANEOUS | 1 refills | Status: DC

## 2021-03-15 NOTE — Patient Instructions (Addendum)

## 2021-03-15 NOTE — Progress Notes (Signed)
Follow-Up Visit   Subjective  Cynthia Keith is a 75 y.o. female who presents for the following: Annual Exam (Patient here for TBSE. She has a history of itchy patches on her body that she uses Clobetasol/CeraVe mix. No history of skin cancer.).  She needs rfs.  She has a couple irritated growths on chest and shoulder.    The following portions of the chart were reviewed this encounter and updated as appropriate:       Review of Systems:  No other skin or systemic complaints except as noted in HPI or Assessment and Plan.  Objective  Well appearing patient in no apparent distress; mood and affect are within normal limits.  A full examination was performed including scalp, head, eyes, ears, nose, lips, neck, chest, axillae, abdomen, back, buttocks, bilateral upper extremities, bilateral lower extremities, hands, feet, fingers, toes, fingernails, and toenails. All findings within normal limits unless otherwise noted below.  Objective  Right Mid Back at Braline: 4.70mm med dark brown macule  Left Alar crease: 3.66mm flesh papule  Left gluteal cleft: 7.16mm brown papule  Right Inframammary: 8.0 x 3.71mm med dark brown macule  Objective  R sternum x 1, L upper arm x 1 (2): Erythematous keratotic or waxy stuck-on papule   Objective  Right Posterior Ankle: Nummular pink patch.  Itchy per pt  Objective  Left Posterior Thigh: 1.5 x 0.7cm brown macule with irregular border      Assessment & Plan   Skin cancer screening performed today.  Actinic Damage - chronic, secondary to cumulative UV radiation exposure/sun exposure over time - diffuse scaly erythematous macules with underlying dyspigmentation - Recommend daily broad spectrum sunscreen SPF 30+ to sun-exposed areas, reapply every 2 hours as needed.  - Recommend staying in the shade or wearing long sleeves, sun glasses (UVA+UVB protection) and wide brim hats (4-inch brim around the entire circumference of the hat). -  Call for new or changing lesions.  Melanocytic Nevi - Tan-brown and/or pink-flesh-colored symmetric macules and papules - Benign appearing on exam today - Observation - Call clinic for new or changing moles - Recommend daily use of broad spectrum spf 30+ sunscreen to sun-exposed areas.   Lentigines - Scattered tan macules - Due to sun exposure - Benign-appering, observe - Recommend daily broad spectrum sunscreen SPF 30+ to sun-exposed areas, reapply every 2 hours as needed. - Call for any changes  Seborrheic Keratoses - Stuck-on, waxy, tan-brown papules and plaques  - Discussed benign etiology and prognosis. - Observe - Call for any changes  Acrochordons (Skin Tags) - Fleshy, skin-colored pedunculated papules - Benign appearing.  - Observe. - If desired, they can be removed with an in office procedure that is not covered by insurance. - Please call the clinic if you notice any new or changing lesions.  Spider Veins - Dilated blue, purple or red veins at the lower extremities - Reassured - These can be treated by sclerotherapy (a procedure to inject a medicine into the veins to make them disappear) if desired, but the treatment is not covered by insurance  Nevus Spilus - Brown macules or papules within lighter tan patch of the left posterior thigh, 4.0cm - Genetic - Benign, observe - Call for any changes    Nevus (4) Right Inframammary; Left gluteal cleft; Right Mid Back at Braline; Left Alar crease  Benign-appearing.  Observation.  Call clinic for new or changing moles.  Recommend daily use of broad spectrum spf 30+ sunscreen to sun-exposed areas.  Discussed biopsy to the left alar crease and left gluteal cleft if becomes irritated. Patient may consider in future.    Inflamed seborrheic keratosis (2) R sternum x 1, L upper arm x 1  Prior to procedure, discussed risks of blister formation, small wound, skin dyspigmentation, or rare scar following cryotherapy.     Destruction of lesion - R sternum x 1, L upper arm x 1  Destruction method: cryotherapy   Informed consent: discussed and consent obtained   Lesion destroyed using liquid nitrogen: Yes   Region frozen until ice ball extended beyond lesion: Yes   Outcome: patient tolerated procedure well with no complications   Post-procedure details: wound care instructions given    Nummular dermatitis Right Posterior Ankle  Mild flare Continue Clobetasol/CeraVe mix qd/bid prn. Avoid face, groin, axilla.  Recommend mild soap and moisturizing cream 1-2 times daily.  Gentle skin care handout provided.    Topical steroids (such as triamcinolone, fluocinolone, fluocinonide, mometasone, clobetasol, halobetasol, betamethasone, hydrocortisone) can cause thinning and lightening of the skin if they are used for too long in the same area. Your physician has selected the right strength medicine for your problem and area affected on the body. Please use your medication only as directed by your physician to prevent side effects.    clobetasol (TEMOVATE) 0.05 % external solution - Right Posterior Ankle  Neoplasm of uncertain behavior of skin Left Posterior Thigh  Skin / nail biopsy Type of biopsy: tangential   Informed consent: discussed and consent obtained   Patient was prepped and draped in usual sterile fashion: Area prepped with alcohol. Anesthesia: the lesion was anesthetized in a standard fashion   Anesthetic:  1% lidocaine w/ epinephrine 1-100,000 buffered w/ 8.4% NaHCO3 Instrument used: flexible razor blade   Hemostasis achieved with: pressure, aluminum chloride and electrodesiccation   Outcome: patient tolerated procedure well   Post-procedure details: wound care instructions given   Post-procedure details comment:  Ointment and small bandage applied  Specimen 1 - Surgical pathology Differential Diagnosis: SK vs Lentigo r/o Atypia Check Margins: No 1.5 x 0.7cm brown macule with irregular  border  Return in about 1 year (around 03/15/2022) for TBSE.   IJamesetta Orleans, CMA, am acting as scribe for Brendolyn Patty, MD .  Documentation: I have reviewed the above documentation for accuracy and completeness, and I agree with the above.  Brendolyn Patty MD

## 2021-03-20 ENCOUNTER — Telehealth: Payer: Self-pay

## 2021-03-20 NOTE — Telephone Encounter (Signed)
Discussed biopsy results precancer, if area come back return for LN2

## 2021-03-20 NOTE — Telephone Encounter (Signed)
-----   Message from Brendolyn Patty, MD sent at 03/20/2021  2:55 PM EDT ----- Skin , Left post thigh, shave ACTINIC KERATOSIS AND SOLAR LENTIGO  Precancer, needs LN2 if recurs

## 2021-03-22 ENCOUNTER — Telehealth: Payer: Self-pay

## 2021-03-22 NOTE — Telephone Encounter (Signed)
Spoke to pt, she will call tomorrow to schedule F/U breast US

## 2021-03-27 ENCOUNTER — Ambulatory Visit
Admission: RE | Admit: 2021-03-27 | Discharge: 2021-03-27 | Disposition: A | Discharge status: Home / Self Care | Payer: Medicare HMO | Source: Ambulatory Visit | Attending: Internal Medicine | Admitting: Internal Medicine

## 2021-03-27 ENCOUNTER — Other Ambulatory Visit: Payer: Self-pay

## 2021-03-27 DIAGNOSIS — N6489 Other specified disorders of breast: Secondary | ICD-10-CM | POA: Diagnosis not present

## 2021-03-27 DIAGNOSIS — N6011 Diffuse cystic mastopathy of right breast: Secondary | ICD-10-CM | POA: Diagnosis not present

## 2021-03-29 ENCOUNTER — Ambulatory Visit (INDEPENDENT_AMBULATORY_CARE_PROVIDER_SITE_OTHER): Payer: Medicare HMO

## 2021-03-29 ENCOUNTER — Other Ambulatory Visit: Payer: Self-pay

## 2021-03-29 DIAGNOSIS — R300 Dysuria: Secondary | ICD-10-CM

## 2021-03-29 LAB — POCT URINALYSIS DIPSTICK
Bilirubin, UA: NEGATIVE
Glucose, UA: NEGATIVE
Ketones, UA: NEGATIVE
Nitrite, UA: NEGATIVE
Protein, UA: POSITIVE — AB
Spec Grav, UA: 1.01 (ref 1.010–1.025)
Urobilinogen, UA: 0.2 E.U./dL
pH, UA: 8.5 — AB (ref 5.0–8.0)

## 2021-03-29 NOTE — Progress Notes (Signed)
Pt walked in to office informing us that she feels she has a UTI.  She has seen urology in the past and was treated in feb with amoxicillin.  We dipped her urine and we are sending culture to [facility].  We will treat her symptoms after we get culture back.  Called pt and informed her that we will wait for culture to treat her

## 2021-03-30 ENCOUNTER — Telehealth: Payer: Self-pay

## 2021-03-30 NOTE — Telephone Encounter (Signed)
Spoke with pt take AZO OTC and cranberry juice we waiting for culture result

## 2021-03-31 ENCOUNTER — Telehealth: Payer: Self-pay

## 2021-03-31 NOTE — Telephone Encounter (Signed)
Patient called stating that she is having UTI symptoms dysuria, pressure, frequency. She saw her PCP the other day and they sent her urine for culture. They are awaiting culture results before treating. Her symptoms are worsening and she wanted to know if our office would call in an abx to treat her based off of her last urine culture. It was explained that we would follow the same course as PCP unless her symptoms worsened to fever, chills, nausea and vomiting. She denies these symptoms. We would also not treat based off of another office culture. She was encourage to increase water intake and try AZO OTC. She states PCP also suggested this. Patient will wait to hear results from PCP if her symptoms worsen she will contact their office. If after she is treated by PCP her symptoms continue she will contact our office for further follow up.

## 2021-04-02 LAB — CULTURE, URINE COMPREHENSIVE

## 2021-04-03 ENCOUNTER — Telehealth: Payer: Self-pay

## 2021-04-03 ENCOUNTER — Other Ambulatory Visit: Payer: Self-pay

## 2021-04-03 ENCOUNTER — Other Ambulatory Visit: Payer: Self-pay | Admitting: Hospice and Palliative Medicine

## 2021-04-03 MED ORDER — CEPHALEXIN 500 MG PO CAPS: 500.0000 mg | ORAL_CAPSULE | Freq: Two times a day (BID) | ORAL | 0 refills | Status: DC

## 2021-04-03 NOTE — Progress Notes (Signed)
I will send Cephalexin to her pharmacy to UTI, take twice daily. She will need to be seen for recurring symptoms or she may call her urologist.

## 2021-04-03 NOTE — Telephone Encounter (Signed)
Called pt and informed her that we sent keflex 500 mg BID to pharmacy for UTI.  Informed pt that if symptoms persist that she will need to make an appointment

## 2021-04-04 ENCOUNTER — Other Ambulatory Visit: Payer: Self-pay | Admitting: Hospice and Palliative Medicine

## 2021-04-04 DIAGNOSIS — R1031 Right lower quadrant pain: Secondary | ICD-10-CM

## 2021-04-04 DIAGNOSIS — R102 Pelvic and perineal pain: Secondary | ICD-10-CM

## 2021-04-04 NOTE — Progress Notes (Signed)
Please call and let her know based on results and recurring symptoms we need to do an ultrasound of her bladder. We will be in contact to schedule this for her.

## 2021-04-05 ENCOUNTER — Other Ambulatory Visit: Payer: Self-pay | Admitting: Hospice and Palliative Medicine

## 2021-04-05 DIAGNOSIS — R319 Hematuria, unspecified: Secondary | ICD-10-CM

## 2021-04-05 DIAGNOSIS — R102 Pelvic and perineal pain: Secondary | ICD-10-CM

## 2021-04-24 ENCOUNTER — Other Ambulatory Visit: Payer: Self-pay

## 2021-04-24 ENCOUNTER — Encounter: Payer: Self-pay | Admitting: Obstetrics & Gynecology

## 2021-04-24 ENCOUNTER — Ambulatory Visit: Payer: Medicare HMO | Admitting: Obstetrics & Gynecology

## 2021-04-24 VITALS — BP 130/80 | Ht <= 58 in | Wt 137.0 lb

## 2021-04-24 DIAGNOSIS — N3000 Acute cystitis without hematuria: Secondary | ICD-10-CM | POA: Diagnosis not present

## 2021-04-24 DIAGNOSIS — R102 Pelvic and perineal pain: Secondary | ICD-10-CM | POA: Diagnosis not present

## 2021-04-24 LAB — POCT URINALYSIS DIPSTICK
Bilirubin, UA: NEGATIVE
Blood, UA: NEGATIVE
Glucose, UA: NEGATIVE
Ketones, UA: NEGATIVE
Nitrite, UA: NEGATIVE
Protein, UA: NEGATIVE
Spec Grav, UA: 1.01 (ref 1.010–1.025)
Urobilinogen, UA: 0.2 E.U./dL
pH, UA: 5 (ref 5.0–8.0)

## 2021-04-24 MED ORDER — SULFAMETHOXAZOLE-TRIMETHOPRIM 800-160 MG PO TABS: 1.0000 | ORAL_TABLET | Freq: Two times a day (BID) | ORAL | 0 refills | Status: AC

## 2021-04-24 NOTE — Patient Instructions (Signed)
Primary Care in the area  This is not meant to be comprehensive list, but a resource for some providers that you can call for counseling or medication needs. If you have a recommendation, please let us know.   [facility]:  [phone]               Dr Miguel Aschoff               Dr Juanetta Beets               Dr Lavon Paganini  [facility]:  [phone]  Dr Drue Stager [facility], [place]:   [phone]  Owens Loffler, MD  Waunita Schooner, MD  Ria Bush, MD  Jinny Sanders, MD  [facility], [place]: [phone]  Orland Mustard, MD    Urinary Tract Infection, Adult A urinary tract infection (UTI) is an infection of any part of the urinary tract. The urinary tract includes:  The kidneys.  The ureters.  The bladder.  The urethra. These organs make, store, and get rid of pee (urine) in the body. What are the causes? This infection is caused by germs (bacteria) in your genital area. These germs grow and cause swelling (inflammation) of your urinary tract. What increases the risk? The following factors may make you more likely to develop this condition:  Using a small, thin tube (catheter) to drain pee.  Not being able to control when you pee or poop (incontinence).  Being female. If you are female, these things can increase the risk: ? Using these methods to prevent pregnancy:  A medicine that kills sperm (spermicide).  A device that blocks sperm (diaphragm). ? Having low levels of a female hormone (estrogen). ? Being pregnant. You are more likely to develop this condition if:  You have genes that add to your risk.  You are sexually active.  You take antibiotic medicines.  You have trouble peeing because of: ? A prostate that is bigger than normal, if you are female. ? A blockage in the part of your body that drains pee from the bladder. ? A kidney stone. ? A nerve  condition that affects your bladder. ? Not getting enough to drink. ? Not peeing often enough.  You have other conditions, such as: ? Diabetes. ? A weak disease-fighting system (immune system). ? Sickle cell disease. ? Gout. ? Injury of the spine. What are the signs or symptoms? Symptoms of this condition include:  Needing to pee right away.  Peeing small amounts often.  Pain or burning when peeing.  Blood in the pee.  Pee that smells bad or not like normal.  Trouble peeing.  Pee that is cloudy.  Fluid coming from the vagina, if you are female.  Pain in the belly or lower back. Other symptoms include:  Vomiting.  Not feeling hungry.  Feeling mixed up (confused). This may be the first symptom in older adults.  Being tired and grouchy (irritable).  A fever.  Watery poop (diarrhea). How is this treated?  Taking antibiotic medicine.  Taking other medicines.  Drinking enough water. In some cases, you may need to see a specialist. Follow these instructions at home: Medicines  Take over-the-counter and prescription medicines only as told by your doctor.  If you were prescribed an antibiotic medicine, take it as told by your doctor. Do not stop taking it even if you start to feel better. General instructions  Make sure you: ? Pee until your bladder is empty. ? Do not hold pee for a long time. ? Empty your bladder after sex. ? Wipe from front to back after peeing or pooping if you are a female. Use each tissue one time when you wipe.  Drink enough fluid to keep your pee pale yellow.  Keep all follow-up visits.   Contact a doctor if:  You do not get better after 1-2 days.  Your symptoms go away and then come back. Get help right away if:  You have very bad back pain.  You have very bad pain in your lower belly.  You have a fever.  You have chills.  You feeling like you will vomit or you vomit. Summary  A urinary tract infection (UTI) is an  infection of any part of the urinary tract.  This condition is caused by germs in your genital area.  There are many risk factors for a UTI.  Treatment includes antibiotic medicines.  Drink enough fluid to keep your pee pale yellow. This information is not intended to replace advice given to you by your health care provider. Make sure you discuss any questions you have with your health care provider. Document Revised: 07/22/2020 Document Reviewed: 07/22/2020 Elsevier Patient Education  [place].

## 2021-04-24 NOTE — Progress Notes (Signed)
HPI:      [name] is a 75 y.o. [phone] who LMP was No LMP recorded. Patient has had a hysterectomy., presents today for a problem visit.  She reports pelvic pain mostly in groin areas and worse w walking at times.  Slight bulge feeling.  Prior 2016 surgery for prolapse.  Has had 3 UTI this year.  Sees urology Dr Bernardo Heater at times for renal cyst and also once for her UTI.  Urinary Tract Infection: Patient complains of burning with urination . She has had symptoms for 2 days. Patient also complains of back pain. Patient denies fever. Patient does have a history of recurrent UTI.  Patient does not have a history of pyelonephritis.   PMHx: She  has a past medical history of Actinic keratosis (03/15/2021), GERD (gastroesophageal reflux disease), Hyperlipidemia, Hypertension, Plantar fasciitis, and Sleep apnea. Also,  has a past surgical history that includes Colonoscopy with propofol (N/A, 12/12/2015); Abdominal hysterectomy; and Hand surgery (2005)., family history includes Breast cancer in her cousin; Cancer in her paternal grandfather; Heart disease in her paternal grandmother; Stroke in her brother, mother, and sister.,  reports that she has never smoked. She has never used smokeless tobacco. She reports current alcohol use. She reports that she does not use drugs.  She has a current medication list which includes the following prescription(s): amlodipine, aspirin ec, clobetasol, multivitamin, omeprazole, and sulfamethoxazole-trimethoprim. Also, is allergic to iodinated diagnostic agents and iodine.  Review of Systems  Constitutional: Negative for chills, fever and malaise/fatigue.  HENT: Negative for congestion, sinus pain and sore throat.   Eyes: Negative for blurred vision and pain.  Respiratory: Negative for cough and wheezing.   Cardiovascular: Negative for chest pain and leg swelling.  Gastrointestinal: Negative for abdominal pain, constipation, diarrhea, heartburn, nausea and  vomiting.  Genitourinary: Positive for dysuria and frequency. Negative for hematuria and urgency.  Musculoskeletal: Positive for joint pain. Negative for back pain, myalgias and neck pain.  Skin: Negative for itching and rash.  Neurological: Negative for dizziness, tremors and weakness.  Endo/Heme/Allergies: Does not bruise/bleed easily.  Psychiatric/Behavioral: Negative for depression. The patient is not nervous/anxious and does not have insomnia.     Objective: BP 130/80   Ht 4\' 8"  (1.422 m)   Wt 137 lb (62.1 kg)   BMI 30.71 kg/m  Physical Exam Constitutional:      General: She is not in acute distress.    Appearance: She is well-developed.  Genitourinary:     Bladder normal.     Genitourinary Comments: Cuff intact/ no lesions  Absent uterus and cervix     Vaginal cuff intact.    No vaginal erythema or bleeding.     Posterior vaginal prolapse present.    No vaginal atrophy present.    Bladder exam comments: G1 cystocele.     Pelvic exam was performed with patient in the lithotomy position.  Rectum:     Rectal exam comments: Gr 2 rectocele.    HENT:     Head: Normocephalic and atraumatic.     Nose: Nose normal.  Abdominal:     General: There is no distension.     Palpations: Abdomen is soft.     Tenderness: There is no abdominal tenderness.  Musculoskeletal:        General: Normal range of motion.  Neurological:     Mental Status: She is alert and oriented to person, place, and time.     Cranial Nerves: No cranial nerve deficit.  Skin:    General: Skin is warm and dry.  Psychiatric:        Attention and Perception: Attention normal.        Mood and Affect: Mood normal.        Speech: Speech normal.        Behavior: Behavior normal.        Cognition and Memory: Cognition normal.        Judgment: Judgment normal.     ASSESSMENT/PLAN:   Acute cystitis  Problem List Items Addressed This Visit     Pelvic pressure in female    -  Primary   Relevant Orders    POCT urinalysis dipstick (Completed)   Acute cystitis without hematuria        Treat w Bactrim May need further urology work up for recurrent UTI  No s/sx GYN concerns.  Cystocele mild and rectocele asymptomatic, so no need for pessary or surgery.  Barnett Applebaum, MD, [facility], [facility] 04/24/2021  9:01 AM

## 2021-04-24 NOTE — Addendum Note (Signed)
Addended by: Gae Dry on: 04/24/2021 09:17 AM   Modules accepted: Orders

## 2021-04-26 LAB — URINE CULTURE

## 2021-05-03 ENCOUNTER — Other Ambulatory Visit: Payer: Medicare HMO

## 2021-05-15 ENCOUNTER — Ambulatory Visit (INDEPENDENT_AMBULATORY_CARE_PROVIDER_SITE_OTHER): Payer: Medicare HMO | Admitting: Physician Assistant

## 2021-05-15 ENCOUNTER — Other Ambulatory Visit: Payer: Self-pay

## 2021-05-15 ENCOUNTER — Encounter: Payer: Self-pay | Admitting: Physician Assistant

## 2021-05-15 DIAGNOSIS — I10 Essential (primary) hypertension: Secondary | ICD-10-CM | POA: Diagnosis not present

## 2021-05-15 DIAGNOSIS — R102 Pelvic and perineal pain unspecified side: Secondary | ICD-10-CM

## 2021-05-15 DIAGNOSIS — R928 Other abnormal and inconclusive findings on diagnostic imaging of breast: Secondary | ICD-10-CM

## 2021-05-15 DIAGNOSIS — N2889 Other specified disorders of kidney and ureter: Secondary | ICD-10-CM

## 2021-05-15 DIAGNOSIS — R300 Dysuria: Secondary | ICD-10-CM | POA: Diagnosis not present

## 2021-05-15 LAB — POCT URINALYSIS DIPSTICK
Bilirubin, UA: NEGATIVE
Blood, UA: NEGATIVE
Glucose, UA: NEGATIVE
Ketones, UA: NEGATIVE
Nitrite, UA: NEGATIVE
Protein, UA: NEGATIVE
Spec Grav, UA: <=1.005 — AB (ref 1.010–1.025)
Urobilinogen, UA: 0.2 E.U./dL
pH, UA: 7 (ref 5.0–8.0)

## 2021-05-15 NOTE — Progress Notes (Signed)
[facility] [place], [place] [address]  Internal MEDICINE  Office Visit Note  Patient Name: Cynthia Keith  [id number]  [id number]  Date of Service: 05/16/2021  Chief Complaint  Patient presents with  . Gastroesophageal Reflux  . Hypertension  . Hyperlipidemia  . Follow-up    [place] results     HPI Pt is here for routine follow up and to discuss [place] results -occasional pain under right ribs. Followed by Dr. Tasia Catchings for hemachromatosis and sees them again in July and they will do an updated [place].  -Went to Dr. Bernardo Heater for follow up on renal mass which is stable. Had another UTI and was given antibiotics again. -Pelvic pain evaluated by [facility], prolapse stable. Had UTI and treated again and since then resolved. Was told by OBGYN she had to go back to Dr. Bernardo Heater if UTI persisted, but has had no complaints since then. -[place] of right breast done showing stable probable benign cluster of cysts and a repeat bilateral diagnostic mammogram and right breast US will be done in Sept 2022.  Current Medication: Outpatient Encounter Medications as of 05/15/2021  Medication Sig  . amLODipine (NORVASC) 10 MG tablet Take 1 tablet (10 mg total) by mouth daily.  Marland Kitchen aspirin EC 81 MG tablet Take 81 mg by mouth daily.  . clobetasol (TEMOVATE) 0.05 % external solution Patient to mix in jar of CeraVe cream. Apply 1-2 times a day as needed for itchy rash. Avoid face, groin, underarms.  . Multiple Vitamin (MULTIVITAMIN) tablet Take 1 tablet by mouth daily.  Marland Kitchen omeprazole (PRILOSEC) 20 MG capsule Take 20 mg by mouth daily.   No facility-administered encounter medications on file as of 05/15/2021.    Surgical History: Past Surgical History:  Procedure Laterality Date  . ABDOMINAL HYSTERECTOMY    . COLONOSCOPY WITH PROPOFOL N/A 12/12/2015   Procedure: COLONOSCOPY WITH PROPOFOL;  Surgeon: Manya Silvas, MD;  Location: [facility] ENDOSCOPY;  Service: Endoscopy;  Laterality: N/A;  . HAND SURGERY   2005    Medical History: Past Medical History:  Diagnosis Date  . Actinic keratosis 03/15/2021   L post thigh   . GERD (gastroesophageal reflux disease)   . Hyperlipidemia   . Hypertension   . Plantar fasciitis   . Sleep apnea     Family History: Family History  Problem Relation Age of Onset  . Breast cancer Cousin   . Stroke Mother   . Heart disease Paternal Grandmother   . Cancer Paternal Grandfather   . Stroke Sister   . Stroke Brother     Social History   Socioeconomic History  . Marital status: Married    Spouse name: Not on file  . Number of children: Not on file  . Years of education: Not on file  . Highest education level: Not on file  Occupational History  . Not on file  Tobacco Use  . Smoking status: Never Smoker  . Smokeless tobacco: Never Used  Vaping Use  . Vaping Use: Never used  Substance and Sexual Activity  . Alcohol use: Yes    Comment: ocassionally  . Drug use: No  . Sexual activity: Yes    Birth control/protection: Post-menopausal, Surgical  Other Topics Concern  . Not on file  Social History Narrative  . Not on file   Social Determinants of Health   Financial Resource Strain: Not on file  Food Insecurity: Not on file  Transportation Needs: Not on file  Physical Activity: Not on  file  Stress: Not on file  Social Connections: Not on file  Intimate Partner Violence: Not on file      Review of Systems  Constitutional: Negative for chills, fatigue and unexpected weight change.  HENT: Negative for congestion, postnasal drip, rhinorrhea, sneezing and sore throat.   Eyes: Negative for redness.  Respiratory: Negative for cough, chest tightness and shortness of breath.   Cardiovascular: Negative for chest pain and palpitations.  Gastrointestinal: Positive for abdominal pain. Negative for constipation, diarrhea, nausea and vomiting.  Genitourinary: Negative for dysuria and frequency.  Musculoskeletal: Positive for arthralgias.  Negative for back pain, joint swelling and neck pain.  Skin: Negative for rash.  Neurological: Negative.  Negative for tremors and numbness.  Hematological: Negative for adenopathy. Does not bruise/bleed easily.  Psychiatric/Behavioral: Negative for behavioral problems (Depression), sleep disturbance and suicidal ideas. The patient is not nervous/anxious.     Vital Signs: BP (!) 142/82   Pulse 61   Temp 98.6 F (37 C)   Resp 16   Ht 4\' 8"  (1.422 m)   Wt 135 lb 9.6 oz (61.5 kg)   SpO2 99%   BMI 30.40 kg/m    Physical Exam Vitals and nursing note reviewed.  Constitutional:      General: She is not in acute distress.    Appearance: She is well-developed. She is obese. She is not diaphoretic.  HENT:     Head: Normocephalic and atraumatic.     Mouth/Throat:     Pharynx: No oropharyngeal exudate.  Eyes:     Pupils: Pupils are equal, round, and reactive to light.  Neck:     Thyroid: No thyromegaly.     Vascular: No JVD.     Trachea: No tracheal deviation.  Cardiovascular:     Rate and Rhythm: Normal rate and regular rhythm.     Heart sounds: Normal heart sounds. No murmur heard. No friction rub. No gallop.   Pulmonary:     Effort: Pulmonary effort is normal. No respiratory distress.     Breath sounds: No wheezing or rales.  Chest:     Chest wall: No tenderness.  Abdominal:     General: Bowel sounds are normal.     Palpations: Abdomen is soft.  Musculoskeletal:        General: Normal range of motion.     Cervical back: Normal range of motion and neck supple.  Lymphadenopathy:     Cervical: No cervical adenopathy.  Skin:    General: Skin is warm and dry.  Neurological:     Mental Status: She is alert and oriented to person, place, and time.     Cranial Nerves: No cranial nerve deficit.  Psychiatric:        Behavior: Behavior normal.        Thought Content: Thought content normal.        Judgment: Judgment normal.        Assessment/Plan: 1. Abnormal mammogram  of right breast Stable cluster of cysts--plan for repeat diagnostic mammogram with R breast US in Sept 2022  2. Hemochromatosis, unspecified hemochromatosis type Followed by Dr. Tasia Catchings, plan for repeat US in July  3. Essential hypertension Stable, continue current therapy  4. Pelvic pain Went to OBGYN and was told prolapse is stable and no longer having pain.  5. Renal mass, right Followed by urology, stable on recent US  6. Dysuria Will retest urine today--will need to follow up with urology if continued infection due to recurrent UTIs treated with  multiple courses of antibiotics - POCT Urinalysis Dipstick - CULTURE, URINE COMPREHENSIVE   General Counseling: Caelen verbalizes understanding of the findings of todays visit and agrees with plan of treatment. I have discussed any further diagnostic evaluation that may be needed or ordered today. We also reviewed her medications today. she has been encouraged to call the office with any questions or concerns that should arise related to todays visit.    Orders Placed This Encounter  Procedures  . CULTURE, URINE COMPREHENSIVE  . POCT Urinalysis Dipstick    No orders of the defined types were placed in this encounter.   This patient was seen by Drema Dallas, PA-C in collaboration with Dr. Clayborn Bigness as a part of collaborative care agreement.   Total time spent:30 Minutes Time spent includes review of chart, medications, test results, and follow up plan with the patient.      Dr Lavera Guise Internal medicine

## 2021-05-17 ENCOUNTER — Telehealth: Payer: Self-pay

## 2021-05-17 NOTE — Telephone Encounter (Signed)
Called and informed pt that something did show on her labs that she may have a UTI but we are waiting on the culture to get back, informed pt to call her urologist and see if they can treat her and maybe have to do a prophylactic abx for her.

## 2021-05-19 LAB — CULTURE, URINE COMPREHENSIVE

## 2021-05-26 ENCOUNTER — Telehealth: Payer: Self-pay

## 2021-05-26 ENCOUNTER — Other Ambulatory Visit: Payer: Self-pay

## 2021-05-26 MED ORDER — ONDANSETRON HCL 4 MG PO TABS: 4.0000 mg | ORAL_TABLET | Freq: Three times a day (TID) | ORAL | 0 refills | Status: DC | PRN

## 2021-05-26 NOTE — Telephone Encounter (Signed)
Pt called that having nausea and dizziness her BP is 142/92 as per lauren that her urine culture is good no infection advised her drink plenty of water and if symptoms worse like chest pain,blurry vision or dizziness worse go to ED and also closely monitor bp

## 2021-05-29 DIAGNOSIS — Z6830 Body mass index (BMI) 30.0-30.9, adult: Secondary | ICD-10-CM | POA: Diagnosis not present

## 2021-05-29 DIAGNOSIS — E669 Obesity, unspecified: Secondary | ICD-10-CM | POA: Diagnosis not present

## 2021-05-29 DIAGNOSIS — Z91041 Radiographic dye allergy status: Secondary | ICD-10-CM | POA: Diagnosis not present

## 2021-05-29 DIAGNOSIS — Z87892 Personal history of anaphylaxis: Secondary | ICD-10-CM | POA: Diagnosis not present

## 2021-05-29 DIAGNOSIS — I10 Essential (primary) hypertension: Secondary | ICD-10-CM | POA: Diagnosis not present

## 2021-05-29 DIAGNOSIS — L309 Dermatitis, unspecified: Secondary | ICD-10-CM | POA: Diagnosis not present

## 2021-05-29 DIAGNOSIS — Z7982 Long term (current) use of aspirin: Secondary | ICD-10-CM | POA: Diagnosis not present

## 2021-05-29 DIAGNOSIS — K219 Gastro-esophageal reflux disease without esophagitis: Secondary | ICD-10-CM | POA: Diagnosis not present

## 2021-06-01 ENCOUNTER — Encounter: Payer: Self-pay | Admitting: Urology

## 2021-06-01 ENCOUNTER — Ambulatory Visit: Payer: Medicare HMO | Admitting: Urology

## 2021-06-01 ENCOUNTER — Other Ambulatory Visit: Payer: Self-pay

## 2021-06-01 VITALS — BP 140/83 | HR 83 | Ht <= 58 in | Wt 133.0 lb

## 2021-06-01 DIAGNOSIS — R300 Dysuria: Secondary | ICD-10-CM | POA: Diagnosis not present

## 2021-06-01 DIAGNOSIS — N390 Urinary tract infection, site not specified: Secondary | ICD-10-CM | POA: Diagnosis not present

## 2021-06-01 LAB — URINALYSIS, COMPLETE
Bilirubin, UA: NEGATIVE
Glucose, UA: NEGATIVE
Ketones, UA: NEGATIVE
Nitrite, UA: NEGATIVE
Protein,UA: NEGATIVE
RBC, UA: NEGATIVE
Specific Gravity, UA: 1.01 (ref 1.005–1.030)
Urobilinogen, Ur: 0.2 mg/dL (ref 0.2–1.0)
pH, UA: 6.5 (ref 5.0–7.5)

## 2021-06-01 LAB — MICROSCOPIC EXAMINATION

## 2021-06-01 NOTE — Patient Instructions (Signed)
Purchase over the counter D-Mannose and cranberry tablets.

## 2021-06-01 NOTE — Progress Notes (Signed)
06/01/2021 1:55 PM   Cynthia Keith 20-Jul-1946 [id number]  Referring provider: Lavera Guise, MD [address] [place],  [place] [address]  Chief Complaint  Patient presents with   Recurrent UTI    HPI: 75 y.o. female with history of recurrent lower tract UTIs states she was advised to follow-up here by her gynecologist.  Followed annually for a 2 cm enhancing right renal mass noted in 2009 which has been stable Last seen February 2022 This year has noted increased UTIs with symptoms of dysuria, frequency and urgency Since 01/2021 has had 2 positive cultures for beta-hemolytic strep Seen by Dr. Kenton Kingfisher for dysuria however urine culture grew mixed flora and a culture ordered by her PCP 05/15/2021 also grew mixed flora Presently asymptomatic Denies gross hematuria   PMH: Past Medical History:  Diagnosis Date   Actinic keratosis 03/15/2021   L post thigh    GERD (gastroesophageal reflux disease)    Hyperlipidemia    Hypertension    Plantar fasciitis    Sleep apnea     Surgical History: Past Surgical History:  Procedure Laterality Date   ABDOMINAL HYSTERECTOMY     COLONOSCOPY WITH PROPOFOL N/A 12/12/2015   Procedure: COLONOSCOPY WITH PROPOFOL;  Surgeon: Manya Silvas, MD;  Location: [facility];  Service: Endoscopy;  Laterality: N/A;   HAND SURGERY  2005    Home Medications:  Allergies as of 06/01/2021       Reactions   Iodinated Diagnostic Agents Anaphylaxis   Remote hx of respiratory arrest after IV iodine for kidney study. Immediately treated w/o sequela. No previous or subsequent IVP exposure. Remote hx of respiratory arrest after IV iodine for kidney study. Immediately treated w/o sequela. No previous or subsequent IVP exposure. Remote hx of respiratory arrest after IV iodine for kidney study. Immediately treated w/o sequela. No previous or subsequent IVP exposure.   Iodine Shortness Of Breath   "Quit Breathing"        Medication List         Accurate as of June 01, 2021  1:55 PM. If you have any questions, ask your nurse or doctor.          amLODipine 10 MG tablet Commonly known as: NORVASC Take 1 tablet (10 mg total) by mouth daily.   aspirin EC 81 MG tablet Take 81 mg by mouth daily.   clobetasol 0.05 % external solution Commonly known as: TEMOVATE Patient to mix in jar of CeraVe cream. Apply 1-2 times a day as needed for itchy rash. Avoid face, groin, underarms.   multivitamin tablet Take 1 tablet by mouth daily.   omeprazole 20 MG capsule Commonly known as: PRILOSEC Take 20 mg by mouth daily.   ondansetron 4 MG tablet Commonly known as: ZOFRAN Take 1 tablet (4 mg total) by mouth every 8 (eight) hours as needed for nausea.        Allergies:  Allergies  Allergen Reactions   Iodinated Diagnostic Agents Anaphylaxis    Remote hx of respiratory arrest after IV iodine for kidney study. Immediately treated w/o sequela. No previous or subsequent IVP exposure. Remote hx of respiratory arrest after IV iodine for kidney study. Immediately treated w/o sequela. No previous or subsequent IVP exposure. Remote hx of respiratory arrest after IV iodine for kidney study. Immediately treated w/o sequela. No previous or subsequent IVP exposure.   Iodine Shortness Of Breath    "Quit Breathing"    Family History: Family History  Problem Relation Age of Onset  Breast cancer Cousin    Stroke Mother    Heart disease Paternal Grandmother    Cancer Paternal Grandfather    Stroke Sister    Stroke Brother     Social History:  reports that she has never smoked. She has never used smokeless tobacco. She reports current alcohol use. She reports that she does not use drugs.   Physical Exam: There were no vitals taken for this visit.  Constitutional:  Alert and oriented, No acute distress. HEENT: [place] AT, moist mucus membranes.  Trachea midline, no masses. Cardiovascular: No clubbing, cyanosis, or edema. Respiratory: Normal  respiratory effort, no increased work of breathing.   Laboratory Data:  Urinalysis Microscopy 6-10 WBC  Assessment & Plan:    1.  Recurrent UTI Recurrent lower tract UTI Last 2 urine cultures have been negative Presently asymptomatic Urinalysis today with mild pyuria and urine culture was ordered We again discussed that urinary tract infections are quite common in postmenopausal women due to the urethral location and the protective effect of estrogen on the vaginal mucosa We discussed supplements of D-mannose and cranberry which may help prevent infections She will keep her scheduled follow-up in February instructed to return earlier for any recurrent infections    Abbie Sons, MD  [facility] [address], [place], [place] [address] [phone]

## 2021-06-04 LAB — CULTURE, URINE COMPREHENSIVE

## 2021-06-05 ENCOUNTER — Telehealth: Payer: Self-pay | Admitting: *Deleted

## 2021-06-05 DIAGNOSIS — Z23 Encounter for immunization: Secondary | ICD-10-CM | POA: Diagnosis not present

## 2021-06-05 NOTE — Telephone Encounter (Signed)
Notified patient as instructed, patient pleased. She is not having any  symptoms.

## 2021-06-05 NOTE — Telephone Encounter (Signed)
-----   Message from Abbie Sons, MD sent at 06/05/2021  7:16 AM EDT ----- Urine culture grew a low level of antibiotic.  Would only treat if she is having symptoms

## 2021-06-12 NOTE — Telephone Encounter (Signed)
burning during urination and feels tired. Patient coming in 06/13/2021

## 2021-06-12 NOTE — Telephone Encounter (Signed)
Patient called the office this morning.  She feels like she has UTI.  She is currently experiencing burning during urination and feels tired.   Please contact patient to advise.  [phone].

## 2021-06-13 ENCOUNTER — Encounter: Payer: Self-pay | Admitting: Physician Assistant

## 2021-06-13 ENCOUNTER — Other Ambulatory Visit: Payer: Self-pay

## 2021-06-13 ENCOUNTER — Ambulatory Visit (INDEPENDENT_AMBULATORY_CARE_PROVIDER_SITE_OTHER): Payer: Medicare HMO | Admitting: Physician Assistant

## 2021-06-13 VITALS — BP 170/81 | HR 67 | Ht <= 58 in | Wt 133.0 lb

## 2021-06-13 DIAGNOSIS — N3281 Overactive bladder: Secondary | ICD-10-CM

## 2021-06-13 DIAGNOSIS — N644 Mastodynia: Secondary | ICD-10-CM | POA: Diagnosis not present

## 2021-06-13 DIAGNOSIS — R300 Dysuria: Secondary | ICD-10-CM | POA: Diagnosis not present

## 2021-06-13 LAB — URINALYSIS, COMPLETE
Bilirubin, UA: NEGATIVE
Glucose, UA: NEGATIVE
Ketones, UA: NEGATIVE
Nitrite, UA: NEGATIVE
Protein,UA: NEGATIVE
RBC, UA: NEGATIVE
Specific Gravity, UA: 1.01 (ref 1.005–1.030)
Urobilinogen, Ur: 0.2 mg/dL (ref 0.2–1.0)
pH, UA: 5.5 (ref 5.0–7.5)

## 2021-06-13 LAB — MICROSCOPIC EXAMINATION: Bacteria, UA: NONE SEEN

## 2021-06-13 LAB — BLADDER SCAN AMB NON-IMAGING

## 2021-06-13 MED ORDER — MIRABEGRON ER 25 MG PO TB24: 25.0000 mg | ORAL_TABLET | Freq: Every day | ORAL | 0 refills | Status: DC

## 2021-06-13 MED ORDER — AMOXICILLIN-POT CLAVULANATE 875-125 MG PO TABS: 1.0000 | ORAL_TABLET | Freq: Two times a day (BID) | ORAL | 0 refills | Status: AC

## 2021-06-13 NOTE — Progress Notes (Signed)
06/13/2021 1:17 PM   Cynthia Keith [birth year] [id number]  CC: Chief Complaint  Patient presents with   Dysuria   HPI: Cynthia Keith is a 75 y.o. female with PMH pelvic organ prolapse s/p robotic colpopexy, cystourethroscopy, and bladder sling with Dr. Salome Holmes at [facility] on 01/07/2015 and recurrent UTI who presents today for evaluation of possible UTI.   Today she reports bothersome external burning on the labia where her urine is hitting it.  She was seen in clinic most recently by Dr. Bernardo Heater on 06/01/2021, at which point she was asymptomatic of infection, however her urine culture grew low colony counts of both E. coli and group B beta-hemolytic Streptococcus.  She additionally reports baseline urinary frequency every 30 to 45 minutes, nocturia times 2+, strong urgency, and occasional urge incontinence.  She has not tried medication for this.  She has been taking cranberry supplements as previously recommended by Dr. Bernardo Heater.  She denies a personal history of breast or ovarian cancer, however she states she is up-to-date on mammograms and they have been watching a spot on her right breast, which she has noticed has been increasingly tender over the past 2 weeks.  In-office UA today positive for 1+ leukocyte esterase; urine microscopy with 6-10 WBCs/HPF.  PVR 11 mL.  PMH: Past Medical History:  Diagnosis Date   Actinic keratosis 03/15/2021   L post thigh    GERD (gastroesophageal reflux disease)    Hyperlipidemia    Hypertension    Plantar fasciitis    Sleep apnea     Surgical History: Past Surgical History:  Procedure Laterality Date   ABDOMINAL HYSTERECTOMY     COLONOSCOPY WITH PROPOFOL N/A 12/12/2015   Procedure: COLONOSCOPY WITH PROPOFOL;  Surgeon: Manya Silvas, MD;  Location: [facility];  Service: Endoscopy;  Laterality: N/A;   HAND SURGERY  2005    Home Medications:  Allergies as of 06/13/2021       Reactions   Iodinated Diagnostic Agents Anaphylaxis    Remote hx of respiratory arrest after IV iodine for kidney study. Immediately treated w/o sequela. No previous or subsequent IVP exposure. Remote hx of respiratory arrest after IV iodine for kidney study. Immediately treated w/o sequela. No previous or subsequent IVP exposure. Remote hx of respiratory arrest after IV iodine for kidney study. Immediately treated w/o sequela. No previous or subsequent IVP exposure.   Iodine Shortness Of Breath   "Quit Breathing"        Medication List        Accurate as of June 13, 2021  1:17 PM. If you have any questions, ask your nurse or doctor.          STOP taking these medications    ondansetron 4 MG tablet Commonly known as: ZOFRAN Stopped by: Debroah Loop, PA-C       TAKE these medications    amLODipine 10 MG tablet Commonly known as: NORVASC Take 1 tablet (10 mg total) by mouth daily.   aspirin EC 81 MG tablet Take 81 mg by mouth daily.   clobetasol 0.05 % external solution Commonly known as: TEMOVATE Patient to mix in jar of CeraVe cream. Apply 1-2 times a day as needed for itchy rash. Avoid face, groin, underarms.   multivitamin tablet Take 1 tablet by mouth daily.   omeprazole 20 MG capsule Commonly known as: PRILOSEC Take 20 mg by mouth daily.        Allergies:  Allergies  Allergen Reactions   Iodinated Diagnostic Agents  Anaphylaxis    Remote hx of respiratory arrest after IV iodine for kidney study. Immediately treated w/o sequela. No previous or subsequent IVP exposure. Remote hx of respiratory arrest after IV iodine for kidney study. Immediately treated w/o sequela. No previous or subsequent IVP exposure. Remote hx of respiratory arrest after IV iodine for kidney study. Immediately treated w/o sequela. No previous or subsequent IVP exposure.   Iodine Shortness Of Breath    "Quit Breathing"    Family History: Family History  Problem Relation Age of Onset   Breast cancer Cousin    Stroke Mother     Heart disease Paternal Grandmother    Cancer Paternal Grandfather    Stroke Sister    Stroke Brother     Social History:   reports that she has never smoked. She has never used smokeless tobacco. She reports current alcohol use. She reports that she does not use drugs.  Physical Exam: BP (!) 170/81   Pulse 67   Ht 4\' 8"  (1.422 m)   Wt 133 lb (60.3 kg)   BMI 29.82 kg/m   Constitutional:  Alert and oriented, no acute distress, nontoxic appearing HEENT: [place], AT Cardiovascular: No clubbing, cyanosis, or edema Respiratory: Normal respiratory effort, no increased work of breathing Skin: No rashes, bruises or suspicious lesions Neurologic: Grossly intact, no focal deficits, moving all 4 extremities Psychiatric: Normal mood and affect  Laboratory Data: Results for orders placed or performed in visit on 06/13/21  Microscopic Examination   Urine  Result Value Ref Range   WBC, UA 6-10 (A) 0 - 5 /hpf   RBC 0-2 0 - 2 /hpf   Epithelial Cells (non renal) 0-10 0 - 10 /hpf   Bacteria, UA None seen None seen/Few  Urinalysis, Complete  Result Value Ref Range   Specific Gravity, UA 1.010 1.005 - 1.030   pH, UA 5.5 5.0 - 7.5   Color, UA Yellow Yellow   Appearance Ur Hazy (A) Clear   Leukocytes,UA 1+ (A) Negative   Protein,UA Negative Negative/Trace   Glucose, UA Negative Negative   Ketones, UA Negative Negative   RBC, UA Negative Negative   Bilirubin, UA Negative Negative   Urobilinogen, Ur 0.2 0.2 - 1.0 mg/dL   Nitrite, UA Negative Negative   Microscopic Examination See below:   BLADDER SCAN AMB NON-IMAGING  Result Value Ref Range   Scan Result 62mL    Assessment & Plan:   1. Dysuria Sounds more like vulvar burning than true dysuria, however given recently positive urine culture, will treat with culture appropriate Augmentin to rule out any underlying infective process.  Recommend consideration of topical vaginal estrogen in the future, though will defer this today given her  reports of new breast tenderness as below. - Urinalysis, Complete - CULTURE, URINE COMPREHENSIVE - amoxicillin-clavulanate (AUGMENTIN) 875-125 MG tablet; Take 1 tablet by mouth 2 (two) times daily for 5 days.  Dispense: 10 tablet; Refill: 0  2. Overactive bladder Notable for significant baseline frequency, urgency, and occasional urge incontinence.  I offered her a trial of Myrbetriq 25 mg daily and she accepted.  Patient is emptying appropriately at baseline.  We will plan for symptom recheck and PVR in 4 weeks. - mirabegron ER (MYRBETRIQ) 25 MG TB24 tablet; Take 1 tablet (25 mg total) by mouth daily.  Dispense: 28 tablet; Refill: 0 - BLADDER SCAN AMB NON-IMAGING  3. Breast tenderness Counseled patient to follow-up with her primary care regarding new breast changes.  She expressed understanding.  Return  in about 4 weeks (around 07/11/2021) for Symptom recheck with PVR.  Debroah Loop, PA-C  [facility] [address] [place], [place] [address] [phone]

## 2021-06-15 LAB — CULTURE, URINE COMPREHENSIVE

## 2021-06-16 ENCOUNTER — Telehealth: Payer: Self-pay

## 2021-06-16 NOTE — Telephone Encounter (Signed)
Notified patient as advised, patient confirmed understanding of stopping ABX. Confirmed 1 month f/u.

## 2021-06-16 NOTE — Telephone Encounter (Signed)
-----   Message from Debroah Loop, [place] sent at 06/15/2021  5:16 PM EDT ----- Ucx negative, ok to stop abx. ----- Message ----- From: Interface, [facility] Lab Results In Sent: 06/13/2021   4:37 PM EDT To: Debroah Loop, PA-C

## 2021-06-19 ENCOUNTER — Ambulatory Visit (INDEPENDENT_AMBULATORY_CARE_PROVIDER_SITE_OTHER): Payer: Medicare HMO | Admitting: Physician Assistant

## 2021-06-19 ENCOUNTER — Other Ambulatory Visit: Payer: Self-pay

## 2021-06-19 DIAGNOSIS — R928 Other abnormal and inconclusive findings on diagnostic imaging of breast: Secondary | ICD-10-CM | POA: Diagnosis not present

## 2021-06-19 DIAGNOSIS — N644 Mastodynia: Secondary | ICD-10-CM

## 2021-06-19 DIAGNOSIS — I10 Essential (primary) hypertension: Secondary | ICD-10-CM | POA: Diagnosis not present

## 2021-06-19 MED ORDER — HYDROCHLOROTHIAZIDE 12.5 MG PO TABS: 12.5000 mg | ORAL_TABLET | Freq: Every day | ORAL | 3 refills | Status: DC

## 2021-06-19 NOTE — Progress Notes (Signed)
[facility] [place], [place] [address]  Internal MEDICINE  Office Visit Note  Patient Name: Cynthia Keith  [id number]  [id number]  Date of Service: 06/28/2021  Chief Complaint  Patient presents with   Acute Visit    Dark spots on face, has appt with dermatologist on Wednesday, right breast pain, nipple area to inside of breast, tender to touch    Quality Metric Gaps    Shingrix      HPI Pt is here for a sick visit -She has been having right breast pain for the last few days. States it is tender to the touch along the upper inside portion of right breast. Denies any burning, tingling or skin changes. She did recently (April) have an [place] of her right breast following likely benign cluster of cysts at 8:30 position. She is supposed to have her diagnostic mammogram and a follow up US in Sept. She was mainly worried because she wasn't sure which area the cysts were in that are being monitored, but it does not seem to be the same location of her current tenderness. -Dr. Maryclare Labrador office put her on ABX for UTI, but cx was clear so hasnt taken any the past few days since last Friday. Otherwise no medication changes. -Sees Dr. Tasia Catchings for follow up on liver with plan for [place] in July-- has had some tenderness along epigastric region, often after eating and is going to monitor this and discuss at visit. -Dark spots on face for past week or so and reports no changes in medications, topicals/creams on face, or recent sun exposure. She has a dermatology appt on Wednesday anyways for a different place on her face she wants removed and plans to discuss discoloration with their office.  Current Medication:  Outpatient Encounter Medications as of 06/19/2021  Medication Sig   amLODipine (NORVASC) 10 MG tablet Take 1 tablet (10 mg total) by mouth daily.   aspirin EC 81 MG tablet Take 81 mg by mouth daily.   clobetasol (TEMOVATE) 0.05 % external solution Patient to mix in jar of CeraVe  cream. Apply 1-2 times a day as needed for itchy rash. Avoid face, groin, underarms.   hydrochlorothiazide (HYDRODIURIL) 12.5 MG tablet Take 1 tablet (12.5 mg total) by mouth daily.   mirabegron ER (MYRBETRIQ) 25 MG TB24 tablet Take 1 tablet (25 mg total) by mouth daily.   Multiple Vitamin (MULTIVITAMIN) tablet Take 1 tablet by mouth daily.   omeprazole (PRILOSEC) 20 MG capsule Take 20 mg by mouth daily.   No facility-administered encounter medications on file as of 06/19/2021.      Medical History: Past Medical History:  Diagnosis Date   Actinic keratosis 03/15/2021   L post thigh    GERD (gastroesophageal reflux disease)    Hyperlipidemia    Hypertension    Plantar fasciitis    Sleep apnea      Vital Signs: BP (!) 164/78   Pulse 65   Temp (!) 97.1 F (36.2 C)   Resp 16   Ht 4\' 8"  (1.422 m)   Wt 136 lb 12.8 oz (62.1 kg)   SpO2 98%   BMI 30.67 kg/m    Review of Systems  Constitutional:  Negative for fatigue and fever.  HENT:  Negative for congestion, mouth sores and postnasal drip.   Respiratory:  Negative for cough.   Cardiovascular:  Negative for chest pain.  Genitourinary:  Negative for flank pain.  Musculoskeletal:  Right breast tenderness on upper inner portion  Skin:  Positive for color change.       Dark spots on face  Psychiatric/Behavioral: Negative.     Physical Exam Vitals and nursing note reviewed.  Constitutional:      General: She is not in acute distress.    Appearance: She is well-developed. She is obese. She is not diaphoretic.  HENT:     Head: Normocephalic and atraumatic.     Mouth/Throat:     Pharynx: No oropharyngeal exudate.  Eyes:     Pupils: Pupils are equal, round, and reactive to light.  Neck:     Thyroid: No thyromegaly.     Vascular: No JVD.     Trachea: No tracheal deviation.  Cardiovascular:     Rate and Rhythm: Normal rate and regular rhythm.     Heart sounds: Normal heart sounds. No murmur heard.   No friction  rub. No gallop.  Pulmonary:     Effort: Pulmonary effort is normal. No respiratory distress.     Breath sounds: No wheezing or rales.  Chest:     Chest wall: No tenderness.  Breasts:    Right: Tenderness present. No mass, nipple discharge or skin change.    Abdominal:     General: Bowel sounds are normal.     Palpations: Abdomen is soft.  Musculoskeletal:        General: Normal range of motion.     Cervical back: Normal range of motion and neck supple.  Lymphadenopathy:     Cervical: No cervical adenopathy.  Skin:    General: Skin is warm and dry.     Comments: Dark macular patches along cheeks  Neurological:     Mental Status: She is alert and oriented to person, place, and time.     Cranial Nerves: No cranial nerve deficit.  Psychiatric:        Behavior: Behavior normal.        Thought Content: Thought content normal.        Judgment: Judgment normal.      Assessment/Plan: 1. Breast tenderness in female Will continue to monitor and pt will call if not improving or if any worsening or other symptoms develop. Consider repeating breast US sooner than Sept if so.  2. Abnormal mammogram of right breast Likely benign cluster of cysts in 8:30 position of right breast. Plan to repeat US and diagnostic mammogram in Sept.  3. Essential hypertension Will add HCTZ due to elevated readings recently and encouraged pt to monitor closely at home. - hydrochlorothiazide (HYDRODIURIL) 12.5 MG tablet; Take 1 tablet (12.5 mg total) by mouth daily.  Dispense: 90 tablet; Refill: 3  4. Hemochromatosis, unspecified hemochromatosis type Followed by Dr. Tasia Catchings with appt and [place] in July   General Counseling: Patsy Lager understanding of the findings of todays visit and agrees with plan of treatment. I have discussed any further diagnostic evaluation that may be needed or ordered today. We also reviewed her medications today. she has been encouraged to call the office with any questions or  concerns that should arise related to todays visit.    Counseling:    No orders of the defined types were placed in this encounter.   Meds ordered this encounter  Medications   hydrochlorothiazide (HYDRODIURIL) 12.5 MG tablet    Sig: Take 1 tablet (12.5 mg total) by mouth daily.    Dispense:  90 tablet    Refill:  3    Time spent:35 Minutes

## 2021-06-21 ENCOUNTER — Encounter: Payer: Self-pay | Admitting: Dermatology

## 2021-06-21 ENCOUNTER — Other Ambulatory Visit: Payer: Self-pay

## 2021-06-21 ENCOUNTER — Ambulatory Visit: Payer: Medicare HMO | Admitting: Dermatology

## 2021-06-21 DIAGNOSIS — D492 Neoplasm of unspecified behavior of bone, soft tissue, and skin: Secondary | ICD-10-CM

## 2021-06-21 DIAGNOSIS — C44311 Basal cell carcinoma of skin of nose: Secondary | ICD-10-CM

## 2021-06-21 DIAGNOSIS — C4491 Basal cell carcinoma of skin, unspecified: Secondary | ICD-10-CM

## 2021-06-21 DIAGNOSIS — L821 Other seborrheic keratosis: Secondary | ICD-10-CM

## 2021-06-21 HISTORY — DX: Basal cell carcinoma of skin, unspecified: C44.91

## 2021-06-21 NOTE — Progress Notes (Signed)
   Follow-Up Visit   Subjective  Cynthia Keith is a 75 y.o. female who presents for the following: Skin Problem (Patient c/o dark patches on face. Right cheek worse area. Dur: 9 days. Worsening over past 3 days. Denies itch. ) and lesion (Here for Bx of lesion at left nostril. Itching since last visit. Non tender. ).  She currently doesn't use daily sunscreen on her face, but she used to until about 3 months ago.    The following portions of the chart were reviewed this encounter and updated as appropriate:       Review of Systems: No other skin or systemic complaints except as noted in HPI or Assessment and Plan.   Objective  Well appearing patient in no apparent distress; mood and affect are within normal limits.  A focused examination was performed including head, including the scalp, face, neck, nose, ears, eyelids, and lips. Relevant physical exam findings are noted in the Assessment and Plan.  Left Alar Crease 0.4cm flesh papule  right cheek Stuck-on, waxy, tan-brown macules/papules and patches -- Discussed benign etiology and prognosis.   Assessment & Plan  Neoplasm of skin Left Alar Crease  Epidermal / dermal shaving  Lesion diameter (cm):  0.4 Informed consent: discussed and consent obtained   Patient was prepped and draped in usual sterile fashion: Area prepped with alcohol. Anesthesia: the lesion was anesthetized in a standard fashion   Anesthetic:  1% lidocaine w/ epinephrine 1-100,000 buffered w/ 8.4% NaHCO3 Instrument used: flexible razor blade   Hemostasis achieved with: pressure, aluminum chloride and electrodesiccation   Outcome: patient tolerated procedure well   Post-procedure details: wound care instructions given   Post-procedure details comment:  Ointment and small bandage applied  Specimen 1 - Surgical pathology Differential Diagnosis: Irritated nevus vs other  Check Margins: No  Seborrheic keratosis right cheek  Reassured benign age-related  growth.  Recommend observation.  Discussed cryotherapy if spot(s) become irritated or inflamed.  Recommend moisturizer with spf daily to face to minimize darkening  Return for TBSE as scheduled.  I, Emelia Salisbury, CMA, am acting as scribe for Brendolyn Patty, MD.  Documentation: I have reviewed the above documentation for accuracy and completeness, and I agree with the above.  Brendolyn Patty MD

## 2021-06-21 NOTE — Patient Instructions (Addendum)
Wound Care Instructions  Cleanse wound gently with soap and water once a day then pat dry with clean gauze. Apply a thing coat of Petrolatum (petroleum jelly, "Vaseline") over the wound (unless you have an allergy to this). We recommend that you use a new, sterile tube of Vaseline. Do not pick or remove scabs. Do not remove the yellow or white "healing tissue" from the base of the wound.  Cover the wound with fresh, clean, nonstick gauze and secure with paper tape. You may use Band-Aids in place of gauze and tape if the would is small enough, but would recommend trimming much of the tape off as there is often too much. Sometimes Band-Aids can irritate the skin.  You should call the office for your biopsy report after 1 week if you have not already been contacted.  If you experience any problems, such as abnormal amounts of bleeding, swelling, significant bruising, significant pain, or evidence of infection, please call the office immediately.  FOR ADULT SURGERY PATIENTS: If you need something for pain relief you may take 1 extra strength Tylenol (acetaminophen) AND 2 Ibuprofen (200mg  each) together every 4 hours as needed for pain. (do not take these if you are allergic to them or if you have a reason you should not take them.) Typically, you may only need pain medication for 1 to 3 days. Seborrheic Keratosis  What causes seborrheic keratoses? Seborrheic keratoses are harmless, common skin growths that first appear during adult life.  As time goes by, more growths appear.  Some people may develop a large number of them.  Seborrheic keratoses appear on both covered and uncovered body parts.  They are not caused by sunlight.  The tendency to develop seborrheic keratoses can be inherited.  They vary in color from skin-colored to gray, brown, or even black.  They can be either smooth or have a rough, warty surface.   Seborrheic keratoses are superficial and look as if they were stuck on the skin.  Under  the microscope this type of keratosis looks like layers upon layers of skin.  That is why at times the top layer may seem to fall off, but the rest of the growth remains and re-grows.    Treatment Seborrheic keratoses do not need to be treated, but can easily be removed in the office.  Seborrheic keratoses often cause symptoms when they rub on clothing or jewelry.  Lesions can be in the way of shaving.  If they become inflamed, they can cause itching, soreness, or burning.  Removal of a seborrheic keratosis can be accomplished by freezing, burning, or surgery. If any spot bleeds, scabs, or grows rapidly, please return to have it checked, as these can be an indication of a skin cancer.   Recommend daily broad spectrum sunscreen SPF 30+ to sun-exposed areas, reapply every 2 hours as needed. Call for new or changing lesions.  Staying in the shade or wearing long sleeves, sun glasses (UVA+UVB protection) and wide brim hats (4-inch brim around the entire circumference of the hat) are also recommended for sun protection.    If you have any questions or concerns for your doctor, please call our main line at [phone] and press option 4 to reach your doctor's medical assistant. If no one answers, please leave a voicemail as directed and we will return your call as soon as possible. Messages left after 4 pm will be answered the following business day.   You may also send [place] a message via  MyChart. We typically respond to MyChart messages within 1-2 business days.  For prescription refills, please ask your pharmacy to contact our office. Our fax number is [phone].  If you have an urgent issue when the clinic is closed that cannot wait until the next business day, you can page your doctor at the number below.    Please note that while we do our best to be available for urgent issues outside of office hours, we are not available 24/7.   If you have an urgent issue and are unable to reach [place], you may  choose to seek medical care at your doctor's office, retail clinic, urgent care center, or emergency room.  If you have a medical emergency, please immediately call 911 or go to the emergency department.  Pager Numbers  - Dr. Nehemiah Massed: [phone]  - Dr. Laurence Ferrari: [phone]  - Dr. Nicole Kindred: [phone]  In the event of inclement weather, please call our main line at [phone] for an update on the status of any delays or closures.  Dermatology Medication Tips: Please keep the boxes that topical medications come in in order to help keep track of the instructions about where and how to use these. Pharmacies typically print the medication instructions only on the boxes and not directly on the medication tubes.   If your medication is too expensive, please contact our office at [phone] option 4 or send [place] a message through [facility].   We are unable to tell what your co-pay for medications will be in advance as this is different depending on your insurance coverage. However, we may be able to find a substitute medication at lower cost or fill out paperwork to get insurance to cover a needed medication.   If a prior authorization is required to get your medication covered by your insurance company, please allow [place] 1-2 business days to complete this process.  Drug prices often vary depending on where the prescription is filled and some pharmacies may offer cheaper prices.  The website www.goodrx.com contains coupons for medications through different pharmacies. The prices here do not account for what the cost may be with help from insurance (it may be cheaper with your insurance), but the website can give you the price if you did not use any insurance.  - You can print the associated coupon and take it with your prescription to the pharmacy.  - You may also stop by our office during regular business hours and pick up a GoodRx coupon card.  - If you need your prescription sent  electronically to a different pharmacy, notify our office through [facility] or by phone at [phone] option 4.

## 2021-06-29 ENCOUNTER — Telehealth: Payer: Self-pay

## 2021-06-29 NOTE — Telephone Encounter (Signed)
Advised pt of bx results, and scheduled patient for [facility] on 07/31/21 at 11:30./sh

## 2021-06-29 NOTE — Telephone Encounter (Signed)
-----   Message from Brendolyn Patty, MD sent at 06/28/2021  4:25 PM EDT ----- Skin , left alar crease BASAL CELL CARCINOMA, INFUNDIBULOCYSTIC TYPE  BCC skin cancer, needs EDC - please call patient

## 2021-06-30 ENCOUNTER — Other Ambulatory Visit: Payer: Self-pay

## 2021-06-30 ENCOUNTER — Inpatient Hospital Stay: Payer: Medicare HMO

## 2021-06-30 ENCOUNTER — Inpatient Hospital Stay: Payer: Medicare HMO | Attending: Oncology

## 2021-06-30 DIAGNOSIS — R1011 Right upper quadrant pain: Secondary | ICD-10-CM | POA: Insufficient documentation

## 2021-06-30 DIAGNOSIS — K760 Fatty (change of) liver, not elsewhere classified: Secondary | ICD-10-CM | POA: Diagnosis not present

## 2021-06-30 LAB — CBC WITH DIFFERENTIAL/PLATELET
Abs Immature Granulocytes: 0.02 10*3/uL (ref 0.00–0.07)
Basophils Absolute: 0 10*3/uL (ref 0.0–0.1)
Basophils Relative: 1 %
Eosinophils Absolute: 0.2 10*3/uL (ref 0.0–0.5)
Eosinophils Relative: 3 %
HCT: 39.8 % (ref 36.0–46.0)
Hemoglobin: 14 g/dL (ref 12.0–15.0)
Immature Granulocytes: 0 %
Lymphocytes Relative: 36 %
Lymphs Abs: 2.5 10*3/uL (ref 0.7–4.0)
MCH: 32.3 pg (ref 26.0–34.0)
MCHC: 35.2 g/dL (ref 30.0–36.0)
MCV: 91.7 fL (ref 80.0–100.0)
Monocytes Absolute: 0.8 10*3/uL (ref 0.1–1.0)
Monocytes Relative: 11 %
Neutro Abs: 3.4 10*3/uL (ref 1.7–7.7)
Neutrophils Relative %: 49 %
Platelets: 233 10*3/uL (ref 150–400)
RBC: 4.34 MIL/uL (ref 3.87–5.11)
RDW: 12.4 % (ref 11.5–15.5)
WBC: 7 10*3/uL (ref 4.0–10.5)
nRBC: 0 % (ref 0.0–0.2)

## 2021-06-30 LAB — IRON AND TIBC
Iron: 114 ug/dL (ref 28–170)
Saturation Ratios: 30 % (ref 10.4–31.8)
TIBC: 381 ug/dL (ref 250–450)
UIBC: 267 ug/dL

## 2021-06-30 LAB — FERRITIN: Ferritin: 92 ng/mL (ref 11–307)

## 2021-07-03 ENCOUNTER — Encounter: Payer: Self-pay | Admitting: Oncology

## 2021-07-03 ENCOUNTER — Other Ambulatory Visit: Payer: Self-pay

## 2021-07-03 ENCOUNTER — Inpatient Hospital Stay: Payer: Medicare HMO | Admitting: Oncology

## 2021-07-03 DIAGNOSIS — K760 Fatty (change of) liver, not elsewhere classified: Secondary | ICD-10-CM | POA: Diagnosis not present

## 2021-07-03 DIAGNOSIS — R1011 Right upper quadrant pain: Secondary | ICD-10-CM | POA: Diagnosis not present

## 2021-07-03 NOTE — Progress Notes (Signed)
Hematology/Oncology follow up  note [facility] [phone] [phone]   Patient Care Team: Lavera Guise, MD as PCP - General (Internal Medicine)  REFERRING PROVIDER: Lavera Guise, MD  CHIEF COMPLAINTS/REASON FOR VISIT:  Follow-up for compound heterozygous hemochromatosis  PERTINENT HEMATOLOGY HISTORY  07/05/2020, patient had blood work done which showed decreased iron saturation 14, ferritin level was increased at 356, TIBC 315.  Patient was referred to hematology for further evaluation for possible hemochromatosis/elevated ferritin. Patient reports feeling well at baseline today. Patient drinks red wine 4-5 days per week.  Occasionally she also drinks beer when she orders pizza. Denies any recent infection, new medication. Denies any family history of hemochromatosis.   INTERVAL HISTORY Cynthia Keith is a 75 y.o. female who has above history reviewed by me today presents for follow up visit for compound heterozygous hemochromatosis mutation Problems and complaints are listed below: Patient reports chronic ongoing intermittent right upper quadrant pain after eating, duration since last year. She has had ultrasound abdomen in November 2022.  No cholecystitis at that point. Otherwise she has no new concerns or complaints.   Review of Systems  Constitutional:  Negative for appetite change, chills, fatigue and fever.  HENT:   Negative for hearing loss and voice change.   Eyes:  Negative for eye problems.  Respiratory:  Negative for chest tightness and cough.   Cardiovascular:  Negative for chest pain.  Gastrointestinal:  Negative for abdominal distention and blood in stool.       Right upper quadrant pain  Endocrine: Negative for hot flashes.  Genitourinary:  Negative for difficulty urinating and frequency.   Musculoskeletal:  Negative for arthralgias.  Skin:  Negative for itching and rash.  Neurological:  Negative for extremity  weakness.  Hematological:  Negative for adenopathy.  Psychiatric/Behavioral:  Negative for confusion.    MEDICAL HISTORY:  Past Medical History:  Diagnosis Date   Actinic keratosis 03/15/2021   L post thigh    Basal cell carcinoma 06/21/2021   L alar crease, pt sched for EDC   GERD (gastroesophageal reflux disease)    Hyperlipidemia    Hypertension    Plantar fasciitis    Sleep apnea     SURGICAL HISTORY: Past Surgical History:  Procedure Laterality Date   ABDOMINAL HYSTERECTOMY     COLONOSCOPY WITH PROPOFOL N/A 12/12/2015   Procedure: COLONOSCOPY WITH PROPOFOL;  Surgeon: Manya Silvas, MD;  Location: [facility];  Service: Endoscopy;  Laterality: N/A;   HAND SURGERY  2005    SOCIAL HISTORY: Social History   Socioeconomic History   Marital status: Married    Spouse name: Not on file   Number of children: Not on file   Years of education: Not on file   Highest education level: Not on file  Occupational History   Not on file  Tobacco Use   Smoking status: Never   Smokeless tobacco: Never  Vaping Use   Vaping Use: Never used  Substance and Sexual Activity   Alcohol use: Yes    Comment: ocassionally   Drug use: No   Sexual activity: Yes    Birth control/protection: Post-menopausal, Surgical  Other Topics Concern   Not on file  Social History Narrative   Not on file   Social Determinants of Health   Financial Resource Strain: Not on file  Food Insecurity: Not on file  Transportation Needs: Not on file  Physical Activity: Not on file  Stress: Not on file  Social  Connections: Not on file  Intimate Partner Violence: Not on file    FAMILY HISTORY: Family History  Problem Relation Age of Onset   Breast cancer Cousin    Stroke Mother    Heart disease Paternal Grandmother    Cancer Paternal Grandfather    Stroke Sister    Stroke Brother     ALLERGIES:  is allergic to iodinated diagnostic agents and iodine.  MEDICATIONS:  Current Outpatient  Medications  Medication Sig Dispense Refill   amLODipine (NORVASC) 10 MG tablet Take 1 tablet (10 mg total) by mouth daily. 90 tablet 1   aspirin EC 81 MG tablet Take 81 mg by mouth daily.     hydrochlorothiazide (HYDRODIURIL) 12.5 MG tablet Take 1 tablet (12.5 mg total) by mouth daily. 90 tablet 3   mirabegron ER (MYRBETRIQ) 25 MG TB24 tablet Take 1 tablet (25 mg total) by mouth daily. 28 tablet 0   Multiple Vitamin (MULTIVITAMIN) tablet Take 1 tablet by mouth daily.     omeprazole (PRILOSEC) 20 MG capsule Take 20 mg by mouth daily.     clobetasol (TEMOVATE) 0.05 % external solution Patient to mix in jar of CeraVe cream. Apply 1-2 times a day as needed for itchy rash. Avoid face, groin, underarms. (Patient not taking: Reported on 07/03/2021) 50 mL 1   No current facility-administered medications for this visit.     PHYSICAL EXAMINATION: ECOG PERFORMANCE STATUS: 0 - Asymptomatic Vitals:   07/03/21 1002  BP: 132/77  Pulse: 69  Temp: 97.6 F (36.4 C)   Filed Weights   07/03/21 1002  Weight: 138 lb 14.4 oz (63 kg)    Physical Exam Constitutional:      General: She is not in acute distress. HENT:     Head: Normocephalic and atraumatic.  Eyes:     General: No scleral icterus. Cardiovascular:     Rate and Rhythm: Normal rate and regular rhythm.     Heart sounds: Normal heart sounds.  Pulmonary:     Effort: Pulmonary effort is normal. No respiratory distress.     Breath sounds: No wheezing.  Abdominal:     General: Bowel sounds are normal. There is no distension.     Palpations: Abdomen is soft.  Musculoskeletal:        General: No deformity. Normal range of motion.     Cervical back: Normal range of motion and neck supple.  Skin:    General: Skin is warm and dry.     Findings: No erythema or rash.  Neurological:     Mental Status: She is alert and oriented to person, place, and time. Mental status is at baseline.     Cranial Nerves: No cranial nerve deficit.      Coordination: Coordination normal.  Psychiatric:        Mood and Affect: Mood normal.    LABORATORY DATA:  I have reviewed the data as listed Lab Results  Component Value Date   WBC 7.0 06/30/2021   HGB 14.0 06/30/2021   HCT 39.8 06/30/2021   MCV 91.7 06/30/2021   PLT 233 06/30/2021   Recent Labs    07/05/20 1137 08/15/20 1020 12/26/20 0918  PROT 7.1 8.1 7.8  ALBUMIN 4.4 4.3 4.2  AST 70* 37 33  ALT 55* 31 31  ALKPHOS 82 72 73  BILITOT 0.5 0.5 0.4  BILIDIR 0.14 0.1 <0.1  IBILI  --  0.4 NOT CALCULATED    Iron/TIBC/Ferritin/ %Sat    Component Value Date/Time  IRON 114 06/30/2021 0940   IRON 44 07/05/2020 1137   TIBC 381 06/30/2021 0940   TIBC 315 07/05/2020 1137   FERRITIN 92 06/30/2021 0940   FERRITIN 356 (H) 07/05/2020 1137   IRONPCTSAT 30 06/30/2021 0940   IRONPCTSAT 14 (L) 07/05/2020 1137      RADIOGRAPHIC STUDIES: I have personally reviewed the radiological images as listed and agreed with the findings in the report. No results found.    ASSESSMENT & PLAN:  1. Hemochromatosis associated with compound heterozygous mutation in HFE gene ([place])   2. Hepatic steatosis    #Compound heterozygous hemochromatosis mutation Patient carries 2 mutations CY282Y and H63D Ferritin is less than 500. Labs reviewed and discussed with Patient's iron saturation is 30 and ferritin is 92. Overall she has no signs of iron overload. I will hold off phlebotomy at this point. Continue monitor. Recommend patient avoid iron supplementation alcohol use  Fatty liver disease based on previous ultrasound finding. Right upper quadrant pain, intermittent ongoing chronic issue for her.  Recommend patient to contact primary care provider For further evaluation, decision of repeat ultrasound need of surgeon evaluation.  Follow-up in 6 months.  Orders Placed This Encounter  Procedures   CBC with Differential/Platelet    Standing Status:   Future    Standing Expiration Date:    07/03/2022   Ferritin    Standing Status:   Future    Standing Expiration Date:   07/03/2022   Iron and TIBC    Standing Status:   Future    Standing Expiration Date:   07/03/2022   CBC with Differential/Platelet    Standing Status:   Future    Standing Expiration Date:   07/03/2022   Ferritin    Standing Status:   Future    Standing Expiration Date:   07/03/2022   Iron and TIBC    Standing Status:   Future    Standing Expiration Date:   07/03/2022    All questions were answered. The patient knows to call the clinic with any problems questions or concerns.  cc Lavera Guise, MD    Return of visit:  6 months-lab[CBC, iron TIBC ferritin CMP] -NP 12 months-lab [CBC iron TIBC ferritin]-MD   Earlie Server, MD, PhD Hematology Oncology [facility] Pager- [phone] 07/03/2021

## 2021-07-03 NOTE — Progress Notes (Signed)
Patient has RUQ pain especially after eating.

## 2021-07-06 ENCOUNTER — Ambulatory Visit (INDEPENDENT_AMBULATORY_CARE_PROVIDER_SITE_OTHER): Payer: Medicare HMO | Admitting: Physician Assistant

## 2021-07-06 ENCOUNTER — Other Ambulatory Visit: Payer: Self-pay

## 2021-07-06 ENCOUNTER — Encounter: Payer: Self-pay | Admitting: Physician Assistant

## 2021-07-06 VITALS — BP 142/70 | HR 65 | Temp 97.2°F | Resp 16 | Ht <= 58 in | Wt 139.8 lb

## 2021-07-06 DIAGNOSIS — E7849 Other hyperlipidemia: Secondary | ICD-10-CM | POA: Diagnosis not present

## 2021-07-06 DIAGNOSIS — R5383 Other fatigue: Secondary | ICD-10-CM | POA: Diagnosis not present

## 2021-07-06 DIAGNOSIS — Z23 Encounter for immunization: Secondary | ICD-10-CM | POA: Diagnosis not present

## 2021-07-06 DIAGNOSIS — I10 Essential (primary) hypertension: Secondary | ICD-10-CM | POA: Diagnosis not present

## 2021-07-06 DIAGNOSIS — E538 Deficiency of other specified B group vitamins: Secondary | ICD-10-CM

## 2021-07-06 DIAGNOSIS — R300 Dysuria: Secondary | ICD-10-CM | POA: Diagnosis not present

## 2021-07-06 LAB — POCT URINALYSIS DIPSTICK
Bilirubin, UA: NEGATIVE
Glucose, UA: NEGATIVE
Nitrite, UA: NEGATIVE
Protein, UA: POSITIVE — AB
Spec Grav, UA: 1.01 (ref 1.010–1.025)
Urobilinogen, UA: 0.2 E.U./dL
pH, UA: 7 (ref 5.0–8.0)

## 2021-07-06 MED ORDER — NITROFURANTOIN MONOHYD MACRO 100 MG PO CAPS: ORAL_CAPSULE | ORAL | 0 refills | Status: DC

## 2021-07-06 MED ORDER — ZOSTER VAC RECOMB ADJUVANTED 50 MCG/0.5ML IM SUSR: 0.5000 mL | Freq: Once | INTRAMUSCULAR | 0 refills | Status: AC

## 2021-07-06 NOTE — Progress Notes (Signed)
[facility] [place], [place] [address]  Internal MEDICINE  Office Visit Note  Patient Name: Cynthia Keith  [id number]  [id number]  Date of Service: 07/10/2021  Chief Complaint  Patient presents with   Acute Visit    dizzy spell, headache, pale, pain right upper side    Urinary Tract Infection    Urine frequency, urgency with a full bladder,  painful urination, appt with urology next Monday, started taking antibiotics on her own     HPI Pt is here for a sick visit. -Last night had to get up to bathroom 3-4 times. Pain and discomfort with urination. Supposed to see urologist on Monday. Has had UTI on and off since jan. Has been checked by obgyn for prolapse and that was fine.  -Earlier today top of head started hurting and has felt dizzy off and on along same time frame of UTI symptoms. Pt will notify office if not improving with UTI treatment -UA indicates infection and will start macrobid and adjust accordingly. Pt will still see urology Monday and may need prophylaxis  Current Medication:  Outpatient Encounter Medications as of 07/06/2021  Medication Sig   amLODipine (NORVASC) 10 MG tablet Take 1 tablet (10 mg total) by mouth daily.   aspirin EC 81 MG tablet Take 81 mg by mouth daily.   clobetasol (TEMOVATE) 0.05 % external solution Patient to mix in jar of CeraVe cream. Apply 1-2 times a day as needed for itchy rash. Avoid face, groin, underarms.   hydrochlorothiazide (HYDRODIURIL) 12.5 MG tablet Take 1 tablet (12.5 mg total) by mouth daily.   mirabegron ER (MYRBETRIQ) 25 MG TB24 tablet Take 1 tablet (25 mg total) by mouth daily.   Multiple Vitamin (MULTIVITAMIN) tablet Take 1 tablet by mouth daily.   nitrofurantoin, macrocrystal-monohydrate, (MACROBID) 100 MG capsule Take 1 cap twice per day for 10 days.   omeprazole (PRILOSEC) 20 MG capsule Take 20 mg by mouth daily.   [DISCONTINUED] Zoster Vaccine Adjuvanted [facility]) injection Inject 0.5 mLs into  the muscle once.   [EXPIRED] Zoster Vaccine Adjuvanted [facility]) injection Inject 0.5 mLs into the muscle once for 1 dose.   No facility-administered encounter medications on file as of 07/06/2021.      Medical History: Past Medical History:  Diagnosis Date   Actinic keratosis 03/15/2021   L post thigh    Basal cell carcinoma 06/21/2021   L alar crease, pt sched for EDC   GERD (gastroesophageal reflux disease)    Hyperlipidemia    Hypertension    Plantar fasciitis    Sleep apnea      Vital Signs: BP (!) 142/70 Comment: 168/78, 152/50, 146/69  Pulse 65   Temp (!) 97.2 F (36.2 C)   Resp 16   Ht 4\' 8"  (1.422 m)   Wt 139 lb 12.8 oz (63.4 kg)   SpO2 99%   BMI 31.34 kg/m    Review of Systems  Constitutional:  Negative for fatigue and fever.  HENT:  Negative for congestion, mouth sores and postnasal drip.   Respiratory:  Negative for cough.   Cardiovascular:  Negative for chest pain.  Genitourinary:  Positive for dysuria, frequency and urgency. Negative for flank pain and hematuria.  Neurological:  Positive for dizziness and headaches.  Psychiatric/Behavioral: Negative.     Physical Exam Vitals and nursing note reviewed.  Constitutional:      General: She is not in acute distress.    Appearance: She is well-developed. She is not diaphoretic.  HENT:     Head: Normocephalic and atraumatic.     Mouth/Throat:     Pharynx: No oropharyngeal exudate.  Eyes:     Pupils: Pupils are equal, round, and reactive to light.  Neck:     Thyroid: No thyromegaly.     Vascular: No JVD.     Trachea: No tracheal deviation.  Cardiovascular:     Rate and Rhythm: Normal rate and regular rhythm.     Heart sounds: Normal heart sounds. No murmur heard.   No friction rub. No gallop.  Pulmonary:     Effort: Pulmonary effort is normal. No respiratory distress.     Breath sounds: No wheezing or rales.  Chest:     Chest wall: No tenderness.  Abdominal:     General: Bowel sounds are  normal.     Palpations: Abdomen is soft.     Tenderness: There is no abdominal tenderness. There is no right CVA tenderness or left CVA tenderness.  Musculoskeletal:        General: Normal range of motion.     Cervical back: Normal range of motion and neck supple.  Lymphadenopathy:     Cervical: No cervical adenopathy.  Skin:    General: Skin is warm and dry.  Neurological:     Mental Status: She is alert and oriented to person, place, and time.     Cranial Nerves: No cranial nerve deficit.  Psychiatric:        Behavior: Behavior normal.        Thought Content: Thought content normal.        Judgment: Judgment normal.      Assessment/Plan: 1. Dysuria UA indicated UTI--start macrobid and adjust as indicated by C/S, follow up with urology, may need prophylaxis.   - POCT Urinalysis Dipstick - nitrofurantoin, macrocrystal-monohydrate, (MACROBID) 100 MG capsule; Take 1 cap twice per day for 10 days.  Dispense: 20 capsule; Refill: 0 - CULTURE, URINE COMPREHENSIVE  2. Essential hypertension Initially elevated but improved on exam. Pt will need to monitor at home  3. Other hyperlipidemia - Lipid Panel With LDL/HDL Ratio  4. Vitamin B12 deficiency - B12 and Folate Panel  5. Need for shingles vaccine - Zoster Vaccine Adjuvanted [facility]) injection; Inject 0.5 mLs into the muscle once for 1 dose.  Dispense: 0.5 mL; Refill: 0  6. Other fatigue Will update labs before CPE - CBC w/Diff/Platelet - Comprehensive metabolic panel - TSH + free T4 - Lipid Panel With LDL/HDL Ratio - B12 and Folate Panel   General Counseling: Cynthia Keith verbalizes understanding of the findings of todays visit and agrees with plan of treatment. I have discussed any further diagnostic evaluation that may be needed or ordered today. We also reviewed her medications today. she has been encouraged to call the office with any questions or concerns that should arise related to todays  visit.    Counseling:    Orders Placed This Encounter  Procedures   CULTURE, URINE COMPREHENSIVE   CBC w/Diff/Platelet   Comprehensive metabolic panel   TSH + free T4   Lipid Panel With LDL/HDL Ratio   B12 and Folate Panel   POCT Urinalysis Dipstick    Meds ordered this encounter  Medications   Zoster Vaccine Adjuvanted [facility]) injection    Sig: Inject 0.5 mLs into the muscle once for 1 dose.    Dispense:  0.5 mL    Refill:  0   nitrofurantoin, macrocrystal-monohydrate, (MACROBID) 100 MG capsule    Sig: Take 1  cap twice per day for 10 days.    Dispense:  20 capsule    Refill:  0    Time spent:35 Minutes

## 2021-07-10 LAB — CULTURE, URINE COMPREHENSIVE

## 2021-07-11 ENCOUNTER — Ambulatory Visit: Payer: Medicare HMO | Admitting: Physician Assistant

## 2021-07-11 ENCOUNTER — Encounter: Payer: Self-pay | Admitting: Physician Assistant

## 2021-07-11 ENCOUNTER — Other Ambulatory Visit: Payer: Self-pay

## 2021-07-11 VITALS — BP 162/74 | HR 71 | Ht <= 58 in | Wt 135.0 lb

## 2021-07-11 DIAGNOSIS — N3281 Overactive bladder: Secondary | ICD-10-CM

## 2021-07-11 DIAGNOSIS — N390 Urinary tract infection, site not specified: Secondary | ICD-10-CM | POA: Diagnosis not present

## 2021-07-11 LAB — BLADDER SCAN AMB NON-IMAGING

## 2021-07-11 NOTE — Patient Instructions (Signed)
Continue Macrobid antibiotics. Continue cranberry supplements. Continue Myrbetriq samples. When these run out, call our office to let me know if you would like to continue the medication, stop it, or increase the dose to help with your urinary urgency, frequency, and urge leakage.

## 2021-07-11 NOTE — Progress Notes (Signed)
07/11/2021 2:41 PM   DANAJAH BIRDSELL 16-Aug-1946 [id number]  CC: Chief Complaint  Patient presents with   Follow-up    HPI: BETHANNIE IGLEHART is a 75 y.o. female with PMH pelvic organ prolapse s/p robotic colpopexy, cystourethroscopy, and bladder sling with Dr. Salome Holmes at [facility] on 01/07/2015; right renal mass on surveillance; recurrent UTI on cranberry supplements; and OAB wet who presents today for symptom recheck on Myrbetriq 25 mg daily.   Today she reports improvement in frequency, urgency, and resolution of urge incontinence on Myrbetriq 25 mg daily.  Overall, she reports minimal bother from her OAB symptoms and attributes these to normal aging.  She was seen by her PCP 5 days ago with reports of dysuria and malaise.  Urine culture ultimately resulted with Augmentin, cefazolin, and cefuroxime resistant Enterobacter cloacae.  She is on culture appropriate Macrobid and states her dysuria has resolved, though she does have some persistent fatigue.  She discussed her right breast tenderness with her PCP and is scheduled for mammography in September.  Additionally, she reports 1 day of low back pain radiating to the left buttock that has resolved spontaneously within the last week.  She is unsure if this represents kidney pain.  PVR 62mL.  PMH: Past Medical History:  Diagnosis Date   Actinic keratosis 03/15/2021   L post thigh    Basal cell carcinoma 06/21/2021   L alar crease, pt sched for EDC   GERD (gastroesophageal reflux disease)    Hyperlipidemia    Hypertension    Plantar fasciitis    Sleep apnea     Surgical History: Past Surgical History:  Procedure Laterality Date   ABDOMINAL HYSTERECTOMY     COLONOSCOPY WITH PROPOFOL N/A 12/12/2015   Procedure: COLONOSCOPY WITH PROPOFOL;  Surgeon: Manya Silvas, MD;  Location: [place];  Service: Endoscopy;  Laterality: N/A;   HAND SURGERY  2005    Home Medications:  Allergies as of 07/11/2021       Reactions    Iodinated Diagnostic Agents Anaphylaxis   Remote hx of respiratory arrest after IV iodine for kidney study. Immediately treated w/o sequela. No previous or subsequent IVP exposure. Remote hx of respiratory arrest after IV iodine for kidney study. Immediately treated w/o sequela. No previous or subsequent IVP exposure. Remote hx of respiratory arrest after IV iodine for kidney study. Immediately treated w/o sequela. No previous or subsequent IVP exposure.   Iodine Shortness Of Breath   "Quit Breathing"        Medication List        Accurate as of July 11, 2021  2:41 PM. If you have any questions, ask your nurse or doctor.          amLODipine 10 MG tablet Commonly known as: NORVASC Take 1 tablet (10 mg total) by mouth daily.   aspirin EC 81 MG tablet Take 81 mg by mouth daily.   clobetasol 0.05 % external solution Commonly known as: TEMOVATE Patient to mix in jar of CeraVe cream. Apply 1-2 times a day as needed for itchy rash. Avoid face, groin, underarms.   hydrochlorothiazide 12.5 MG tablet Commonly known as: HYDRODIURIL Take 1 tablet (12.5 mg total) by mouth daily.   mirabegron ER 25 MG Tb24 tablet Commonly known as: MYRBETRIQ Take 1 tablet (25 mg total) by mouth daily.   multivitamin tablet Take 1 tablet by mouth daily.   nitrofurantoin (macrocrystal-monohydrate) 100 MG capsule Commonly known as: Macrobid Take 1 cap twice per day for 10  days.   omeprazole 20 MG capsule Commonly known as: PRILOSEC Take 20 mg by mouth daily.        Allergies:  Allergies  Allergen Reactions   Iodinated Diagnostic Agents Anaphylaxis    Remote hx of respiratory arrest after IV iodine for kidney study. Immediately treated w/o sequela. No previous or subsequent IVP exposure. Remote hx of respiratory arrest after IV iodine for kidney study. Immediately treated w/o sequela. No previous or subsequent IVP exposure. Remote hx of respiratory arrest after IV iodine for kidney study.  Immediately treated w/o sequela. No previous or subsequent IVP exposure.   Iodine Shortness Of Breath    "Quit Breathing"    Family History: Family History  Problem Relation Age of Onset   Breast cancer Cousin    Stroke Mother    Heart disease Paternal Grandmother    Cancer Paternal Grandfather    Stroke Sister    Stroke Brother     Social History:   reports that she has never smoked. She has never used smokeless tobacco. She reports current alcohol use. She reports that she does not use drugs.  Physical Exam: BP (!) 162/74   Pulse 71   Ht 4\' 8"  (1.422 m)   Wt 135 lb (61.2 kg)   BMI 30.27 kg/m   Constitutional:  Alert and oriented, no acute distress, nontoxic appearing HEENT: [place], AT Cardiovascular: No clubbing, cyanosis, or edema Respiratory: Normal respiratory effort, no increased work of breathing GU: No CVA tenderness Skin: No rashes, bruises or suspicious lesions Neurologic: Grossly intact, no focal deficits, moving all 4 extremities Psychiatric: Normal mood and affect  Laboratory Data: Results for orders placed or performed in visit on 07/11/21  Bladder Scan (Post Void Residual) in office  Result Value Ref Range   Scan Result 38mL    Assessment & Plan:   1. Overactive bladder Symptoms improved on Myrbetriq 25 mg daily, however patient reports minimal bother at baseline and is unsure if she wishes to continue the medication.  She has some samples leftover and would like to finish these and will contact the office with her decision regarding stopping the medication, continuing, or increasing her dose to 50 mg daily. - Bladder Scan (Post Void Residual) in office  2. Recurrent UTI On culture appropriate antibiotics for recurrent UTI.  Counseled her to complete antibiotics as prescribed.  Ideally would like to defer suppressive agents and start her on topical vaginal estrogen cream if mammography in September is negative.  Patient is in agreement with this plan.  Low  concern for pyelonephritis given stable vitals and no CVA tenderness today.  I suspect her recent back pain was musculoskeletal in origin.  Return if symptoms worsen or fail to improve.  Debroah Loop, PA-C  [facility] [address] [place], [place] [address] [phone]

## 2021-07-17 ENCOUNTER — Telehealth: Payer: Self-pay

## 2021-07-17 NOTE — Telephone Encounter (Signed)
Patient called stating she had dinner with friends last night. Friend tested positive for covid this morning. Asking what she should do. Per Nimisha, she must wear mask when she goes out. If she starts feeling symptoms, she is to quarantine herself.

## 2021-07-31 ENCOUNTER — Ambulatory Visit: Payer: Medicare HMO | Admitting: Dermatology

## 2021-07-31 ENCOUNTER — Other Ambulatory Visit: Payer: Self-pay

## 2021-07-31 DIAGNOSIS — C44311 Basal cell carcinoma of skin of nose: Secondary | ICD-10-CM | POA: Diagnosis not present

## 2021-07-31 DIAGNOSIS — L820 Inflamed seborrheic keratosis: Secondary | ICD-10-CM | POA: Diagnosis not present

## 2021-07-31 DIAGNOSIS — L578 Other skin changes due to chronic exposure to nonionizing radiation: Secondary | ICD-10-CM | POA: Diagnosis not present

## 2021-07-31 DIAGNOSIS — L821 Other seborrheic keratosis: Secondary | ICD-10-CM

## 2021-07-31 NOTE — Patient Instructions (Signed)
Electrodesiccation and Curettage ("Scrape and Burn") Wound Care Instructions  Leave the original bandage on for 24 hours if possible.  If the bandage becomes soaked or soiled before that time, it is OK to remove it and examine the wound.  A small amount of post-operative bleeding is normal.  If excessive bleeding occurs, remove the bandage, place gauze over the site and apply continuous pressure (no peeking) over the area for 30 minutes. If this does not work, please call our clinic as soon as possible or page your doctor if it is after hours.   Once a day, cleanse the wound with soap and water. It is fine to shower. If a thick crust develops you may use a Q-tip dipped into dilute hydrogen peroxide (mix 1:1 with water) to dissolve it.  Hydrogen peroxide can slow the healing process, so use it only as needed.    After washing, apply petroleum jelly (Vaseline) or an antibiotic ointment if your doctor prescribed one for you, followed by a bandage.    For best healing, the wound should be covered with a layer of ointment at all times. If you are not able to keep the area covered with a bandage to hold the ointment in place, this may mean re-applying the ointment several times a day.  Continue this wound care until the wound has healed and is no longer open. It may take several weeks for the wound to heal and close.  Itching and mild discomfort is normal during the healing process.  If you have any discomfort, you can take Tylenol (acetaminophen) or ibuprofen as directed on the bottle. (Please do not take these if you have an allergy to them or cannot take them for another reason).  Some redness, tenderness and white or yellow material in the wound is normal healing.  If the area becomes very sore and red, or develops a thick yellow-green material (pus), it may be infected; please notify us.    Wound healing continues for up to one year following surgery. It is not unusual to experience pain in the scar  from time to time during the interval.  If the pain becomes severe or the scar thickens, you should notify the office.    A slight amount of redness in a scar is expected for the first six months.  After six months, the redness will fade and the scar will soften and fade.  The color difference becomes less noticeable with time.  If there are any problems, return for a post-op surgery check at your earliest convenience.  To improve the appearance of the scar, you can use silicone scar gel, cream, or sheets (such as Mederma or Serica) every night for up to one year. These are available over the counter (without a prescription).  Please call our office at [phone] for any questions or concerns.  If you have any questions or concerns for your doctor, please call our main line at [phone] and press option 4 to reach your doctor's medical assistant. If no one answers, please leave a voicemail as directed and we will return your call as soon as possible. Messages left after 4 pm will be answered the following business day.   You may also send [place] a message via [place]. We typically respond to MyChart messages within 1-2 business days.  For prescription refills, please ask your pharmacy to contact our office. Our fax number is [phone].  If you have an urgent issue when the clinic is closed that  cannot wait until the next business day, you can page your doctor at the number below.    Please note that while we do our best to be available for urgent issues outside of office hours, we are not available 24/7.   If you have an urgent issue and are unable to reach [place], you may choose to seek medical care at your doctor's office, retail clinic, urgent care center, or emergency room.  If you have a medical emergency, please immediately call 911 or go to the emergency department.  Pager Numbers  - Dr. Nehemiah Massed: [phone]  - Dr. Laurence Ferrari: [phone]  - Dr. Nicole Kindred: [phone]  In the event  of inclement weather, please call our main line at [phone] for an update on the status of any delays or closures.  Dermatology Medication Tips: Please keep the boxes that topical medications come in in order to help keep track of the instructions about where and how to use these. Pharmacies typically print the medication instructions only on the boxes and not directly on the medication tubes.   If your medication is too expensive, please contact our office at [phone] option 4 or send [place] a message through [place].   We are unable to tell what your co-pay for medications will be in advance as this is different depending on your insurance coverage. However, we may be able to find a substitute medication at lower cost or fill out paperwork to get insurance to cover a needed medication.   If a prior authorization is required to get your medication covered by your insurance company, please allow [place] 1-2 business days to complete this process.  Drug prices often vary depending on where the prescription is filled and some pharmacies may offer cheaper prices.  The website www.goodrx.com contains coupons for medications through different pharmacies. The prices here do not account for what the cost may be with help from insurance (it may be cheaper with your insurance), but the website can give you the price if you did not use any insurance.  - You can print the associated coupon and take it with your prescription to the pharmacy.  - You may also stop by our office during regular business hours and pick up a GoodRx coupon card.  - If you need your prescription sent electronically to a different pharmacy, notify our office through [facility] or by phone at [phone] option 4.

## 2021-07-31 NOTE — Progress Notes (Signed)
   Follow-Up Visit   Subjective  Cynthia Keith is a 75 y.o. female who presents for the following: Follow-up (Patient here today for [facility] of left alar crease. Biopsy proven bcc. Patient states area is healing well. Patient has a place on chest and itchy spot at right posterior ankle. ).   The following portions of the chart were reviewed this encounter and updated as appropriate:  Tobacco  Allergies  Meds  Problems  Med Hx  Surg Hx  Fam Hx      Objective  Well appearing patient in no apparent distress; mood and affect are within normal limits.  A focused examination was performed including left alar crease, right clavicle area, right ankle area. Relevant physical exam findings are noted in the Assessment and Plan.  Left Alar Crease Healing biopsy   right clavicle area x 1 Erythematous keratotic or waxy stuck-on papule or plaque.   Assessment & Plan  Basal cell carcinoma (BCC) of skin of nose Left Alar Crease  Destruction of lesion Complexity: extensive   Destruction method: electrodesiccation and curettage   Informed consent: discussed and consent obtained   Timeout:  patient name, date of birth, surgical site, and procedure verified Procedure prep:  Patient was prepped and draped in usual sterile fashion Prep type:  Isopropyl alcohol Anesthesia: the lesion was anesthetized in a standard fashion   Anesthetic:  1% lidocaine w/ epinephrine 1-100,000 buffered w/ 8.4% NaHCO3 Curettage performed in three different directions: Yes   Electrodesiccation performed over the curetted area: Yes   Lesion length (cm):  0.3 Lesion width (cm):  0.3 Margin per side (cm):  0.2 Final wound size (cm):  0.7 Hemostasis achieved with:  pressure, aluminum chloride and electrodesiccation Outcome: patient tolerated procedure well with no complications   Post-procedure details: sterile dressing applied and wound care instructions given   Dressing type: bandage and petrolatum    Biopsy proven  bcc at left alar crease Accession:  [id number] Recommend ED&C  Inflamed seborrheic keratosis right clavicle area x 1  Destruction of lesion - right clavicle area x 1 Complexity: simple   Destruction method: cryotherapy   Informed consent: discussed and consent obtained   Timeout:  patient name, date of birth, surgical site, and procedure verified Lesion destroyed using liquid nitrogen: Yes   Region frozen until ice ball extended beyond lesion: Yes   Outcome: patient tolerated procedure well with no complications   Post-procedure details: wound care instructions given    Seborrheic Keratoses - Stuck-on, waxy, tan-brown papules and/or plaques  at right ankle - Benign-appearing - Discussed benign etiology and prognosis. - Observe - Call for any changes  Actinic Damage - chronic, secondary to cumulative UV radiation exposure/sun exposure over time - diffuse scaly erythematous macules with underlying dyspigmentation - Recommend daily broad spectrum sunscreen SPF 30+ to sun-exposed areas, reapply every 2 hours as needed.  - Recommend staying in the shade or wearing long sleeves, sun glasses (UVA+UVB protection) and wide brim hats (4-inch brim around the entire circumference of the hat). - Call for new or changing lesions.  Return for keep follow up as scheduled . IRuthell Rummage, CMA, am acting as scribe for Sarina Ser, MD.  Documentation: I have reviewed the above documentation for accuracy and completeness, and I agree with the above.  Sarina Ser, MD

## 2021-08-01 ENCOUNTER — Encounter: Payer: Self-pay | Admitting: Dermatology

## 2021-08-14 ENCOUNTER — Other Ambulatory Visit: Payer: Self-pay | Admitting: Internal Medicine

## 2021-08-14 DIAGNOSIS — N6001 Solitary cyst of right breast: Secondary | ICD-10-CM

## 2021-08-15 ENCOUNTER — Other Ambulatory Visit: Payer: Self-pay | Admitting: Internal Medicine

## 2021-08-15 DIAGNOSIS — I10 Essential (primary) hypertension: Secondary | ICD-10-CM

## 2021-09-11 ENCOUNTER — Ambulatory Visit
Admission: RE | Admit: 2021-09-11 | Discharge: 2021-09-11 | Disposition: A | Discharge status: Home / Self Care | Payer: Medicare HMO | Source: Ambulatory Visit | Attending: Internal Medicine | Admitting: Internal Medicine

## 2021-09-11 ENCOUNTER — Other Ambulatory Visit: Payer: Self-pay

## 2021-09-11 DIAGNOSIS — N6001 Solitary cyst of right breast: Secondary | ICD-10-CM

## 2021-09-11 DIAGNOSIS — E538 Deficiency of other specified B group vitamins: Secondary | ICD-10-CM | POA: Diagnosis not present

## 2021-09-11 DIAGNOSIS — E7849 Other hyperlipidemia: Secondary | ICD-10-CM | POA: Diagnosis not present

## 2021-09-11 DIAGNOSIS — R922 Inconclusive mammogram: Secondary | ICD-10-CM | POA: Diagnosis not present

## 2021-09-11 DIAGNOSIS — R5383 Other fatigue: Secondary | ICD-10-CM | POA: Diagnosis not present

## 2021-09-12 LAB — CBC WITH DIFFERENTIAL/PLATELET
Basophils Absolute: 0.1 10*3/uL (ref 0.0–0.2)
Basos: 1 %
EOS (ABSOLUTE): 0.3 10*3/uL (ref 0.0–0.4)
Eos: 6 %
Hematocrit: 42.1 % (ref 34.0–46.6)
Hemoglobin: 14.7 g/dL (ref 11.1–15.9)
Immature Grans (Abs): 0 10*3/uL (ref 0.0–0.1)
Immature Granulocytes: 0 %
Lymphocytes Absolute: 2.3 10*3/uL (ref 0.7–3.1)
Lymphs: 38 %
MCH: 31.7 pg (ref 26.6–33.0)
MCHC: 34.9 g/dL (ref 31.5–35.7)
MCV: 91 fL (ref 79–97)
Monocytes Absolute: 0.7 10*3/uL (ref 0.1–0.9)
Monocytes: 11 %
Neutrophils Absolute: 2.7 10*3/uL (ref 1.4–7.0)
Neutrophils: 44 %
Platelets: 245 10*3/uL (ref 150–450)
RBC: 4.63 x10E6/uL (ref 3.77–5.28)
RDW: 12.9 % (ref 11.7–15.4)
WBC: 6.1 10*3/uL (ref 3.4–10.8)

## 2021-09-12 LAB — LIPID PANEL WITH LDL/HDL RATIO
Cholesterol, Total: 262 mg/dL — ABNORMAL HIGH (ref 100–199)
HDL: 55 mg/dL (ref >39)
LDL Chol Calc (NIH): 189 mg/dL — ABNORMAL HIGH (ref 0–99)
LDL/HDL Ratio: 3.4 ratio — ABNORMAL HIGH (ref 0.0–3.2)
Triglycerides: 102 mg/dL (ref 0–149)
VLDL Cholesterol Cal: 18 mg/dL (ref 5–40)

## 2021-09-12 LAB — COMPREHENSIVE METABOLIC PANEL
ALT: 26 IU/L (ref 0–32)
AST: 36 IU/L (ref 0–40)
Albumin/Globulin Ratio: 1.5 (ref 1.2–2.2)
Albumin: 4.5 g/dL (ref 3.7–4.7)
Alkaline Phosphatase: 74 IU/L (ref 44–121)
BUN/Creatinine Ratio: 19 (ref 12–28)
BUN: 13 mg/dL (ref 8–27)
Bilirubin Total: 0.4 mg/dL (ref 0.0–1.2)
CO2: 25 mmol/L (ref 20–29)
Calcium: 10.6 mg/dL — ABNORMAL HIGH (ref 8.7–10.3)
Chloride: 101 mmol/L (ref 96–106)
Creatinine, Ser: 0.67 mg/dL (ref 0.57–1.00)
Globulin, Total: 3 g/dL (ref 1.5–4.5)
Glucose: 97 mg/dL (ref 65–99)
Potassium: 3.8 mmol/L (ref 3.5–5.2)
Sodium: 140 mmol/L (ref 134–144)
Total Protein: 7.5 g/dL (ref 6.0–8.5)
eGFR: 92 mL/min/{1.73_m2} (ref >59)

## 2021-09-12 LAB — B12 AND FOLATE PANEL
Folate: >20 ng/mL (ref >3.0)
Vitamin B-12: 1425 pg/mL — ABNORMAL HIGH (ref 232–1245)

## 2021-09-12 LAB — TSH+FREE T4
Free T4: 1.15 ng/dL (ref 0.82–1.77)
TSH: 1.78 u[IU]/mL (ref 0.450–4.500)

## 2021-09-18 ENCOUNTER — Other Ambulatory Visit: Payer: Self-pay

## 2021-09-18 ENCOUNTER — Encounter: Payer: Self-pay | Admitting: Physician Assistant

## 2021-09-18 ENCOUNTER — Encounter (INDEPENDENT_AMBULATORY_CARE_PROVIDER_SITE_OTHER): Payer: Self-pay

## 2021-09-18 ENCOUNTER — Ambulatory Visit (INDEPENDENT_AMBULATORY_CARE_PROVIDER_SITE_OTHER): Payer: Medicare HMO | Admitting: Physician Assistant

## 2021-09-18 DIAGNOSIS — R0989 Other specified symptoms and signs involving the circulatory and respiratory systems: Secondary | ICD-10-CM

## 2021-09-18 DIAGNOSIS — I10 Essential (primary) hypertension: Secondary | ICD-10-CM | POA: Diagnosis not present

## 2021-09-18 DIAGNOSIS — K219 Gastro-esophageal reflux disease without esophagitis: Secondary | ICD-10-CM | POA: Diagnosis not present

## 2021-09-18 DIAGNOSIS — E782 Mixed hyperlipidemia: Secondary | ICD-10-CM | POA: Diagnosis not present

## 2021-09-18 DIAGNOSIS — Z0001 Encounter for general adult medical examination with abnormal findings: Secondary | ICD-10-CM

## 2021-09-18 DIAGNOSIS — Z23 Encounter for immunization: Secondary | ICD-10-CM | POA: Diagnosis not present

## 2021-09-18 DIAGNOSIS — R300 Dysuria: Secondary | ICD-10-CM

## 2021-09-18 MED ORDER — ROSUVASTATIN CALCIUM 5 MG PO TABS: 5.0000 mg | ORAL_TABLET | Freq: Every day | ORAL | 3 refills | Status: DC

## 2021-09-18 NOTE — Progress Notes (Signed)
[facility] [place], [place] [address]  Internal MEDICINE  Office Visit Note  Patient Name: Cynthia Keith  [id number]  [id number]  Date of Service: 09/19/2021  Chief Complaint  Patient presents with   Medicare Wellness   Hypertension   Hyperlipidemia     HPI Pt is here for routine health maintenance examination -Still having some abdominal pain after eating sometimes but not always. We have already checked her GB and is follow by heme/onc for hemachromatosis, went in July and looked good. -Saw urology who follow her for renal mass, Oab, and recurrent UTIs and states it went well, she continues on myrbetriq for OAB -Had her mammogram/US and was stable and told 1 year follow up -Has a rash under breasts that she treats with baby powder and corn starch which has been working well for her -does take dutera vitamin supplements BID -reviewed lab results: which showed elevated B12 and elevated calcium-will back down to single vitamin per day. Also showed high cholesterol and discussed starting crestor nightly and obtaining carotid US for further eval -BP much better in office today since adding low dose HCTZ -up to date on PHM screenings  Current Medication: Outpatient Encounter Medications as of 09/18/2021  Medication Sig   amLODipine (NORVASC) 10 MG tablet TAKE 1 TABLET BY MOUTH EVERY DAY   aspirin EC 81 MG tablet Take 81 mg by mouth daily.   clobetasol (TEMOVATE) 0.05 % external solution Patient to mix in jar of CeraVe cream. Apply 1-2 times a day as needed for itchy rash. Avoid face, groin, underarms.   hydrochlorothiazide (HYDRODIURIL) 12.5 MG tablet Take 1 tablet (12.5 mg total) by mouth daily.   mirabegron ER (MYRBETRIQ) 25 MG TB24 tablet Take 1 tablet (25 mg total) by mouth daily.   Multiple Vitamin (MULTIVITAMIN) tablet Take 1 tablet by mouth daily.   nitrofurantoin, macrocrystal-monohydrate, (MACROBID) 100 MG capsule Take 1 cap twice per day for 10  days.   omeprazole (PRILOSEC) 20 MG capsule Take 20 mg by mouth daily.   rosuvastatin (CRESTOR) 5 MG tablet Take 1 tablet (5 mg total) by mouth daily.   SHINGRIX injection    No facility-administered encounter medications on file as of 09/18/2021.    Surgical History: Past Surgical History:  Procedure Laterality Date   ABDOMINAL HYSTERECTOMY     COLONOSCOPY WITH PROPOFOL N/A 12/12/2015   Procedure: COLONOSCOPY WITH PROPOFOL;  Surgeon: Manya Silvas, MD;  Location: [facility] ENDOSCOPY;  Service: Endoscopy;  Laterality: N/A;   HAND SURGERY  2005    Medical History: Past Medical History:  Diagnosis Date   Actinic keratosis 03/15/2021   L post thigh    Basal cell carcinoma 06/21/2021   L alar crease, pt sched for EDC   GERD (gastroesophageal reflux disease)    Hyperlipidemia    Hypertension    Plantar fasciitis    Sleep apnea     Family History: Family History  Problem Relation Age of Onset   Breast cancer Cousin    Stroke Mother    Heart disease Paternal Grandmother    Cancer Paternal Grandfather    Stroke Sister    Stroke Brother       Review of Systems  Constitutional:  Negative for chills, fatigue and unexpected weight change.  HENT:  Negative for congestion, postnasal drip, rhinorrhea, sneezing and sore throat.   Eyes:  Negative for redness.  Respiratory:  Negative for cough, chest tightness and shortness of breath.   Cardiovascular:  Negative  for chest pain and palpitations.  Gastrointestinal:  Negative for abdominal pain, constipation, diarrhea, nausea and vomiting.  Genitourinary:  Negative for dysuria and frequency.  Musculoskeletal:  Negative for arthralgias, back pain, joint swelling and neck pain.  Skin:  Negative for rash.  Neurological: Negative.  Negative for tremors and numbness.  Hematological:  Negative for adenopathy. Does not bruise/bleed easily.  Psychiatric/Behavioral:  Negative for behavioral problems (Depression), sleep disturbance and suicidal  ideas. The patient is not nervous/anxious.     Vital Signs: BP 140/73   Pulse 64   Temp 97.8 F (36.6 C)   Resp 16   Ht _0  (1.422 m)   Wt 139 lb (63 kg)   SpO2 96%   BMI 31.16 kg/m    Physical Exam Vitals and nursing note reviewed.  Constitutional:      General: She is not in acute distress.    Appearance: She is well-developed. She is obese. She is not diaphoretic.  HENT:     Head: Normocephalic and atraumatic.     Right Ear: External ear normal.     Left Ear: External ear normal.     Nose: Nose normal.     Mouth/Throat:     Pharynx: No oropharyngeal exudate.  Eyes:     General: No scleral icterus.       Right eye: No discharge.        Left eye: No discharge.     Conjunctiva/sclera: Conjunctivae normal.     Pupils: Pupils are equal, round, and reactive to light.  Neck:     Thyroid: No thyromegaly.     Vascular: No JVD.     Trachea: No tracheal deviation.  Cardiovascular:     Rate and Rhythm: Normal rate and regular rhythm.     Heart sounds: Normal heart sounds. No murmur heard.   No friction rub. No gallop.  Pulmonary:     Effort: Pulmonary effort is normal. No respiratory distress.     Breath sounds: Normal breath sounds. No stridor. No wheezing or rales.  Chest:     Chest wall: No tenderness.  Abdominal:     General: Bowel sounds are normal. There is no distension.     Palpations: Abdomen is soft. There is no mass.     Tenderness: There is no abdominal tenderness. There is no guarding or rebound.  Musculoskeletal:        General: No tenderness or deformity. Normal range of motion.     Cervical back: Normal range of motion and neck supple.  Lymphadenopathy:     Cervical: No cervical adenopathy.  Skin:    General: Skin is warm and dry.     Coloration: Skin is not pale.     Findings: No erythema or rash.  Neurological:     Mental Status: She is alert.     Cranial Nerves: No cranial nerve deficit.     Motor: No abnormal muscle tone.     Coordination:  Coordination normal.     Deep Tendon Reflexes: Reflexes are normal and symmetric.  Psychiatric:        Behavior: Behavior normal.        Thought Content: Thought content normal.        Judgment: Judgment normal.     LABS: Recent Results (from the past 2160 hour(s))  CBC with Differential/Platelet     Status: None   Collection Time: 06/30/21  9:40 AM  Result Value Ref Range   WBC 7.0 4.0 - 10.5  K/uL   RBC 4.34 3.87 - 5.11 MIL/uL   Hemoglobin 14.0 12.0 - 15.0 g/dL   HCT 39.8 36.0 - 46.0 %   MCV 91.7 80.0 - 100.0 fL   MCH 32.3 26.0 - 34.0 pg   MCHC 35.2 30.0 - 36.0 g/dL   RDW 12.4 11.5 - 15.5 %   Platelets 233 150 - 400 K/uL   nRBC 0.0 0.0 - 0.2 %   Neutrophils Relative % 49 %   Neutro Abs 3.4 1.7 - 7.7 K/uL   Lymphocytes Relative 36 %   Lymphs Abs 2.5 0.7 - 4.0 K/uL   Monocytes Relative 11 %   Monocytes Absolute 0.8 0.1 - 1.0 K/uL   Eosinophils Relative 3 %   Eosinophils Absolute 0.2 0.0 - 0.5 K/uL   Basophils Relative 1 %   Basophils Absolute 0.0 0.0 - 0.1 K/uL   Immature Granulocytes 0 %   Abs Immature Granulocytes 0.02 0.00 - 0.07 K/uL    Comment: Performed at [facility], [address] [place], [place] [address]  Ferritin     Status: None   Collection Time: 06/30/21  9:40 AM  Result Value Ref Range   Ferritin 92 11 - 307 ng/mL    Comment: Performed at [facility], [address] [place], [place] [address]  Iron and TIBC     Status: None   Collection Time: 06/30/21  9:40 AM  Result Value Ref Range   Iron 114 28 - 170 ug/dL   TIBC 381 250 - 450 ug/dL   Saturation Ratios 30 10.4 - 31.8 %   UIBC 267 ug/dL    Comment: Performed at [facility], [address] [place], [place] [address]  POCT Urinalysis Dipstick     Status: Abnormal   Collection Time: 07/06/21  3:54 PM  Result Value Ref Range   Color, UA     Clarity, UA     Glucose, UA Negative Negative   Bilirubin, UA negative    Ketones, UA small    Spec Grav, UA 1.010 1.010 -  1.025   Blood, UA moderate    pH, UA 7.0 5.0 - 8.0   Protein, UA Positive (A) Negative   Urobilinogen, UA 0.2 0.2 or 1.0 E.U./dL   Nitrite, UA negative    Leukocytes, UA Moderate (2+) (A) Negative   Appearance     Odor    CULTURE, URINE COMPREHENSIVE     Status: Abnormal   Collection Time: 07/06/21  4:02 PM   Specimen: Urine   Urine  Result Value Ref Range   Urine Culture, Comprehensive Final report (A)    Organism ID, Bacteria Enterobacter cloacae (A)     Comment: Greater than 100,000 colony forming units per mL   ANTIMICROBIAL SUSCEPTIBILITY Comment     Comment:       ** S = Susceptible; I = Intermediate; R = Resistant **                    P = Positive; N = Negative             MICS are expressed in micrograms per mL    Antibiotic                 RSLT#1    RSLT#2    RSLT#3    RSLT#4 Amoxicillin/Clavulanic Acid    R Cefazolin  R Cefepime                       S Cefuroxime                     R Ciprofloxacin                  S Ertapenem                      S Gentamicin                     S Imipenem                       S Levofloxacin                   S Meropenem                      S Nitrofurantoin                 S Tetracycline                   S Tobramycin                     S Trimethoprim/Sulfa             S   Bladder Scan (Post Void Residual) in office     Status: None   Collection Time: 07/11/21  2:31 PM  Result Value Ref Range   Scan Result 35m   CBC w/Diff/Platelet     Status: None   Collection Time: 09/11/21  9:59 AM  Result Value Ref Range   WBC 6.1 3.4 - 10.8 x10E3/uL   RBC 4.63 3.77 - 5.28 x10E6/uL   Hemoglobin 14.7 11.1 - 15.9 g/dL   Hematocrit 42.1 34.0 - 46.6 %   MCV 91 79 - 97 fL   MCH 31.7 26.6 - 33.0 pg   MCHC 34.9 31.5 - 35.7 g/dL   RDW 12.9 11.7 - 15.4 %   Platelets 245 150 - 450 x10E3/uL   Neutrophils 44 Not Estab. %   Lymphs 38 Not Estab. %   Monocytes 11 Not Estab. %   Eos 6 Not Estab. %   Basos 1 Not Estab. %    Neutrophils Absolute 2.7 1.4 - 7.0 x10E3/uL   Lymphocytes Absolute 2.3 0.7 - 3.1 x10E3/uL   Monocytes Absolute 0.7 0.1 - 0.9 x10E3/uL   EOS (ABSOLUTE) 0.3 0.0 - 0.4 x10E3/uL   Basophils Absolute 0.1 0.0 - 0.2 x10E3/uL   Immature Granulocytes 0 Not Estab. %   Immature Grans (Abs) 0.0 0.0 - 0.1 x10E3/uL  Comprehensive metabolic panel     Status: Abnormal   Collection Time: 09/11/21  9:59 AM  Result Value Ref Range   Glucose 97 65 - 99 mg/dL    Comment:                **Effective September 18, 2021 Glucose reference**                  interval will be changing to:  70 - 99    BUN 13 8 - 27 mg/dL   Creatinine, Ser 0.67 0.57 - 1.00 mg/dL   eGFR 92 >59 mL/min/1.73   BUN/Creatinine Ratio 19 12 - 28   Sodium 140 134 - 144 mmol/L   Potassium 3.8 3.5 - 5.2 mmol/L   Chloride 101 96 - 106 mmol/L   CO2 25 20 - 29 mmol/L   Calcium 10.6 (H) 8.7 - 10.3 mg/dL   Total Protein 7.5 6.0 - 8.5 g/dL   Albumin 4.5 3.7 - 4.7 g/dL   Globulin, Total 3.0 1.5 - 4.5 g/dL   Albumin/Globulin Ratio 1.5 1.2 - 2.2   Bilirubin Total 0.4 0.0 - 1.2 mg/dL   Alkaline Phosphatase 74 44 - 121 IU/L   AST 36 0 - 40 IU/L   ALT 26 0 - 32 IU/L  TSH + free T4     Status: None   Collection Time: 09/11/21  9:59 AM  Result Value Ref Range   TSH 1.780 0.450 - 4.500 uIU/mL   Free T4 1.15 0.82 - 1.77 ng/dL  Lipid Panel With LDL/HDL Ratio     Status: Abnormal   Collection Time: 09/11/21  9:59 AM  Result Value Ref Range   Cholesterol, Total 262 (H) 100 - 199 mg/dL   Triglycerides 102 0 - 149 mg/dL   HDL 55 >39 mg/dL   VLDL Cholesterol Cal 18 5 - 40 mg/dL   LDL Chol Calc (NIH) 189 (H) 0 - 99 mg/dL   LDL/HDL Ratio 3.4 (H) 0.0 - 3.2 ratio    Comment:                                     LDL/HDL Ratio                                             Men  Women                               1/2 Avg.Risk  1.0    1.5                                   Avg.Risk  3.6    3.2                                 2X Avg.Risk  6.2    5.0                                3X Avg.Risk  8.0    6.1   B12 and Folate Panel     Status: Abnormal   Collection Time: 09/11/21  9:59 AM  Result Value Ref Range   Vitamin B-12 1,425 (H) 232 - 1,245 pg/mL   Folate >20.0 >3.0 ng/mL    Comment: A serum folate concentration of less than 3.1 ng/mL is considered to represent clinical deficiency.   UA/M w/rflx Culture, Routine     Status: Abnormal (Preliminary result)   Collection Time: 09/18/21  4:32 PM   Specimen: Urine  Urine  Result Value Ref Range   Specific Gravity, UA 1.006 1.005 - 1.030   pH, UA 8.0 (H) 5.0 - 7.5   Color, UA Yellow Yellow   Appearance Ur Clear Clear   Leukocytes,UA 2+ (A) Negative   Protein,UA Negative Negative/Trace   Glucose, UA Negative Negative   Ketones, UA Negative Negative   RBC, UA Negative Negative   Bilirubin, UA Negative Negative   Urobilinogen, Ur 0.2 0.2 - 1.0 mg/dL   Nitrite, UA Negative Negative   Microscopic Examination See below:     Comment: Microscopic was indicated and was performed.   Urinalysis Reflex Comment     Comment: This specimen has reflexed to a Urine Culture.  Microscopic Examination     Status: Abnormal   Collection Time: 09/18/21  4:32 PM   Urine  Result Value Ref Range   WBC, UA 6-10 (A) 0 - 5 /hpf   RBC None seen 0 - 2 /hpf   Epithelial Cells (non renal) 0-10 0 - 10 /hpf   Casts None seen None seen /lpf   Bacteria, UA Few None seen/Few  Urine Culture, Reflex     Status: None (Preliminary result)   Collection Time: 09/18/21  4:32 PM   Urine  Result Value Ref Range   Urine Culture, Routine WILL FOLLOW         Assessment/Plan: 1. Encounter for general adult medical examination with abnormal findings CPE performed, labs reviewed and UTD on PHM screenings  2. Essential hypertension Much improved, continue current medications  3. Mixed hyperlipidemia Will start on crestor and order carotid US for further  investigation - rosuvastatin (CRESTOR) 5 MG tablet; Take 1 tablet (5 mg total) by mouth daily.  Dispense: 90 tablet; Refill: 3 - US Carotid Duplex Bilateral; Future  4. Bilateral carotid bruits Will start on crestor and order carotid US for further investiagtion - US Carotid Duplex Bilateral; Future  5. Hemochromatosis, unspecified hemochromatosis type Followed by heme/onc  6. Gastroesophageal reflux disease without esophagitis Continue omeprazole  7. Flu vaccine need - Flu Vaccine MDCK QUAD PF   General Counseling: Jatasia verbalizes understanding of the findings of todays visit and agrees with plan of treatment. I have discussed any further diagnostic evaluation that may be needed or ordered today. We also reviewed her medications today. she has been encouraged to call the office with any questions or concerns that should arise related to todays visit.    Counseling:    Orders Placed This Encounter  Procedures   Microscopic Examination   Urine Culture, Reflex   US Carotid Duplex Bilateral   Flu Vaccine MDCK QUAD PF   UA/M w/rflx Culture, Routine    Meds ordered this encounter  Medications   rosuvastatin (CRESTOR) 5 MG tablet    Sig: Take 1 tablet (5 mg total) by mouth daily.    Dispense:  90 tablet    Refill:  3    This patient was seen by Drema Dallas, PA-C in collaboration with Dr. Clayborn Bigness as a part of collaborative care agreement.  Total time spent:35 Minutes  Time spent includes review of chart, medications, test results, and follow up plan with the patient.     Lavera Guise, MD  Internal Medicine

## 2021-09-21 ENCOUNTER — Telehealth: Payer: Self-pay

## 2021-09-21 NOTE — Telephone Encounter (Signed)
Patient was seen on 09/18/21. She would like a call back with her cholesterol level-Toni

## 2021-09-25 ENCOUNTER — Telehealth: Payer: Self-pay

## 2021-09-25 LAB — UA/M W/RFLX CULTURE, ROUTINE
Bilirubin, UA: NEGATIVE
Glucose, UA: NEGATIVE
Ketones, UA: NEGATIVE
Nitrite, UA: NEGATIVE
Protein,UA: NEGATIVE
RBC, UA: NEGATIVE
Specific Gravity, UA: 1.006 (ref 1.005–1.030)
Urobilinogen, Ur: 0.2 mg/dL (ref 0.2–1.0)
pH, UA: 8 — ABNORMAL HIGH (ref 5.0–7.5)

## 2021-09-25 LAB — URINE CULTURE, REFLEX

## 2021-09-25 LAB — MICROSCOPIC EXAMINATION
Casts: NONE SEEN /LPF
RBC, Urine: NONE SEEN /HPF (ref 0–2)

## 2021-09-25 NOTE — Telephone Encounter (Signed)
-----   Message from Jonetta Osgood, NP sent at 09/25/2021  8:29 AM EDT ----- Regarding: cholesterol Hi Alex,   Please call patient back, Her cholesterol levels have elevated some since that last time they were checked. Her total cholesterol is 262 and LDL is 189. Her HDL is 55 which is good. She is currently taking rosuvastatin (crestor) 5 mg daily. I would like to increase her dose to 10 mg if she is agreeable to that to help her cholesterol levels come back down. She can also focus on eating leaner meats like chicken, [place], fish, bison and venison. Also when checking nutrition labels, she can focus on foods with lower saturated or trans fats because these are bad fats. Good fats are monounsaturated and polyunsaturated.   Thanks, Alyssa

## 2021-09-25 NOTE — Telephone Encounter (Signed)
Spoke to pt, informed her of results and discussed foods and what to look of on the labels. Pt said she wanted to discuss hardening of the arteries at her next visit

## 2021-09-27 ENCOUNTER — Other Ambulatory Visit: Payer: Self-pay

## 2021-09-27 ENCOUNTER — Ambulatory Visit: Payer: Medicare HMO

## 2021-09-27 DIAGNOSIS — R0989 Other specified symptoms and signs involving the circulatory and respiratory systems: Secondary | ICD-10-CM

## 2021-09-27 DIAGNOSIS — E782 Mixed hyperlipidemia: Secondary | ICD-10-CM

## 2021-10-02 ENCOUNTER — Telehealth: Payer: Self-pay

## 2021-10-02 ENCOUNTER — Other Ambulatory Visit: Payer: Self-pay | Admitting: Physician Assistant

## 2021-10-02 DIAGNOSIS — R300 Dysuria: Secondary | ICD-10-CM

## 2021-10-02 MED ORDER — NITROFURANTOIN MONOHYD MACRO 100 MG PO CAPS: ORAL_CAPSULE | ORAL | 0 refills | Status: DC

## 2021-10-02 NOTE — Telephone Encounter (Signed)
-----   Message from Mylinda Latina, PA-C sent at 10/02/2021  4:29 PM EDT ----- Please call pt and let her know I sent macrobid for her bc she has another UTI and for pt to f/u with urology for recurrent UTIs still

## 2021-10-02 NOTE — Telephone Encounter (Signed)
Pt informed of having another UTI and we sent macrobid to her pharmacy and for her to f/u with urology.

## 2021-10-06 ENCOUNTER — Telehealth: Payer: Self-pay | Admitting: Internal Medicine

## 2021-10-06 NOTE — Chronic Care Management (AMB) (Signed)
  Chronic Care Management   Note  10/06/2021 Name: JAHNIAH PALLAS MRN: [id number] DOB: 21-May-1946  KIERYN BURTIS is a 75 y.o. year old female who is a primary care patient of Lavera Guise, MD. I reached out to [name] by phone today in response to a referral sent by Ms. Dub Amis Tourigny's PCP, Lavera Guise, MD.   Ms. Mcneice was given information about Chronic Care Management services today including:  CCM service includes personalized support from designated clinical staff supervised by her physician, including individualized plan of care and coordination with other care providers 24/7 contact phone numbers for assistance for urgent and routine care needs. Service will only be billed when office clinical staff spend 20 minutes or more in a month to coordinate care. Only one practitioner may furnish and bill the service in a calendar month. The patient may stop CCM services at any time (effective at the end of the month) by phone call to the office staff.   Patient agreed to services and verbal consent obtained.   Follow up plan:  Tatjana Secretary/administrator

## 2021-10-08 NOTE — Procedures (Signed)
[place], [place] [id number]  DATE OF SERVICE: September 27, 2021  CAROTID DOPPLER INTERPRETATION:  Bilateral Carotid Ultrsasound and Color Doppler Examination was performed. The RIGHT CCA shows mild plaque in the vessel. The LEFT CCA shows mild plaque in the vessel. There was no significant intimal thickening noted in the RIGHT carotid artery. There was no significant intimal thickening in the LEFT carotid artery.  The RIGHT CCA shows peak systolic velocity of 64 cm per second. The end diastolic velocity is 14 cm per second on the RIGHT side. The RIGHT ICA shows peak systolic velocity of 81 per second. RIGHT sided ICA end diastolic velocity is 21 cm per second. The RIGHT ECA shows a peak systolic velocity of 383 cm per second. The ICA/CCA ratio is calculated to be 1.3. This suggests less than 50% stenosis. The Vertebral Artery shows antegrade flow.  The LEFT CCA shows peak systolic velocity of 64 cm per second. The end diastolic velocity is 12 cm per second on the LEFT side. The LEFT ICA shows peak systolic velocity of 33.8 per second. LEFT sided ICA end diastolic velocity is 23 cm per second. The LEFT ECA shows a peak systolic velocity of 32.9 cm per second. The ICA/CCA ratio is calculated to be 1.3. This suggests less than 50% stenosis. The Vertebral Artery shows antegrade flow.   Impression:    The RIGHT CAROTID shows less than 50% stenosis. The LEFT CAROTID shows less than 50% stenosis.  There is mild plaque formation noted on the LEFT and mild plaque on the RIGHT  side. Consider a repeat Carotid doppler if clinical situation and symptoms warrant in 6-12 months. Patient should be encouraged to change lifestyles such as smoking cessation, regular exercise and dietary modification. Use of statins in the right clinical setting and ASA is encouraged.  Allyne Gee, MD [facility] Pulmonary Critical Care Medicine

## 2021-10-23 ENCOUNTER — Other Ambulatory Visit: Payer: Self-pay

## 2021-10-23 ENCOUNTER — Encounter: Payer: Self-pay | Admitting: Physician Assistant

## 2021-10-23 ENCOUNTER — Ambulatory Visit (INDEPENDENT_AMBULATORY_CARE_PROVIDER_SITE_OTHER): Payer: Medicare HMO | Admitting: Physician Assistant

## 2021-10-23 ENCOUNTER — Ambulatory Visit: Payer: Medicare HMO | Admitting: Urology

## 2021-10-23 ENCOUNTER — Ambulatory Visit: Payer: Medicare HMO | Admitting: Physician Assistant

## 2021-10-23 VITALS — BP 132/69 | HR 70 | Ht <= 58 in | Wt 135.0 lb

## 2021-10-23 VITALS — BP 120/79 | HR 75 | Temp 98.2°F | Resp 16 | Ht <= 58 in | Wt 142.0 lb

## 2021-10-23 DIAGNOSIS — I10 Essential (primary) hypertension: Secondary | ICD-10-CM

## 2021-10-23 DIAGNOSIS — Z87440 Personal history of urinary (tract) infections: Secondary | ICD-10-CM | POA: Diagnosis not present

## 2021-10-23 DIAGNOSIS — E782 Mixed hyperlipidemia: Secondary | ICD-10-CM | POA: Diagnosis not present

## 2021-10-23 DIAGNOSIS — R8271 Bacteriuria: Secondary | ICD-10-CM | POA: Diagnosis not present

## 2021-10-23 MED ORDER — PREMARIN 0.625 MG/GM VA CREA: TOPICAL_CREAM | VAGINAL | 4 refills | Status: DC

## 2021-10-23 NOTE — Progress Notes (Signed)
[facility] [place], [place] [address]  Internal MEDICINE  Office Visit Note  Patient Name: Cynthia Keith  [id number]  [id number]  Date of Service: 10/24/2021  Chief Complaint  Patient presents with   Follow-up    Review [place], pain in groin area right side    HPI Pt is here for routine follow up to review carotid US -Carotid US showed less than 50% stenosis bilaterally with mild plaque formation. She is tolerating taking 10mg  crestor now (taking 5mg  BID rather than at the same time) -Saw urology this morning, was recommended to try estrogen cream to help with recurrent UTI.  -Has her repeat US of kidney in Jan with urology as well -She is otherwise doing well and her BP is much better controlled  Current Medication: Outpatient Encounter Medications as of 10/23/2021  Medication Sig   amLODipine (NORVASC) 10 MG tablet TAKE 1 TABLET BY MOUTH EVERY DAY   aspirin EC 81 MG tablet Take 81 mg by mouth daily.   clobetasol (TEMOVATE) 0.05 % external solution Patient to mix in jar of CeraVe cream. Apply 1-2 times a day as needed for itchy rash. Avoid face, groin, underarms.   hydrochlorothiazide (HYDRODIURIL) 12.5 MG tablet Take 1 tablet (12.5 mg total) by mouth daily.   mirabegron ER (MYRBETRIQ) 25 MG TB24 tablet Take 1 tablet (25 mg total) by mouth daily.   Multiple Vitamin (MULTIVITAMIN) tablet Take 1 tablet by mouth daily.   omeprazole (PRILOSEC) 20 MG capsule Take 20 mg by mouth daily.   rosuvastatin (CRESTOR) 5 MG tablet Take 1 tablet (5 mg total) by mouth daily.   SHINGRIX injection    [DISCONTINUED] conjugated estrogens (PREMARIN) vaginal cream Apply one pea-sized amount around the opening of the urethra daily for 2 weeks, then 3 times weekly moving forward.   No facility-administered encounter medications on file as of 10/23/2021.    Surgical History: Past Surgical History:  Procedure Laterality Date   ABDOMINAL HYSTERECTOMY     COLONOSCOPY WITH  PROPOFOL N/A 12/12/2015   Procedure: COLONOSCOPY WITH PROPOFOL;  Surgeon: Manya Silvas, MD;  [facility] ENDOSCOPY;  Service: Endoscopy;  Laterality: N/A;   HAND SURGERY  2005    Medical History: Past Medical History:  Diagnosis Date   Actinic keratosis 03/15/2021   L post thigh    Basal cell carcinoma 06/21/2021   L alar crease, pt sched for EDC   GERD (gastroesophageal reflux disease)    Hyperlipidemia    Hypertension    Plantar fasciitis    Sleep apnea    Urinary tract infection     Family History: Family History  Problem Relation Age of Onset   Breast cancer Cousin    Stroke Mother    Heart disease Paternal Grandmother    Cancer Paternal Grandfather    Stroke Sister    Stroke Brother     Social History   Socioeconomic History   Marital status: Married    Spouse name: Not on file   Number of children: Not on file   Years of education: Not on file   Highest education level: Not on file  Occupational History   Not on file  Tobacco Use   Smoking status: Never   Smokeless tobacco: Never  Vaping Use   Vaping Use: Never used  Substance and Sexual Activity   Alcohol use: Yes    Comment: ocassionally   Drug use: No   Sexual activity: Yes    Birth control/protection: Post-menopausal,  Surgical  Other Topics Concern   Not on file  Social History Narrative   Not on file   Social Determinants of Health   Financial Resource Strain: Not on file  Food Insecurity: Not on file  Transportation Needs: Not on file  Physical Activity: Not on file  Stress: Not on file  Social Connections: Not on file  Intimate Partner Violence: Not on file      Review of Systems  Constitutional:  Negative for chills, fatigue and unexpected weight change.  HENT:  Negative for congestion, postnasal drip, rhinorrhea, sneezing and sore throat.   Eyes:  Negative for redness.  Respiratory:  Negative for cough, chest tightness and shortness of breath.   Cardiovascular:  Negative  for chest pain and palpitations.  Gastrointestinal:  Negative for abdominal pain, constipation, diarrhea, nausea and vomiting.  Genitourinary:  Negative for dysuria and frequency.  Musculoskeletal:  Negative for arthralgias, back pain, joint swelling and neck pain.  Skin:  Negative for rash.  Neurological: Negative.  Negative for tremors and numbness.  Hematological:  Negative for adenopathy. Does not bruise/bleed easily.  Psychiatric/Behavioral:  Negative for behavioral problems (Depression), sleep disturbance and suicidal ideas. The patient is not nervous/anxious.    Vital Signs: BP 120/79   Pulse 75   Temp 98.2 F (36.8 C)   Resp 16   Ht 4\' 8"  (1.422 m)   Wt 142 lb (64.4 kg)   SpO2 98%   BMI 31.84 kg/m    Physical Exam Vitals and nursing note reviewed.  Constitutional:      General: She is not in acute distress.    Appearance: She is well-developed. She is obese. She is not diaphoretic.  HENT:     Head: Normocephalic and atraumatic.     Mouth/Throat:     Pharynx: No oropharyngeal exudate.  Eyes:     Pupils: Pupils are equal, round, and reactive to light.  Neck:     Thyroid: No thyromegaly.     Vascular: No JVD.     Trachea: No tracheal deviation.  Cardiovascular:     Rate and Rhythm: Normal rate and regular rhythm.     Heart sounds: Normal heart sounds. No murmur heard.   No friction rub. No gallop.  Pulmonary:     Effort: Pulmonary effort is normal. No respiratory distress.     Breath sounds: No wheezing or rales.  Chest:     Chest wall: No tenderness.  Abdominal:     General: Bowel sounds are normal.     Palpations: Abdomen is soft.  Musculoskeletal:        General: Normal range of motion.     Cervical back: Normal range of motion and neck supple.  Lymphadenopathy:     Cervical: No cervical adenopathy.  Skin:    General: Skin is warm and dry.  Neurological:     Mental Status: She is alert and oriented to person, place, and time.     Cranial Nerves: No  cranial nerve deficit.  Psychiatric:        Behavior: Behavior normal.        Thought Content: Thought content normal.        Judgment: Judgment normal.       Assessment/Plan: 1. Essential hypertension Well controlled, continue current medications  2. Mixed hyperlipidemia Tolerating crestor at 5mg  BID and will continue this, carotid US did not show significant stenosis but did have mild plaque, will continue to monitor   General Counseling: Onyinyechi verbalizes understanding  of the findings of todays visit and agrees with plan of treatment. I have discussed any further diagnostic evaluation that may be needed or ordered today. We also reviewed her medications today. she has been encouraged to call the office with any questions or concerns that should arise related to todays visit.    No orders of the defined types were placed in this encounter.   No orders of the defined types were placed in this encounter.   This patient was seen by Drema [place], PA-C in collaboration with Dr. Clayborn Bigness as a part of collaborative care agreement.   Total time spent:30 Minutes Time spent includes review of chart, medications, test results, and follow up plan with the patient.      Dr Lavera Guise Internal medicine

## 2021-10-23 NOTE — Progress Notes (Signed)
10/23/2021 1:47 PM   Cynthia Keith 02-May-1946 [id number]  CC: Chief Complaint  Patient presents with   Recurrent UTI   HPI: Cynthia Keith is a 75 y.o. female with PMH pelvic organ prolapse s/p robotic colpopexy, cystourethroscopy, and bladder sling with Dr. Salome Holmes at [facility] on 01/07/2015; right renal mass on surveillance; recurrent UTI on cranberry supplements; and OAB wet who previously responded well on Myrbetriq 25 mg daily who presents today for recurrent UTI follow-up per her PCP.   She saw her PCP on 09/18/2021.  She reported her baseline urgency/frequency at that time, but denied dysuria.  A UA was notable for 2+ leukocytes with urine microscopy revealing 6-10 WBCs/hpf.  Urine culture resulted with tetracycline resistant E faecalis and she was treated with 10 days of Macrobid.  Today she reports having completed antibiotics as prescribed, however she never developed bothersome dysuria consistent with any of her past UTIs.  She denies dysuria today.  She does not have a history of breast or ovarian cancer.  In-office UA today positive for trace intact blood and 2+ leukocyte esterase; urine microscopy with >30 WBCs/HPF and moderate bacteria.   PMH: Past Medical History:  Diagnosis Date   Actinic keratosis 03/15/2021   L post thigh    Basal cell carcinoma 06/21/2021   L alar crease, pt sched for EDC   GERD (gastroesophageal reflux disease)    Hyperlipidemia    Hypertension    Plantar fasciitis    Sleep apnea    Urinary tract infection     Surgical History: Past Surgical History:  Procedure Laterality Date   ABDOMINAL HYSTERECTOMY     COLONOSCOPY WITH PROPOFOL N/A 12/12/2015   Procedure: COLONOSCOPY WITH PROPOFOL;  Surgeon: Manya Silvas, MD;  Location: [place];  Service: Endoscopy;  Laterality: N/A;   HAND SURGERY  2005    Home Medications:  Allergies as of 10/23/2021       Reactions   Iodinated Diagnostic Agents Anaphylaxis   Remote hx of respiratory  arrest after IV iodine for kidney study. Immediately treated w/o sequela. No previous or subsequent IVP exposure. Remote hx of respiratory arrest after IV iodine for kidney study. Immediately treated w/o sequela. No previous or subsequent IVP exposure. Remote hx of respiratory arrest after IV iodine for kidney study. Immediately treated w/o sequela. No previous or subsequent IVP exposure.   Iodine Shortness Of Breath   "Quit Breathing"        Medication List        Accurate as of October 23, 2021  1:47 PM. If you have any questions, ask your nurse or doctor.          STOP taking these medications    nitrofurantoin (macrocrystal-monohydrate) 100 MG capsule Commonly known as: Macrobid Stopped by: Debroah Loop, PA-C       TAKE these medications    amLODipine 10 MG tablet Commonly known as: NORVASC TAKE 1 TABLET BY MOUTH EVERY DAY   aspirin EC 81 MG tablet Take 81 mg by mouth daily.   clobetasol 0.05 % external solution Commonly known as: TEMOVATE Patient to mix in jar of CeraVe cream. Apply 1-2 times a day as needed for itchy rash. Avoid face, groin, underarms.   hydrochlorothiazide 12.5 MG tablet Commonly known as: HYDRODIURIL Take 1 tablet (12.5 mg total) by mouth daily.   mirabegron ER 25 MG Tb24 tablet Commonly known as: MYRBETRIQ Take 1 tablet (25 mg total) by mouth daily.   multivitamin tablet Take 1 tablet  by mouth daily.   omeprazole 20 MG capsule Commonly known as: PRILOSEC Take 20 mg by mouth daily.   Premarin vaginal cream Generic drug: conjugated estrogens Apply one pea-sized amount around the opening of the urethra daily for 2 weeks, then 3 times weekly moving forward. Started by: Debroah Loop, PA-C   rosuvastatin 5 MG tablet Commonly known as: Crestor Take 1 tablet (5 mg total) by mouth daily.   Shingrix injection Generic drug: Zoster Vaccine Adjuvanted        Allergies:  Allergies  Allergen Reactions   Iodinated  Diagnostic Agents Anaphylaxis    Remote hx of respiratory arrest after IV iodine for kidney study. Immediately treated w/o sequela. No previous or subsequent IVP exposure. Remote hx of respiratory arrest after IV iodine for kidney study. Immediately treated w/o sequela. No previous or subsequent IVP exposure. Remote hx of respiratory arrest after IV iodine for kidney study. Immediately treated w/o sequela. No previous or subsequent IVP exposure.   Iodine Shortness Of Breath    "Quit Breathing"    Family History: Family History  Problem Relation Age of Onset   Breast cancer Cousin    Stroke Mother    Heart disease Paternal Grandmother    Cancer Paternal Grandfather    Stroke Sister    Stroke Brother     Social History:   reports that she has never smoked. She has never used smokeless tobacco. She reports current alcohol use. She reports that she does not use drugs.  Physical Exam: BP 132/69 (BP Location: Left Arm, Patient Position: Sitting, Cuff Size: Normal)   Pulse 70   Ht 4\' 8"  (1.422 m)   Wt 135 lb (61.2 kg)   BMI 30.27 kg/m   Constitutional:  Alert and oriented, no acute distress, nontoxic appearing HEENT: [place], AT Cardiovascular: No clubbing, cyanosis, or edema Respiratory: Normal respiratory effort, no increased work of breathing Skin: No rashes, bruises or suspicious lesions Neurologic: Grossly intact, no focal deficits, moving all 4 extremities Psychiatric: Normal mood and affect  Laboratory Data: Results for orders placed or performed in visit on 10/23/21  CULTURE, URINE COMPREHENSIVE   Specimen: Urine   UR  Result Value Ref Range   Urine Culture, Comprehensive Preliminary report    Organism ID, Bacteria Comment   Microscopic Examination   Urine  Result Value Ref Range   WBC, UA >30 (H) 0 - 5 /hpf   RBC 0-2 0 - 2 /hpf   Epithelial Cells (non renal) 0-10 0 - 10 /hpf   Bacteria, UA Moderate (A) None seen/Few  Urinalysis, Complete  Result Value Ref Range    Specific Gravity, UA 1.015 1.005 - 1.030   pH, UA 7.0 5.0 - 7.5   Color, UA Yellow Yellow   Appearance Ur Cloudy (A) Clear   Leukocytes,UA 2+ (A) Negative   Protein,UA Negative Negative/Trace   Glucose, UA Negative Negative   Ketones, UA Negative Negative   RBC, UA Trace (A) Negative   Bilirubin, UA Negative Negative   Urobilinogen, Ur 0.2 0.2 - 1.0 mg/dL   Nitrite, UA Negative Negative   Microscopic Examination See below:    Assessment & Plan:   1. Asymptomatic bacteriuria Urine culture last month was positive, however in the absence of acute infective symptoms.  We discussed the concept of urinary colonization and discussed that antibiotics are not necessary in the absence of acute infective symptoms.  She expressed understanding.  We will send urine for culture today, but will not treat unless she  develops acute dysuria or worsening in her baseline urgency/frequency. - Urinalysis, Complete - CULTURE, URINE COMPREHENSIVE  2. History of recurrent UTI (urinary tract infection) Given her history of recurrent UTI despite the above, I offered her vaginal estrogen cream for UTI prevention.  We discussed that this works through various mechanisms to reduce UTI frequency including bulking up the urogenital tissue, increasing lubrication, acidifying the environment, rebalancing the urogenital microbiota.  She is in agreement with this plan. - conjugated estrogens (PREMARIN) vaginal cream; Apply one pea-sized amount around the opening of the urethra daily for 2 weeks, then 3 times weekly moving forward.  Dispense: 30 g; Refill: 4  Return if symptoms worsen or fail to improve.  Debroah Loop, PA-C  [facility] [address], [place], [place] [address] [phone]

## 2021-10-24 ENCOUNTER — Telehealth: Payer: Self-pay | Admitting: Physician Assistant

## 2021-10-24 DIAGNOSIS — Z87440 Personal history of urinary (tract) infections: Secondary | ICD-10-CM

## 2021-10-24 MED ORDER — ESTRADIOL 0.1 MG/GM VA CREA: TOPICAL_CREAM | VAGINAL | 12 refills | Status: DC

## 2021-10-24 NOTE — Telephone Encounter (Signed)
Patient notified, voiced understanding about calling office back should Estrace cream be too expensive.

## 2021-10-24 NOTE — Telephone Encounter (Signed)
Pt went to pick up RX at [facility] in [place] yesterday after appt.  She said it was going to cost $300 and she couldn't afford it.  She wasn't sure of the name of cream.

## 2021-10-24 NOTE — Telephone Encounter (Signed)
Premarin rx cancelled due to cost. I have sent in Estrace as an alternative. Please ask the patient to inquire re: cost from her pharmacy. If still cost prohibitive, can pursue compounded estrogen cream for her.

## 2021-10-25 LAB — MICROSCOPIC EXAMINATION: WBC, UA: >30 /hpf — ABNORMAL HIGH (ref 0–5)

## 2021-10-25 LAB — URINALYSIS, COMPLETE
Bilirubin, UA: NEGATIVE
Glucose, UA: NEGATIVE
Ketones, UA: NEGATIVE
Nitrite, UA: NEGATIVE
Protein,UA: NEGATIVE
Specific Gravity, UA: 1.015 (ref 1.005–1.030)
Urobilinogen, Ur: 0.2 mg/dL (ref 0.2–1.0)
pH, UA: 7 (ref 5.0–7.5)

## 2021-10-27 ENCOUNTER — Telehealth: Payer: Self-pay

## 2021-10-27 LAB — CULTURE, URINE COMPREHENSIVE

## 2021-10-27 NOTE — Telephone Encounter (Signed)
Patient states she is not experiencing any infective symptoms at this time. She has picked up her estrogen cream and is using as instructed.

## 2021-10-27 NOTE — Telephone Encounter (Signed)
-----   Message from Debroah Loop, [place] sent at 10/27/2021 12:40 PM EDT ----- Likely sample contaminant versus asymptomatic bacteriuria. I do not recommend treatment unless she is having acute infective symptoms. If she is, please start her on Augmentin BID x5 days. ----- Message ----- From: Lavone Neri Lab Results In Sent: 10/25/2021   7:37 AM EDT To: Debroah Loop, PA-C

## 2021-11-06 ENCOUNTER — Ambulatory Visit
Admission: RE | Admit: 2021-11-06 | Discharge: 2021-11-06 | Disposition: A | Discharge status: Home / Self Care | Payer: Medicare HMO | Attending: Physician Assistant | Admitting: Physician Assistant

## 2021-11-06 ENCOUNTER — Encounter: Payer: Self-pay | Admitting: Physician Assistant

## 2021-11-06 ENCOUNTER — Other Ambulatory Visit: Payer: Self-pay

## 2021-11-06 ENCOUNTER — Ambulatory Visit (INDEPENDENT_AMBULATORY_CARE_PROVIDER_SITE_OTHER): Payer: Medicare HMO | Admitting: Physician Assistant

## 2021-11-06 ENCOUNTER — Ambulatory Visit
Admission: RE | Admit: 2021-11-06 | Discharge: 2021-11-06 | Disposition: A | Discharge status: Home / Self Care | Payer: Medicare HMO | Source: Ambulatory Visit | Attending: Physician Assistant | Admitting: Physician Assistant

## 2021-11-06 VITALS — BP 150/74 | HR 73 | Temp 98.0°F | Resp 16 | Ht <= 58 in | Wt 141.0 lb

## 2021-11-06 DIAGNOSIS — W19XXXA Unspecified fall, initial encounter: Secondary | ICD-10-CM

## 2021-11-06 DIAGNOSIS — R0781 Pleurodynia: Secondary | ICD-10-CM

## 2021-11-06 DIAGNOSIS — Y939 Activity, unspecified: Secondary | ICD-10-CM | POA: Diagnosis not present

## 2021-11-06 DIAGNOSIS — X58XXXA Exposure to other specified factors, initial encounter: Secondary | ICD-10-CM | POA: Insufficient documentation

## 2021-11-06 DIAGNOSIS — Y929 Unspecified place or not applicable: Secondary | ICD-10-CM | POA: Insufficient documentation

## 2021-11-06 NOTE — Progress Notes (Signed)
[facility] [place], [place] [address]  Internal MEDICINE  Office Visit Note  Patient Name: Cynthia Keith  [id number]  [id number]  Date of Service: 11/08/2021  Chief Complaint  Patient presents with   Acute Visit    Pt fall 1 week ago and injured right side     HPI Pt is here for a sick visit. -Last Sunday at church, went to walk around someone and got caught on someones foot and fell. He sort of caught her so the impact wasn't as hard but did still hit. Went to massage therapist and had some soreness on right side along ribs. Fell on right arm and leg with other person catching her under her arm. Was not sore initially but the last few days has been more noticeable. Pain more so in and under breast on right side.  Feels pulling under arm when she tries to raise right arm but no pain in shoulder or arm itself. Denies any bruising -Discussed ordering a chest xray to make sure no rib fractures and trying to take it easy with Tylenol as needed  Current Medication:  Outpatient Encounter Medications as of 11/06/2021  Medication Sig   amLODipine (NORVASC) 10 MG tablet TAKE 1 TABLET BY MOUTH EVERY DAY   aspirin EC 81 MG tablet Take 81 mg by mouth daily.   clobetasol (TEMOVATE) 0.05 % external solution Patient to mix in jar of CeraVe cream. Apply 1-2 times a day as needed for itchy rash. Avoid face, groin, underarms.   estradiol (ESTRACE) 0.1 MG/GM vaginal cream Apply one pea-sized amount around the opening of the urethra daily for 2 weeks, then 3 times weekly thereafter.   hydrochlorothiazide (HYDRODIURIL) 12.5 MG tablet Take 1 tablet (12.5 mg total) by mouth daily.   mirabegron ER (MYRBETRIQ) 25 MG TB24 tablet Take 1 tablet (25 mg total) by mouth daily.   Multiple Vitamin (MULTIVITAMIN) tablet Take 1 tablet by mouth daily.   omeprazole (PRILOSEC) 20 MG capsule Take 20 mg by mouth daily.   rosuvastatin (CRESTOR) 5 MG tablet Take 1 tablet (5 mg total) by mouth  daily.   SHINGRIX injection    No facility-administered encounter medications on file as of 11/06/2021.      Medical History: Past Medical History:  Diagnosis Date   Actinic keratosis 03/15/2021   L post thigh    Basal cell carcinoma 06/21/2021   L alar crease, pt sched for EDC   GERD (gastroesophageal reflux disease)    Hyperlipidemia    Hypertension    Plantar fasciitis    Sleep apnea    Urinary tract infection      Vital Signs: BP (!) 150/74   Pulse 73   Temp 98 F (36.7 C)   Resp 16   Ht 4\' 8"  (1.422 m)   Wt 141 lb (64 kg)   SpO2 98%   BMI 31.61 kg/m    Review of Systems  Constitutional:  Negative for fatigue and fever.  HENT:  Negative for congestion, mouth sores and postnasal drip.   Respiratory:  Negative for cough.   Cardiovascular:  Negative for chest pain.  Genitourinary:  Negative for flank pain.  Musculoskeletal:  Positive for myalgias.       Right sided soreness  Psychiatric/Behavioral: Negative.     Physical Exam Vitals and nursing note reviewed.  Constitutional:      General: She is not in acute distress.    Appearance: She is well-developed. She is obese. She  is not diaphoretic.  HENT:     Head: Normocephalic and atraumatic.     Mouth/Throat:     Pharynx: No oropharyngeal exudate.  Eyes:     Pupils: Pupils are equal, round, and reactive to light.  Neck:     Thyroid: No thyromegaly.     Vascular: No JVD.     Trachea: No tracheal deviation.  Cardiovascular:     Rate and Rhythm: Normal rate and regular rhythm.     Heart sounds: Normal heart sounds. No murmur heard.   No friction rub. No gallop.  Pulmonary:     Effort: Pulmonary effort is normal. No respiratory distress.     Breath sounds: No wheezing or rales.  Chest:     Chest wall: No tenderness.  Abdominal:     General: Bowel sounds are normal.     Palpations: Abdomen is soft.  Musculoskeletal:        General: Tenderness present. Normal range of motion.     Cervical back:  Normal range of motion and neck supple.     Comments: Right sided tenderness under right breast along ribs, denies any TTP in shoulder or hip  Lymphadenopathy:     Cervical: No cervical adenopathy.  Skin:    General: Skin is warm and dry.  Neurological:     Mental Status: She is alert and oriented to person, place, and time.     Cranial Nerves: No cranial nerve deficit.  Psychiatric:        Behavior: Behavior normal.        Thought Content: Thought content normal.        Judgment: Judgment normal.      Assessment/Plan: 1. Rib pain on right side Will check xray to ensure no fracture, may take tylenol as needed - DG Chest 2 View; Future  2. Fall, initial encounter May take tylenol as needed and advised to take it easy. If worsening symptoms will follow up and may need ortho eval - DG Chest 2 View; Future   General Counseling: elvia aydin understanding of the findings of todays visit and agrees with plan of treatment. I have discussed any further diagnostic evaluation that may be needed or ordered today. We also reviewed her medications today. she has been encouraged to call the office with any questions or concerns that should arise related to todays visit.    Counseling:    Orders Placed This Encounter  Procedures   DG Chest 2 View    No orders of the defined types were placed in this encounter.   Time spent:30 Minutes

## 2021-11-07 ENCOUNTER — Telehealth: Payer: Self-pay

## 2021-11-07 NOTE — Telephone Encounter (Signed)
Spoke to pt and informed her that her xray looked good.

## 2021-11-07 NOTE — Telephone Encounter (Signed)
-----   Message from Mylinda Latina, PA-C sent at 11/07/2021 11:33 AM EST ----- Please let her know that her chest xray looked good.

## 2021-11-10 ENCOUNTER — Telehealth: Payer: Self-pay

## 2021-11-10 DIAGNOSIS — R300 Dysuria: Secondary | ICD-10-CM | POA: Diagnosis not present

## 2021-11-10 DIAGNOSIS — R059 Cough, unspecified: Secondary | ICD-10-CM | POA: Diagnosis not present

## 2021-11-10 DIAGNOSIS — N3001 Acute cystitis with hematuria: Secondary | ICD-10-CM | POA: Diagnosis not present

## 2021-11-10 DIAGNOSIS — U071 COVID-19: Secondary | ICD-10-CM | POA: Diagnosis not present

## 2021-11-10 NOTE — Telephone Encounter (Signed)
Pt called that she is having sinus infection and fever 100 advised her to go to urgent care they will check for covid and flu she said she will go to urgent care

## 2021-11-13 ENCOUNTER — Telehealth: Payer: Self-pay

## 2021-11-13 ENCOUNTER — Other Ambulatory Visit: Payer: Self-pay | Admitting: Nurse Practitioner

## 2021-11-13 MED ORDER — PREDNISONE 10 MG PO TABS: ORAL_TABLET | ORAL | 0 refills | Status: DC

## 2021-11-13 MED ORDER — AZITHROMYCIN 250 MG PO TABS: ORAL_TABLET | ORAL | 0 refills | Status: DC

## 2021-11-13 NOTE — Telephone Encounter (Signed)
Send pres to

## 2021-11-13 NOTE — Telephone Encounter (Signed)
Pt called that she tested positive for Covid she went to urgent care they gave her Macrobid as per alyssa advised her stopped that and we send azithromycin for 10 days and send prednisone advised her if she is not feeling better she need go to ed

## 2021-12-04 ENCOUNTER — Other Ambulatory Visit: Payer: Self-pay | Admitting: Nurse Practitioner

## 2021-12-05 ENCOUNTER — Encounter: Payer: Self-pay | Admitting: Physician Assistant

## 2021-12-05 ENCOUNTER — Other Ambulatory Visit: Payer: Self-pay

## 2021-12-05 ENCOUNTER — Ambulatory Visit: Payer: Medicare HMO | Admitting: Physician Assistant

## 2021-12-05 VITALS — BP 171/76 | HR 76 | Ht <= 58 in | Wt 137.0 lb

## 2021-12-05 DIAGNOSIS — N390 Urinary tract infection, site not specified: Secondary | ICD-10-CM | POA: Diagnosis not present

## 2021-12-05 DIAGNOSIS — R300 Dysuria: Secondary | ICD-10-CM | POA: Diagnosis not present

## 2021-12-05 LAB — URINALYSIS, COMPLETE
Bilirubin, UA: NEGATIVE
Glucose, UA: NEGATIVE
Ketones, UA: NEGATIVE
Nitrite, UA: NEGATIVE
Protein,UA: NEGATIVE
RBC, UA: NEGATIVE
Specific Gravity, UA: 1.01 (ref 1.005–1.030)
Urobilinogen, Ur: 0.2 mg/dL (ref 0.2–1.0)
pH, UA: 5.5 (ref 5.0–7.5)

## 2021-12-05 LAB — MICROSCOPIC EXAMINATION: WBC, UA: >30 /hpf — ABNORMAL HIGH (ref 0–5)

## 2021-12-05 MED ORDER — AMOXICILLIN 875 MG PO TABS: 875.0000 mg | ORAL_TABLET | Freq: Two times a day (BID) | ORAL | 0 refills | Status: AC

## 2021-12-05 NOTE — Progress Notes (Signed)
12/05/2021 1:55 PM   Cynthia Keith 04-25-1946 [id number]  CC: Chief Complaint  Patient presents with   Dysuria   HPI: Cynthia Keith is a 75 y.o. female with PMH pelvic organ prolapse s/p robotic colpopexy, cystourethroscopy, and bladder sling with Dr. Gaspar Cola on 01/07/2015; right renal mass on surveillance; recurrent UTI on cranberry supplements and topical vaginal estrogen cream, and OAB wet who previously responded well on Myrbetriq 25 mg daily who presents today for evaluation of possible UTI.   I saw her in clinic most recently on 10/23/2021 for follow-up of recurrent UTIs per her PCP.  She was not clinically infected at that time and her urine culture grew Streptococcus gallolyticus.  We deferred therapy in the absence of clinical infection.  Today she reports she has had some lower abdominal pressure and dysuria as well as frequency and urgency for the past several weeks.  She is no longer taking Myrbetriq but she is using vaginal estrogen cream.  In-office UA today positive for 2+ leukocyte esterase; urine microscopy with >30 WBCs/HPF and moderate bacteria.  PMH: Past Medical History:  Diagnosis Date   Actinic keratosis 03/15/2021   L post thigh    Basal cell carcinoma 06/21/2021   L alar crease, pt sched for EDC   GERD (gastroesophageal reflux disease)    Hyperlipidemia    Hypertension    Plantar fasciitis    Sleep apnea    Urinary tract infection     Surgical History: Past Surgical History:  Procedure Laterality Date   ABDOMINAL HYSTERECTOMY     COLONOSCOPY WITH PROPOFOL N/A 12/12/2015   Procedure: COLONOSCOPY WITH PROPOFOL;  Surgeon: Manya Silvas, MD;  Location: [facility];  Service: Endoscopy;  Laterality: N/A;   HAND SURGERY  2005    Home Medications:  Allergies as of 12/05/2021       Reactions   Iodinated Diagnostic Agents Anaphylaxis   Remote hx of respiratory arrest after IV iodine for kidney study. Immediately treated w/o sequela. No  previous or subsequent IVP exposure. Remote hx of respiratory arrest after IV iodine for kidney study. Immediately treated w/o sequela. No previous or subsequent IVP exposure. Remote hx of respiratory arrest after IV iodine for kidney study. Immediately treated w/o sequela. No previous or subsequent IVP exposure.   Iodine Shortness Of Breath   "Quit Breathing"        Medication List        Accurate as of December 05, 2021  1:55 PM. If you have any questions, ask your nurse or doctor.          STOP taking these medications    azithromycin 250 MG tablet Commonly known as: ZITHROMAX Stopped by: Debroah Loop, PA-C       TAKE these medications    amLODipine 10 MG tablet Commonly known as: NORVASC TAKE 1 TABLET BY MOUTH EVERY DAY   amoxicillin 875 MG tablet Commonly known as: AMOXIL Take 1 tablet (875 mg total) by mouth 2 (two) times daily for 5 days. Started by: Debroah Loop, PA-C   aspirin EC 81 MG tablet Take 81 mg by mouth daily.   clobetasol 0.05 % external solution Commonly known as: TEMOVATE Patient to mix in jar of CeraVe cream. Apply 1-2 times a day as needed for itchy rash. Avoid face, groin, underarms.   estradiol 0.1 MG/GM vaginal cream Commonly known as: ESTRACE Apply one pea-sized amount around the opening of the urethra daily for 2 weeks, then 3 times weekly thereafter.  hydrochlorothiazide 12.5 MG tablet Commonly known as: HYDRODIURIL Take 1 tablet (12.5 mg total) by mouth daily.   mirabegron ER 25 MG Tb24 tablet Commonly known as: MYRBETRIQ Take 1 tablet (25 mg total) by mouth daily.   multivitamin tablet Take 1 tablet by mouth daily.   omeprazole 20 MG capsule Commonly known as: PRILOSEC Take 20 mg by mouth daily.   predniSONE 10 MG tablet Commonly known as: DELTASONE Take 1 tab po 3 x day for 3 days then take 1 tab po 2 x a day for 3 days and then take 1 tab po daily for 3 days   rosuvastatin 5 MG tablet Commonly  known as: Crestor Take 1 tablet (5 mg total) by mouth daily.   Shingrix injection Generic drug: Zoster Vaccine Adjuvanted        Allergies:  Allergies  Allergen Reactions   Iodinated Diagnostic Agents Anaphylaxis    Remote hx of respiratory arrest after IV iodine for kidney study. Immediately treated w/o sequela. No previous or subsequent IVP exposure. Remote hx of respiratory arrest after IV iodine for kidney study. Immediately treated w/o sequela. No previous or subsequent IVP exposure. Remote hx of respiratory arrest after IV iodine for kidney study. Immediately treated w/o sequela. No previous or subsequent IVP exposure.   Iodine Shortness Of Breath    "Quit Breathing"    Family History: Family History  Problem Relation Age of Onset   Breast cancer Cousin    Stroke Mother    Heart disease Paternal Grandmother    Cancer Paternal Grandfather    Stroke Sister    Stroke Brother     Social History:   reports that she has never smoked. She has never used smokeless tobacco. She reports current alcohol use. She reports that she does not use drugs.  Physical Exam: BP (!) 171/76    Pulse 76    Ht 4\' 8"  (1.422 m)    Wt 137 lb (62.1 kg)    BMI 30.71 kg/m   Constitutional:  Alert and oriented, no acute distress, nontoxic appearing HEENT: [place], AT Cardiovascular: No clubbing, cyanosis, or edema Respiratory: Normal respiratory effort, no increased work of breathing Skin: No rashes, bruises or suspicious lesions Neurologic: Grossly intact, no focal deficits, moving all 4 extremities Psychiatric: Normal mood and affect  Laboratory Data: Results for orders placed or performed in visit on 12/05/21  Microscopic Examination   Urine  Result Value Ref Range   WBC, UA >30 (H) 0 - 5 /hpf   RBC 0-2 0 - 2 /hpf   Epithelial Cells (non renal) 0-10 0 - 10 /hpf   Renal Epithel, UA 0-10 (A) None seen /hpf   Bacteria, UA Moderate (A) None seen/Few  Urinalysis, Complete  Result Value Ref  Range   Specific Gravity, UA 1.010 1.005 - 1.030   pH, UA 5.5 5.0 - 7.5   Color, UA Yellow Yellow   Appearance Ur Clear Clear   Leukocytes,UA 2+ (A) Negative   Protein,UA Negative Negative/Trace   Glucose, UA Negative Negative   Ketones, UA Negative Negative   RBC, UA Negative Negative   Bilirubin, UA Negative Negative   Urobilinogen, Ur 0.2 0.2 - 1.0 mg/dL   Nitrite, UA Negative Negative   Microscopic Examination See below:    Assessment & Plan:   1. Recurrent UTI UA today notable for pyuria and bacteriuria.  Will start empiric amoxicillin and send for culture for further evaluation.  If her culture is negative, we discussed consideration of  cystoscopy. - Urinalysis, Complete - CULTURE, URINE COMPREHENSIVE - amoxicillin (AMOXIL) 875 MG tablet; Take 1 tablet (875 mg total) by mouth 2 (two) times daily for 5 days.  Dispense: 10 tablet; Refill: 0  Return if symptoms worsen or fail to improve.  Debroah Loop, PA-C  [facility] [address] [place], [place] [address] [phone]

## 2021-12-06 ENCOUNTER — Telehealth: Payer: Medicare HMO

## 2021-12-07 ENCOUNTER — Telehealth: Payer: Self-pay

## 2021-12-07 ENCOUNTER — Other Ambulatory Visit: Payer: Self-pay

## 2021-12-07 MED ORDER — OMEPRAZOLE 20 MG PO CPDR
20.0000 mg | DELAYED_RELEASE_CAPSULE | Freq: Every day | ORAL | 1 refills | Status: DC
Start: 2021-12-07 — End: 2022-05-30

## 2021-12-09 NOTE — Telephone Encounter (Signed)
error 

## 2021-12-13 ENCOUNTER — Telehealth: Payer: Self-pay

## 2021-12-13 DIAGNOSIS — N390 Urinary tract infection, site not specified: Secondary | ICD-10-CM

## 2021-12-13 LAB — CULTURE, URINE COMPREHENSIVE

## 2021-12-13 MED ORDER — AMOXICILLIN-POT CLAVULANATE 875-125 MG PO TABS: 1.0000 | ORAL_TABLET | Freq: Two times a day (BID) | ORAL | 0 refills | Status: AC

## 2021-12-13 NOTE — Telephone Encounter (Signed)
Notified patient as advised, patient expressed understanding. Abx sent to pharm.

## 2021-12-13 NOTE — Telephone Encounter (Signed)
-----   Message from Debroah Loop, [place] sent at 12/12/2021  4:39 PM EST ----- Please switch her from amoxicillin to Augmentin BID x5 days. ----- Message ----- From: Lavone Neri Lab Results In Sent: 12/05/2021   1:36 PM EST To: Debroah Loop, PA-C

## 2021-12-27 ENCOUNTER — Telehealth: Payer: Medicare HMO

## 2021-12-28 ENCOUNTER — Other Ambulatory Visit: Payer: Self-pay | Admitting: *Deleted

## 2021-12-28 DIAGNOSIS — R7989 Other specified abnormal findings of blood chemistry: Secondary | ICD-10-CM

## 2022-01-01 DIAGNOSIS — H18593 Other hereditary corneal dystrophies, bilateral: Secondary | ICD-10-CM | POA: Diagnosis not present

## 2022-01-01 DIAGNOSIS — Z961 Presence of intraocular lens: Secondary | ICD-10-CM | POA: Diagnosis not present

## 2022-01-05 ENCOUNTER — Inpatient Hospital Stay: Payer: Medicare HMO

## 2022-01-08 ENCOUNTER — Inpatient Hospital Stay: Payer: Medicare HMO | Admitting: Nurse Practitioner

## 2022-01-08 ENCOUNTER — Telehealth: Payer: Self-pay | Admitting: Nurse Practitioner

## 2022-01-08 NOTE — Telephone Encounter (Signed)
Pt called and states that she forgot that she had appt on 1-13 for labs .Just seeing appt for today. Please give her a call back at [phone].

## 2022-01-19 DIAGNOSIS — N390 Urinary tract infection, site not specified: Secondary | ICD-10-CM | POA: Diagnosis not present

## 2022-01-19 DIAGNOSIS — R300 Dysuria: Secondary | ICD-10-CM | POA: Diagnosis not present

## 2022-01-22 ENCOUNTER — Inpatient Hospital Stay: Payer: Medicare HMO

## 2022-01-22 ENCOUNTER — Other Ambulatory Visit: Payer: Self-pay

## 2022-01-22 ENCOUNTER — Inpatient Hospital Stay: Payer: Medicare HMO | Attending: Nurse Practitioner | Admitting: Nurse Practitioner

## 2022-01-22 ENCOUNTER — Encounter: Payer: Self-pay | Admitting: Nurse Practitioner

## 2022-01-22 DIAGNOSIS — K760 Fatty (change of) liver, not elsewhere classified: Secondary | ICD-10-CM | POA: Diagnosis not present

## 2022-01-22 DIAGNOSIS — R7989 Other specified abnormal findings of blood chemistry: Secondary | ICD-10-CM

## 2022-01-22 LAB — COMPREHENSIVE METABOLIC PANEL
ALT: 28 U/L (ref 0–44)
AST: 34 U/L (ref 15–41)
Albumin: 4.4 g/dL (ref 3.5–5.0)
Alkaline Phosphatase: 63 U/L (ref 38–126)
Anion gap: 10 (ref 5–15)
BUN: 18 mg/dL (ref 8–23)
CO2: 26 mmol/L (ref 22–32)
Calcium: 10.1 mg/dL (ref 8.9–10.3)
Chloride: 98 mmol/L (ref 98–111)
Creatinine, Ser: 0.62 mg/dL (ref 0.44–1.00)
GFR, Estimated: >60 mL/min (ref >60)
Glucose, Bld: 105 mg/dL — ABNORMAL HIGH (ref 70–99)
Potassium: 3.3 mmol/L — ABNORMAL LOW (ref 3.5–5.1)
Sodium: 134 mmol/L — ABNORMAL LOW (ref 135–145)
Total Bilirubin: 0.6 mg/dL (ref 0.3–1.2)
Total Protein: 8.1 g/dL (ref 6.5–8.1)

## 2022-01-22 LAB — CBC WITH DIFFERENTIAL/PLATELET
Abs Immature Granulocytes: 0.02 10*3/uL (ref 0.00–0.07)
Basophils Absolute: 0.1 10*3/uL (ref 0.0–0.1)
Basophils Relative: 1 %
Eosinophils Absolute: 0.3 10*3/uL (ref 0.0–0.5)
Eosinophils Relative: 3 %
HCT: 43 % (ref 36.0–46.0)
Hemoglobin: 15.1 g/dL — ABNORMAL HIGH (ref 12.0–15.0)
Immature Granulocytes: 0 %
Lymphocytes Relative: 33 %
Lymphs Abs: 2.9 10*3/uL (ref 0.7–4.0)
MCH: 31.5 pg (ref 26.0–34.0)
MCHC: 35.1 g/dL (ref 30.0–36.0)
MCV: 89.8 fL (ref 80.0–100.0)
Monocytes Absolute: 0.8 10*3/uL (ref 0.1–1.0)
Monocytes Relative: 9 %
Neutro Abs: 4.8 10*3/uL (ref 1.7–7.7)
Neutrophils Relative %: 54 %
Platelets: 253 10*3/uL (ref 150–400)
RBC: 4.79 MIL/uL (ref 3.87–5.11)
RDW: 12.6 % (ref 11.5–15.5)
WBC: 8.8 10*3/uL (ref 4.0–10.5)
nRBC: 0 % (ref 0.0–0.2)

## 2022-01-22 LAB — IRON AND TIBC
Iron: 148 ug/dL (ref 28–170)
Saturation Ratios: 37 % — ABNORMAL HIGH (ref 10.4–31.8)
TIBC: 403 ug/dL (ref 250–450)
UIBC: 255 ug/dL

## 2022-01-22 LAB — FERRITIN: Ferritin: 98 ng/mL (ref 11–307)

## 2022-01-22 NOTE — Progress Notes (Signed)
Hematology/Oncology follow up  note [facility] Telephone:([phone] [phone]  Patient Care Team: Lavera Guise, MD as PCP - General (Internal Medicine) Edythe Clarity, [facility] as Pharmacist (Pharmacist) Earlie Server, MD as Consulting Physician (Hematology and Oncology)  REFERRING PROVIDER: Lavera Guise, MD   CHIEF COMPLAINTS/REASON FOR VISIT:  Follow-up for compound heterozygous hemochromatosis  PERTINENT HEMATOLOGY HISTORY  07/05/2020, patient had blood work done which showed decreased iron saturation 14, ferritin level was increased at 356, TIBC 315.  Patient was referred to hematology for further evaluation for possible hemochromatosis/elevated ferritin. Patient reports feeling well at baseline today. Patient drinks red wine 4-5 days per week.  Occasionally she also drinks beer when she orders pizza. Denies any recent infection, new medication. Denies any family history of hemochromatosis.   INTERVAL HISTORY Cynthia Keith is a 76 y.o. female who has above history reviewed by me today presents for follow up visit for compound heterozygous hemochromatosis mutation Problems and complaints are listed below: Patient reports chronic ongoing intermittent right upper quadrant pain after eating, duration since last year. She has had ultrasound abdomen in November 2022.  No cholecystitis at that point. Otherwise she has no new concerns or complaints.   Review of Systems  Constitutional:  Negative for appetite change, chills, fatigue and fever.  HENT:   Negative for hearing loss and voice change.   Eyes:  Negative for eye problems.  Respiratory:  Negative for chest tightness and cough.   Cardiovascular:  Negative for chest pain.  Gastrointestinal:  Negative for abdominal distention and blood in stool.       Right upper quadrant pain  Endocrine: Negative for hot flashes.  Genitourinary:  Negative for difficulty urinating and frequency.    Musculoskeletal:  Negative for arthralgias.  Skin:  Negative for itching and rash.  Neurological:  Negative for extremity weakness.  Hematological:  Negative for adenopathy.  Psychiatric/Behavioral:  Negative for confusion.    MEDICAL HISTORY:  Past Medical History:  Diagnosis Date   Actinic keratosis 03/15/2021   L post thigh    Basal cell carcinoma 06/21/2021   L alar crease, pt sched for EDC   GERD (gastroesophageal reflux disease)    Hyperlipidemia    Hypertension    Plantar fasciitis    Sleep apnea    Urinary tract infection     SURGICAL HISTORY: Past Surgical History:  Procedure Laterality Date   ABDOMINAL HYSTERECTOMY     COLONOSCOPY WITH PROPOFOL N/A 12/12/2015   Procedure: COLONOSCOPY WITH PROPOFOL;  Surgeon: Manya Silvas, MD;  Location: [facility];  Service: Endoscopy;  Laterality: N/A;   HAND SURGERY  2005    SOCIAL HISTORY: Social History   Socioeconomic History   Marital status: Married    Spouse name: Not on file   Number of children: Not on file   Years of education: Not on file   Highest education level: Not on file  Occupational History   Not on file  Tobacco Use   Smoking status: Never   Smokeless tobacco: Never  Vaping Use   Vaping Use: Never used  Substance and Sexual Activity   Alcohol use: Yes    Comment: ocassionally   Drug use: No   Sexual activity: Yes    Birth control/protection: Post-menopausal, Surgical  Other Topics Concern   Not on file  Social History Narrative   Not on file   Social Determinants of Health   Financial Resource Strain: Not on file  Food Insecurity:  Not on file  Transportation Needs: Not on file  Physical Activity: Not on file  Stress: Not on file  Social Connections: Not on file  Intimate Partner Violence: Not on file    FAMILY HISTORY: Family History  Problem Relation Age of Onset   Breast cancer Cousin    Stroke Mother    Heart disease Paternal Grandmother    Cancer Paternal  Grandfather    Stroke Sister    Stroke Brother     ALLERGIES:  is allergic to iodinated contrast media and iodine.  MEDICATIONS:  Current Outpatient Medications  Medication Sig Dispense Refill   amLODipine (NORVASC) 10 MG tablet TAKE 1 TABLET BY MOUTH EVERY DAY 90 tablet 1   aspirin EC 81 MG tablet Take 81 mg by mouth daily.     estradiol (ESTRACE) 0.1 MG/GM vaginal cream Apply one pea-sized amount around the opening of the urethra daily for 2 weeks, then 3 times weekly thereafter. 42.5 g 12   Multiple Vitamin (MULTIVITAMIN) tablet Take 1 tablet by mouth daily.     omeprazole (PRILOSEC) 20 MG capsule Take 1 capsule (20 mg total) by mouth daily. 90 capsule 1   clobetasol (TEMOVATE) 0.05 % external solution Patient to mix in jar of CeraVe cream. Apply 1-2 times a day as needed for itchy rash. Avoid face, groin, underarms. (Patient not taking: Reported on 12/05/2021) 50 mL 1   hydrochlorothiazide (HYDRODIURIL) 12.5 MG tablet Take 1 tablet (12.5 mg total) by mouth daily. (Patient not taking: Reported on 12/05/2021) 90 tablet 3   mirabegron ER (MYRBETRIQ) 25 MG TB24 tablet Take 1 tablet (25 mg total) by mouth daily. (Patient not taking: Reported on 12/05/2021) 28 tablet 0   predniSONE (DELTASONE) 10 MG tablet Take 1 tab po 3 x day for 3 days then take 1 tab po 2 x a day for 3 days and then take 1 tab po daily for 3 days (Patient not taking: Reported on 12/05/2021) 18 tablet 0   rosuvastatin (CRESTOR) 5 MG tablet Take 1 tablet (5 mg total) by mouth daily. (Patient not taking: Reported on 12/05/2021) 90 tablet 3   SHINGRIX injection  (Patient not taking: Reported on 12/05/2021)     No current facility-administered medications for this visit.     PHYSICAL EXAMINATION: ECOG PERFORMANCE STATUS: 0 - Asymptomatic Vitals:   01/22/22 1041  BP: (!) 143/74  Pulse: 74  Resp: 20  Temp: 98.7 F (37.1 C)  SpO2: 100%   Filed Weights   01/22/22 1041  Weight: 138 lb (62.6 kg)    Physical  Exam Constitutional:      General: She is not in acute distress. HENT:     Head: Normocephalic and atraumatic.  Eyes:     General: No scleral icterus. Cardiovascular:     Rate and Rhythm: Normal rate and regular rhythm.     Heart sounds: Normal heart sounds.  Pulmonary:     Effort: Pulmonary effort is normal. No respiratory distress.     Breath sounds: No wheezing.  Abdominal:     General: Bowel sounds are normal. There is no distension.     Palpations: Abdomen is soft.  Musculoskeletal:        General: No deformity. Normal range of motion.     Cervical back: Normal range of motion and neck supple.  Skin:    General: Skin is warm and dry.     Findings: No erythema or rash.  Neurological:     Mental Status: She is  alert and oriented to person, place, and time. Mental status is at baseline.     Cranial Nerves: No cranial nerve deficit.     Coordination: Coordination normal.  Psychiatric:        Mood and Affect: Mood normal.    LABORATORY DATA:  I have reviewed the data as listed Lab Results  Component Value Date   WBC 8.8 01/22/2022   HGB 15.1 (H) 01/22/2022   HCT 43.0 01/22/2022   MCV 89.8 01/22/2022   PLT 253 01/22/2022   Recent Labs    09/11/21 0959 01/22/22 1013  NA 140 134*  K 3.8 3.3*  CL 101 98  CO2 25 26  GLUCOSE 97 105*  BUN 13 18  CREATININE 0.67 0.62  CALCIUM 10.6* 10.1  GFRNONAA  --  >60  PROT 7.5 8.1  ALBUMIN 4.5 4.4  AST 36 34  ALT 26 28  ALKPHOS 74 63  BILITOT 0.4 0.6    Iron/TIBC/Ferritin/ %Sat    Component Value Date/Time   IRON 114 06/30/2021 0940   IRON 44 07/05/2020 1137   TIBC 381 06/30/2021 0940   TIBC 315 07/05/2020 1137   FERRITIN 92 06/30/2021 0940   FERRITIN 356 (H) 07/05/2020 1137   IRONPCTSAT 30 06/30/2021 0940   IRONPCTSAT 14 (L) 07/05/2020 1137      RADIOGRAPHIC STUDIES: I have personally reviewed the radiological images as listed and agreed with the findings in the report. No results found.    ASSESSMENT &  PLAN:  No diagnosis found.  #Compound heterozygous hemochromatosis mutation- carries 2 mutations CY282Y and H63D. Goal ferritin < 500. Plan for phlebotomy if ferritin >/= 500. Clinically, mildly symptomatic. Hold on phlebotomy until ferritin results today. Reinforced need to avoid alcohol and iron supplementation.   Fatty liver disease based on previous ultrasound finding. Follow up with PCP  Recurrent UTI- recommend she discuss with Urology for cystoscopy  No orders of the defined types were placed in this encounter.   All questions were answered. The patient knows to call the clinic with any problems questions or concerns.  Return of visit:  6 months-lab[CBC, iron TIBC ferritin CMP, Dr Tasia Catchings, +/- phlebotomy. Labs day to week ahead of appointment.   Beckey Rutter, DNP, AGNP-C [place] [facility] [phone] (clinic) 01/22/2022  cc Lavera Guise, MD

## 2022-01-22 NOTE — Progress Notes (Signed)
Patient states she has been having an UTI for the last year on and off again. Has took phenazopyridi recently from the walk in clinic. Patient states it still hurts to pee.

## 2022-01-24 ENCOUNTER — Other Ambulatory Visit: Payer: Self-pay

## 2022-01-24 ENCOUNTER — Encounter: Payer: Self-pay | Admitting: Urology

## 2022-01-24 ENCOUNTER — Ambulatory Visit
Admission: RE | Admit: 2022-01-24 | Discharge: 2022-01-24 | Disposition: A | Discharge status: Home / Self Care | Payer: Medicare HMO | Source: Ambulatory Visit | Attending: Urology | Admitting: Urology

## 2022-01-24 ENCOUNTER — Ambulatory Visit: Payer: Medicare HMO | Admitting: Urology

## 2022-01-24 VITALS — BP 132/80 | HR 86 | Ht <= 58 in | Wt 136.0 lb

## 2022-01-24 DIAGNOSIS — N2889 Other specified disorders of kidney and ureter: Secondary | ICD-10-CM

## 2022-01-24 DIAGNOSIS — N281 Cyst of kidney, acquired: Secondary | ICD-10-CM | POA: Diagnosis not present

## 2022-01-24 DIAGNOSIS — N390 Urinary tract infection, site not specified: Secondary | ICD-10-CM

## 2022-01-24 MED ORDER — CEFUROXIME AXETIL 250 MG PO TABS: 250.0000 mg | ORAL_TABLET | Freq: Two times a day (BID) | ORAL | 0 refills | Status: AC

## 2022-01-24 NOTE — Progress Notes (Signed)
01/24/2022 3:08 PM   Cynthia Keith August 08, 1946 [id number]  Referring provider: Lavera Guise, [place],  [place] [address]  Chief Complaint  Patient presents with   Recurrent UTI    Urologic History 1. Renal Mass - 2 x 1.5 x 1.4 cm enhancing right renal mass MRI 2009 - Surveillance elective  2.  Recurrent UTI   HPI: 76 y.o. female called for an acute visit for UTI symptoms.  1 week history suprapubic discomfort and bladder pain at the end of urination States she was recently seen at urgent care and prescribed phenazopyridine which did not improve her symptoms Visits with [facility] and December 2022 for UTI symptoms with the positive urine culture for E. Coli Symptoms resolved after antibiotics then returned No fever, chills, nausea or vomiting Denies gross hematuria   PMH: Past Medical History:  Diagnosis Date   Actinic keratosis 03/15/2021   L post thigh    Basal cell carcinoma 06/21/2021   L alar crease, pt sched for EDC   GERD (gastroesophageal reflux disease)    Hyperlipidemia    Hypertension    Plantar fasciitis    Sleep apnea    Urinary tract infection     Surgical History: Past Surgical History:  Procedure Laterality Date   ABDOMINAL HYSTERECTOMY     COLONOSCOPY WITH PROPOFOL N/A 12/12/2015   Procedure: COLONOSCOPY WITH PROPOFOL;  Surgeon: Manya Silvas, MD;  Location: [facility];  Service: Endoscopy;  Laterality: N/A;   HAND SURGERY  2005    Home Medications:  Allergies as of 01/24/2022       Reactions   Iodinated Contrast Media Anaphylaxis   Remote hx of respiratory arrest after IV iodine for kidney study. Immediately treated w/o sequela. No previous or subsequent IVP exposure. Remote hx of respiratory arrest after IV iodine for kidney study. Immediately treated w/o sequela. No previous or subsequent IVP exposure. Remote hx of respiratory arrest after IV iodine for kidney study. Immediately treated w/o sequela. No  previous or subsequent IVP exposure.   Iodine Shortness Of Breath   "Quit Breathing"        Medication List        Accurate as of January 24, 2022  3:08 PM. If you have any questions, ask your nurse or doctor.          amLODipine 10 MG tablet Commonly known as: NORVASC TAKE 1 TABLET BY MOUTH EVERY DAY   aspirin EC 81 MG tablet Take 81 mg by mouth daily.   clobetasol 0.05 % external solution Commonly known as: TEMOVATE Patient to mix in jar of CeraVe cream. Apply 1-2 times a day as needed for itchy rash. Avoid face, groin, underarms.   estradiol 0.1 MG/GM vaginal cream Commonly known as: ESTRACE Apply one pea-sized amount around the opening of the urethra daily for 2 weeks, then 3 times weekly thereafter.   hydrochlorothiazide 12.5 MG tablet Commonly known as: HYDRODIURIL Take 1 tablet (12.5 mg total) by mouth daily.   mirabegron ER 25 MG Tb24 tablet Commonly known as: MYRBETRIQ Take 1 tablet (25 mg total) by mouth daily.   multivitamin tablet Take 1 tablet by mouth daily.   omeprazole 20 MG capsule Commonly known as: PRILOSEC Take 1 capsule (20 mg total) by mouth daily.   predniSONE 10 MG tablet Commonly known as: DELTASONE Take 1 tab po 3 x day for 3 days then take 1 tab po 2 x a day for 3 days and then take  1 tab po daily for 3 days   rosuvastatin 5 MG tablet Commonly known as: Crestor Take 1 tablet (5 mg total) by mouth daily.   Shingrix injection Generic drug: Zoster Vaccine Adjuvanted        Allergies:  Allergies  Allergen Reactions   Iodinated Contrast Media Anaphylaxis    Remote hx of respiratory arrest after IV iodine for kidney study. Immediately treated w/o sequela. No previous or subsequent IVP exposure. Remote hx of respiratory arrest after IV iodine for kidney study. Immediately treated w/o sequela. No previous or subsequent IVP exposure. Remote hx of respiratory arrest after IV iodine for kidney study. Immediately treated w/o sequela.  No previous or subsequent IVP exposure.   Iodine Shortness Of Breath    "Quit Breathing"    Family History: Family History  Problem Relation Age of Onset   Breast cancer Cousin    Stroke Mother    Heart disease Paternal Grandmother    Cancer Paternal Grandfather    Stroke Sister    Stroke Brother     Social History:  reports that she has never smoked. She has never used smokeless tobacco. She reports current alcohol use. She reports that she does not use drugs.   Physical Exam: BP 132/80    Pulse 86    Ht 4\' 8"  (1.422 m)    Wt 136 lb (61.7 kg)    BMI 30.49 kg/m   Constitutional:  Alert and oriented, No acute distress. HEENT: [place] AT, moist mucus membranes.  Trachea midline, no masses. Cardiovascular: No clubbing, cyanosis, or edema. Respiratory: Normal respiratory effort, no increased work of breathing. Neurologic: Grossly intact, no focal deficits, moving all 4 extremities. Psychiatric: Normal mood and affect.  Laboratory Data:  Urinalysis Appearance-yellow cloudy Dipstick-2+ leukocytes Microscopy->30 WBC/many bacteria  Assessment & Plan:    1.  Recurrent UTI Urine culture ordered Rx cefuroxime 250 mg twice daily x7 days sent to pharmacy With recent recurrent symptoms we will give a 8-week course of low-dose antibiotic prophylaxis however will await culture results  2.  Right renal mass Was scheduled later this month for follow-up of her right renal mass.  While she was waiting in the room radiology called her to schedule the ultrasound.  They asked that she come by radiology after her visit today for the ultrasound.  Will cancel her appointment later this month and call with results   Abbie Sons, MD  [facility] [address] [place], [place] [address] [phone]

## 2022-01-25 LAB — URINALYSIS, COMPLETE
Bilirubin, UA: NEGATIVE
Glucose, UA: NEGATIVE
Ketones, UA: NEGATIVE
Nitrite, UA: NEGATIVE
Protein,UA: NEGATIVE
RBC, UA: NEGATIVE
Specific Gravity, UA: 1.02 (ref 1.005–1.030)
Urobilinogen, Ur: 0.2 mg/dL (ref 0.2–1.0)
pH, UA: 6 (ref 5.0–7.5)

## 2022-01-25 LAB — MICROSCOPIC EXAMINATION: WBC, UA: >30 /hpf — AB (ref 0–5)

## 2022-01-26 LAB — CULTURE, URINE COMPREHENSIVE

## 2022-01-28 ENCOUNTER — Other Ambulatory Visit: Payer: Self-pay | Admitting: Urology

## 2022-01-28 DIAGNOSIS — N2889 Other specified disorders of kidney and ureter: Secondary | ICD-10-CM

## 2022-01-29 ENCOUNTER — Other Ambulatory Visit: Payer: Self-pay | Admitting: *Deleted

## 2022-01-29 ENCOUNTER — Telehealth: Payer: Self-pay | Admitting: *Deleted

## 2022-01-29 DIAGNOSIS — R1013 Epigastric pain: Secondary | ICD-10-CM | POA: Diagnosis not present

## 2022-01-29 MED ORDER — TRIMETHOPRIM 100 MG PO TABS: 100.0000 mg | ORAL_TABLET | Freq: Every day | ORAL | 1 refills | Status: DC

## 2022-01-29 NOTE — Telephone Encounter (Signed)
-----   Message from Abbie Sons, MD sent at 01/28/2022  9:53 AM EST ----- Urine culture was positive and sensitive to prescribed antibiotic.  Send in Rx trimethoprim 100 mg daily to start once she completes her cefuroxime course (#30 with 1 refill.)

## 2022-01-29 NOTE — Telephone Encounter (Signed)
Patient returned the call.  She was advised of the urine culture results and to continue to take the prescribed antibiotic.    I also advised her that we will send in the Rx for trimethoprim to take daily once she completes the course of the antibiotic.   She confirmed that she would like it sent to the [facility] in [place].

## 2022-02-05 ENCOUNTER — Other Ambulatory Visit: Payer: Self-pay

## 2022-02-05 ENCOUNTER — Ambulatory Visit: Payer: Medicare HMO | Admitting: Dermatology

## 2022-02-05 DIAGNOSIS — L309 Dermatitis, unspecified: Secondary | ICD-10-CM

## 2022-02-05 DIAGNOSIS — D225 Melanocytic nevi of trunk: Secondary | ICD-10-CM

## 2022-02-05 DIAGNOSIS — D229 Melanocytic nevi, unspecified: Secondary | ICD-10-CM

## 2022-02-05 MED ORDER — CLOBETASOL PROPIONATE 0.05 % EX CREA
1.0000 | TOPICAL_CREAM | Freq: Two times a day (BID) | CUTANEOUS | 1 refills | Status: DC
Start: 2022-02-05 — End: 2022-10-20

## 2022-02-05 NOTE — Progress Notes (Signed)
° °  Follow-Up Visit   Subjective  Cynthia Keith is a 76 y.o. female who presents for the following: Skin Problem (Patient here today for a spot at right buttock that has been itching, present for years. Patient also with an itchy spot a right calf, top of left foot and right 2nd toe.  Patient has been using clobetasol/CeraVe mix and plain clobetasol that she has been prescribed in the past for dermatitis. ).  Patient does have a hx of BCC and AK.  The following portions of the chart were reviewed this encounter and updated as appropriate:       Review of Systems:  No other skin or systemic complaints except as noted in HPI or Assessment and Plan.  Objective  Well appearing patient in no apparent distress; mood and affect are within normal limits.  A focused examination was performed including feet, right lower leg, buttocks. Relevant physical exam findings are noted in the Assessment and Plan.  Right Lower Leg, feet Pink scaly patch at right posterior ankle, right 2nd toe, left foot dorsum, left 2nd toe  right buttock Pink flesh papule    Assessment & Plan  Dermatitis Right Lower Leg, feet  Start clobetasol 0.05% cream twice daily to spot treat affected areas as needed for itch and rash. Avoid applying to face, groin, and axilla. Use as directed. Long-term use can cause thinning of the skin.  Topical steroids (such as triamcinolone, fluocinolone, fluocinonide, mometasone, clobetasol, halobetasol, betamethasone, hydrocortisone) can cause thinning and lightening of the skin if they are used for too long in the same area. Your physician has selected the right strength medicine for your problem and area affected on the body. Please use your medication only as directed by your physician to prevent side effects.    clobetasol cream (TEMOVATE) 0.05 % - Right Lower Leg, feet Apply 1 application topically 2 (two) times daily. Apply twice daily to spot treat affected areas as needed for  itch. Avoid applying to face, groin, and axilla. Use as directed. Long-term use can cause thinning of the skin.  Nevus right buttock  Irritated If itching not improved, patient to RTC for shave removal.  Start clobetasol 0.05% cream twice daily to spot treat affected areas as needed for itch. Avoid applying to face, groin, and axilla. Use as directed. Long-term use can cause thinning of the skin.  Topical steroids (such as triamcinolone, fluocinolone, fluocinonide, mometasone, clobetasol, halobetasol, betamethasone, hydrocortisone) can cause thinning and lightening of the skin if they are used for too long in the same area. Your physician has selected the right strength medicine for your problem and area affected on the body. Please use your medication only as directed by your physician to prevent side effects.      Return for TBSE, Eczema 4-6 weeks.  Graciella Belton, RMA, am acting as scribe for Brendolyn Patty, MD .  Documentation: I have reviewed the above documentation for accuracy and completeness, and I agree with the above.  Brendolyn Patty MD

## 2022-02-05 NOTE — Patient Instructions (Addendum)
Start clobetasol 0.05% cream twice daily to spot treat affected areas as needed for itch and rash. Avoid applying to face, groin, and axilla. Use as directed. Long-term use can cause thinning of the skin.  Topical steroids (such as triamcinolone, fluocinolone, fluocinonide, mometasone, clobetasol, halobetasol, betamethasone, hydrocortisone) can cause thinning and lightening of the skin if they are used for too long in the same area. Your physician has selected the right strength medicine for your problem and area affected on the body. Please use your medication only as directed by your physician to prevent side effects.   If You Need Anything After Your Visit  If you have any questions or concerns for your doctor, please call our main line at [phone] and press option 4 to reach your doctor's medical assistant. If no one answers, please leave a voicemail as directed and we will return your call as soon as possible. Messages left after 4 pm will be answered the following business day.   You may also send [place] a message via [place]. We typically respond to MyChart messages within 1-2 business days.  For prescription refills, please ask your pharmacy to contact our office. Our fax number is [phone].  If you have an urgent issue when the clinic is closed that cannot wait until the next business day, you can page your doctor at the number below.    Please note that while we do our best to be available for urgent issues outside of office hours, we are not available 24/7.   If you have an urgent issue and are unable to reach [place], you may choose to seek medical care at your doctor's office, retail clinic, urgent care center, or emergency room.  If you have a medical emergency, please immediately call 911 or go to the emergency department.  Pager Numbers  - Dr. Nehemiah Massed: [phone]  - Dr. Laurence Ferrari: [phone]  - Dr. Nicole Kindred: [phone]  In the event of inclement weather, please call our  main line at [phone] for an update on the status of any delays or closures.  Dermatology Medication Tips: Please keep the boxes that topical medications come in in order to help keep track of the instructions about where and how to use these. Pharmacies typically print the medication instructions only on the boxes and not directly on the medication tubes.   If your medication is too expensive, please contact our office at [phone] option 4 or send [place] a message through [place].   We are unable to tell what your co-pay for medications will be in advance as this is different depending on your insurance coverage. However, we may be able to find a substitute medication at lower cost or fill out paperwork to get insurance to cover a needed medication.   If a prior authorization is required to get your medication covered by your insurance company, please allow [place] 1-2 business days to complete this process.  Drug prices often vary depending on where the prescription is filled and some pharmacies may offer cheaper prices.  The website www.goodrx.com contains coupons for medications through different pharmacies. The prices here do not account for what the cost may be with help from insurance (it may be cheaper with your insurance), but the website can give you the price if you did not use any insurance.  - You can print the associated coupon and take it with your prescription to the pharmacy.  - You may also stop by our office during regular business hours and pick  up a GoodRx coupon card.  - If you need your prescription sent electronically to a different pharmacy, notify our office through [facility] or by phone at [phone] option 4.     Si Usted Necesita Algo Despus de Su Visita  Tambin puede enviarnos un mensaje a travs de Pharmacist, community. Por lo general respondemos a los mensajes de MyChart en el transcurso de 1 a 2 das hbiles.  Para renovar recetas, por favor pida a su  farmacia que se ponga en contacto con nuestra oficina. Harland Dingwall de fax es [employer] [phone].  Si tiene un asunto urgente cuando la clnica est cerrada y que no puede esperar hasta el siguiente da hbil, puede llamar/localizar a su doctor(a) al nmero que aparece a continuacin.   Por favor, tenga en cuenta que aunque hacemos todo lo posible para estar disponibles para asuntos urgentes fuera del horario de [place], no estamos disponibles las 24 horas del da, los 7 das de la [place].   Si tiene un problema urgente y no puede comunicarse con nosotros, puede optar por buscar atencin mdica  en el consultorio de su doctor(a), en una clnica privada, en un centro de atencin urgente o en una sala de emergencias.  Si tiene Engineering geologist, por favor llame inmediatamente al 911 o vaya a la sala de emergencias.  Nmeros de bper  - Dr. Nehemiah Massed: [phone]  - Dra. Moye: [phone]  - Dra. Nicole Kindred: [phone]  En caso de inclemencias del [place], por favor llame a Johnsie Kindred principal al [phone] para una actualizacin sobre el [place] de cualquier retraso o cierre.  Consejos para la medicacin en dermatologa: Por favor, guarde las cajas en las que vienen los medicamentos de uso tpico para ayudarle a seguir las instrucciones sobre dnde y cmo usarlos. Las farmacias generalmente imprimen las instrucciones del medicamento slo en las cajas y no directamente en los tubos del [place].   Si su medicamento es muy caro, por favor, pngase en contacto con Zigmund Daniel llamando al [phone] y presione la opcin 4 o envenos un mensaje a travs de Pharmacist, community.   No podemos decirle cul ser su copago por los medicamentos por adelantado ya que esto es diferente dependiendo de la cobertura de su seguro. Sin embargo, es posible que podamos encontrar un medicamento sustituto a Electrical engineer un formulario para que el seguro cubra el medicamento que se considera necesario.    Si se requiere una autorizacin previa para que su compaa de seguros [place] su medicamento, por favor permtanos de 1 a 2 das hbiles para completar este proceso.  Los precios de los medicamentos varan con frecuencia dependiendo del Environmental consultant de dnde se surte la receta y alguna farmacias pueden ofrecer precios ms baratos.  El sitio web www.goodrx.com tiene cupones para medicamentos de Airline pilot. Los precios aqu no tienen en cuenta lo que podra costar con la ayuda del seguro (puede ser ms barato con su seguro), pero el sitio web puede darle el precio si no utiliz Research scientist (physical sciences).  - Puede imprimir el cupn correspondiente y llevarlo con su receta a la farmacia.  - Tambin puede pasar por nuestra oficina durante el horario de atencin regular y Charity fundraiser una tarjeta de cupones de GoodRx.  - Si necesita que su receta se enve electrnicamente a una farmacia diferente, informe a nuestra oficina a travs de MyChart de [place] o por telfono llamando al [phone] y presione la opcin 4.

## 2022-02-07 ENCOUNTER — Other Ambulatory Visit: Payer: Self-pay | Admitting: Internal Medicine

## 2022-02-07 DIAGNOSIS — I10 Essential (primary) hypertension: Secondary | ICD-10-CM

## 2022-02-12 ENCOUNTER — Telehealth: Payer: Self-pay | Admitting: Urology

## 2022-02-12 ENCOUNTER — Telehealth: Payer: Self-pay

## 2022-02-12 NOTE — Telephone Encounter (Signed)
Pt called that she still having uti symptoms advised to call her urology due she is already saw urology she is having medicine given by urology so please follow up with urology for further evaluation

## 2022-02-12 NOTE — Telephone Encounter (Signed)
Patient called the office this morning requesting an appointment for persistent UTI.  She has completed a round of antibiotics and started the trimethoprim, but is experiencing burning during urination.  I added her to Dr. Dene Keith schedule for Wednesday, 2/22.

## 2022-02-13 ENCOUNTER — Other Ambulatory Visit: Payer: Self-pay

## 2022-02-13 ENCOUNTER — Emergency Department
Admission: EM | Admit: 2022-02-13 | Discharge: 2022-02-13 | Disposition: A | Discharge status: Home / Self Care | Payer: Medicare HMO | Attending: Emergency Medicine | Admitting: Emergency Medicine

## 2022-02-13 ENCOUNTER — Encounter: Payer: Self-pay | Admitting: Emergency Medicine

## 2022-02-13 DIAGNOSIS — N814 Uterovaginal prolapse, unspecified: Secondary | ICD-10-CM | POA: Diagnosis not present

## 2022-02-13 DIAGNOSIS — R300 Dysuria: Secondary | ICD-10-CM | POA: Diagnosis not present

## 2022-02-13 DIAGNOSIS — N819 Female genital prolapse, unspecified: Secondary | ICD-10-CM | POA: Diagnosis not present

## 2022-02-13 DIAGNOSIS — N8110 Cystocele, unspecified: Secondary | ICD-10-CM

## 2022-02-13 DIAGNOSIS — Z23 Encounter for immunization: Secondary | ICD-10-CM | POA: Diagnosis not present

## 2022-02-13 LAB — COMPREHENSIVE METABOLIC PANEL
ALT: 35 U/L (ref 0–44)
AST: 34 U/L (ref 15–41)
Albumin: 4 g/dL (ref 3.5–5.0)
Alkaline Phosphatase: 67 U/L (ref 38–126)
Anion gap: 9 (ref 5–15)
BUN: 18 mg/dL (ref 8–23)
CO2: 28 mmol/L (ref 22–32)
Calcium: 9.8 mg/dL (ref 8.9–10.3)
Chloride: 101 mmol/L (ref 98–111)
Creatinine, Ser: 0.82 mg/dL (ref 0.44–1.00)
GFR, Estimated: >60 mL/min (ref >60)
Glucose, Bld: 118 mg/dL — ABNORMAL HIGH (ref 70–99)
Potassium: 3.9 mmol/L (ref 3.5–5.1)
Sodium: 138 mmol/L (ref 135–145)
Total Bilirubin: 0.6 mg/dL (ref 0.3–1.2)
Total Protein: 7.6 g/dL (ref 6.5–8.1)

## 2022-02-13 LAB — CBC WITH DIFFERENTIAL/PLATELET
Abs Immature Granulocytes: 0.04 10*3/uL (ref 0.00–0.07)
Basophils Absolute: 0.1 10*3/uL (ref 0.0–0.1)
Basophils Relative: 1 %
Eosinophils Absolute: 0.3 10*3/uL (ref 0.0–0.5)
Eosinophils Relative: 4 %
HCT: 41.7 % (ref 36.0–46.0)
Hemoglobin: 14.2 g/dL (ref 12.0–15.0)
Immature Granulocytes: 1 %
Lymphocytes Relative: 39 %
Lymphs Abs: 3 10*3/uL (ref 0.7–4.0)
MCH: 31.3 pg (ref 26.0–34.0)
MCHC: 34.1 g/dL (ref 30.0–36.0)
MCV: 91.9 fL (ref 80.0–100.0)
Monocytes Absolute: 0.8 10*3/uL (ref 0.1–1.0)
Monocytes Relative: 10 %
Neutro Abs: 3.6 10*3/uL (ref 1.7–7.7)
Neutrophils Relative %: 45 %
Platelets: 254 10*3/uL (ref 150–400)
RBC: 4.54 MIL/uL (ref 3.87–5.11)
RDW: 12.6 % (ref 11.5–15.5)
WBC: 7.8 10*3/uL (ref 4.0–10.5)
nRBC: 0 % (ref 0.0–0.2)

## 2022-02-13 LAB — URINALYSIS, ROUTINE W REFLEX MICROSCOPIC
Bilirubin Urine: NEGATIVE
Glucose, UA: NEGATIVE mg/dL
Hgb urine dipstick: NEGATIVE
Ketones, ur: NEGATIVE mg/dL
Nitrite: NEGATIVE
Protein, ur: NEGATIVE mg/dL
Specific Gravity, Urine: 1.009 (ref 1.005–1.030)
WBC, UA: >50 WBC/hpf — ABNORMAL HIGH (ref 0–5)
pH: 8 (ref 5.0–8.0)

## 2022-02-13 LAB — WET PREP, GENITAL
Clue Cells Wet Prep HPF POC: NONE SEEN
Sperm: NONE SEEN
Trich, Wet Prep: NONE SEEN
WBC, Wet Prep HPF POC: <10 (ref <10)
Yeast Wet Prep HPF POC: NONE SEEN

## 2022-02-13 LAB — CHLAMYDIA/NGC RT PCR (ARMC ONLY)
Chlamydia Tr: NOT DETECTED
N gonorrhoeae: NOT DETECTED

## 2022-02-13 NOTE — Discharge Instructions (Addendum)
As we discussed, I am concerned about vaginal prolapse contributing to your pain with urination.  Continue your trimethoprim and follow-up with Dr. Bernardo Heater tomorrow.  Please reach out to the gynecologist, I have attached the phone number for the clinic of Dr. Amalia Hailey at [facility].  If you develop any fevers, severely worsening pain then please return to the ED.

## 2022-02-13 NOTE — ED Triage Notes (Addendum)
Patient ambulatory to triage with steady gait, without difficulty or distress noted; pt reports taking numerous antibiotics for several mos for UTI; c/o persistent dysuria frequency and nausea

## 2022-02-13 NOTE — ED Notes (Signed)
See triage note  presents with urinary freg and pain  states she has been treated for freq UTI's   cont's to have dysuria and also some body aches  afebrile on arrival

## 2022-02-13 NOTE — ED Provider Notes (Signed)
[facility] Provider Note    Event Date/Time   First MD Initiated Contact with Patient 02/13/22 [phone]     (approximate)   History   Dysuria   HPI  Cynthia Keith is a 76 y.o. female who presents to the ED for evaluation of Dysuria   Last urology visit on 2/1.  Recurrent UTIs and multiple urine cultures with E. coli: December and February most recently.  She was placed on cefuroxime twice daily for 1 week on 2/1, then a follow-up prophylactic trimethoprim for 8 weeks.  Ultrasound on 2/1 with exophytic right renal mass 1.7 cm.  Pt presents to the ED, accompanied by her husband, for evaluation of continued dysuria. She reports her symptoms may have improved for a few days after the cefuroxime, but have persisted after this and worsened despite her adherence to antibiotics. She reports occasionally feeling a fullness sensation to her pelvis, but denies external or known prolapse.   Reports she is currently using 3 times weekly estrogen cream, prescribed by the PA at urology, but has not seen gynecology.  Denies fever, emesis or flank pain.  Denies vaginal discharge or bleeding.  Denies hematuria.  Physical Exam   Triage Vital Signs: ED Triage Vitals  Enc Vitals Group     BP 02/13/22 0627 (!) 155/78     Pulse Rate 02/13/22 0627 71     Resp 02/13/22 0627 18     Temp 02/13/22 0627 97.8 F (36.6 C)     Temp Source 02/13/22 0627 Oral     SpO2 02/13/22 0627 98 %     Weight 02/13/22 0628 132 lb (59.9 kg)     Height 02/13/22 0628 4\' 8"  (1.422 m)     Head Circumference --      Peak Flow --      Pain Score 02/13/22 0628 0     Pain Loc --      Pain Edu? --      Excl. in [place]? --     Most recent vital signs: Vitals:   02/13/22 0627  BP: (!) 155/78  Pulse: 71  Resp: 18  Temp: 97.8 F (36.6 C)  SpO2: 98%    General: Awake, no distress.  Well-appearing and conversational. CV:  Good peripheral perfusion.  Resp:  Normal effort.  Abd:  No distention.   Minimal suprapubic tenderness, but no peritoneal features.  Abdomen otherwise benign. MSK:  No deformity noted.  Neuro:  No focal deficits appreciated. Other:  Pelvic exam.  External genitalia without clear evidence of atrophic vaginitis.  No introital stenosis.  Pubic hair is present and not scarce.  No fusion of labia minora or clear evidence of loss of labial fat pads. With gentle separation of labia, I do visualize a pink soft tissue mass coming from the vaginal introitus, but does not quite extend externally, concerning for prolapse.  Speculum examination demonstrates no intravaginal lesions, bleeding.  Minimal thin discharge sent for swabs.   ED Results / Procedures / Treatments   Labs (all labs ordered are listed, but only abnormal results are displayed) Labs Reviewed  URINALYSIS, ROUTINE W REFLEX MICROSCOPIC - Abnormal; Notable for the following components:      Result Value   Color, Urine YELLOW (*)    APPearance CLOUDY (*)    Leukocytes,Ua LARGE (*)    WBC, UA >50 (*)    Bacteria, UA MANY (*)    All other components within normal limits  COMPREHENSIVE METABOLIC PANEL -  Abnormal; Notable for the following components:   Glucose, Bld 118 (*)    All other components within normal limits  WET PREP, GENITAL  URINE CULTURE  CHLAMYDIA/NGC RT PCR ([facility] ONLY)            CBC WITH DIFFERENTIAL/PLATELET    EKG   RADIOLOGY   Official radiology report(s): No results found.  PROCEDURES and INTERVENTIONS:  Pelvic exam  Date/Time: 02/13/2022 8:07 AM Performed by: Vladimir Crofts, MD Authorized by: Vladimir Crofts, MD  Consent: Verbal consent obtained. Risks and benefits: risks, benefits and alternatives were discussed Consent given by: patient Patient tolerance: patient tolerated the procedure well with no immediate complications    Medications - No data to display   IMPRESSION / MDM / [place] / ED COURSE  I reviewed the triage vital signs and the nursing  notes.  Pleasant 76 year old woman presents to the ED with continued subacute and chronic dysuria, likely more gynecologic in etiology such as due to vaginal prolapse, and suitable for outpatient management.  She is generally well-appearing without distress or systemic illness.  Reassuring abdominal examination without peritoneal features.  Minimal suprapubic tenderness is noted.  Her blood work is benign with no leukocytosis to suggest untreated infection, sepsis or pyelonephritis.  Renal function intact and normal metabolic panel.  Urine with pyuria and will be sent for culture.  Urology has appropriately been treating her with antibiotics for concerns for recurrent UTI, but despite this she has had continued dysuria.  Suspect this is more gynecologic in etiology.  Pelvic exam with signs of prolapse more so than atrophic vaginitis.  She has an upcoming urology appointment already scheduled for tomorrow, so shared decision making it was plan not pursue additional courses of antibiotics but to follow-up with them tomorrow and the urine culture from today, as well as referral to gynecology.  Return precautions discussed.  Clinical Course as of 02/13/22 6222  Tue Feb 13, 2022  0713 I reviewed recent urine cultures, most recently from 2/1.  E. coli mostly pansensitive, resistant to ampicillin and Augmentin. [DS]  9798 Discussed other possible etiologies of her symptoms beyond UTI, cystitis and urologic.  We discussed gynecologic etiologies of possible dysuria.  I recommended pelvic examination and she is agreeable. [DS]  0802 Pelvic performed and well-tolerated.  Prolapse of soft tissue is noted at the vaginal introitus, but does not extend externally.  Uncertain if this is vaginal or cystic.  Swab sent for wet prep and GC.  Discussed my concern to the patient and discussed referral to gynecology. [DS]  9211 Wet prep negative. Again discussed my recs to the patient. She ambulates out in NAD [DS]    Clinical  Course User Index [DS] Vladimir Crofts, MD     FINAL CLINICAL IMPRESSION(S) / ED DIAGNOSES   Final diagnoses:  Vaginal prolapse without uterine prolapse  Dysuria     Rx / DC Orders   ED Discharge Orders     None        Note:  This document was prepared using Dragon voice recognition software and may include unintentional dictation errors.   Vladimir Crofts, MD 02/13/22 [phone]

## 2022-02-14 ENCOUNTER — Ambulatory Visit: Payer: Medicare HMO | Admitting: Urology

## 2022-02-14 ENCOUNTER — Encounter: Payer: Self-pay | Admitting: Urology

## 2022-02-14 ENCOUNTER — Ambulatory Visit: Payer: Self-pay | Admitting: Urology

## 2022-02-14 VITALS — BP 137/69 | HR 80 | Ht <= 58 in | Wt 137.0 lb

## 2022-02-14 DIAGNOSIS — N390 Urinary tract infection, site not specified: Secondary | ICD-10-CM

## 2022-02-14 LAB — URINALYSIS, COMPLETE
Bilirubin, UA: NEGATIVE
Glucose, UA: NEGATIVE
Ketones, UA: NEGATIVE
Nitrite, UA: NEGATIVE
Protein,UA: NEGATIVE
Specific Gravity, UA: 1.015 (ref 1.005–1.030)
Urobilinogen, Ur: 0.2 mg/dL (ref 0.2–1.0)
pH, UA: 6.5 (ref 5.0–7.5)

## 2022-02-14 LAB — MICROSCOPIC EXAMINATION
Epithelial Cells (non renal): >10 /hpf — ABNORMAL HIGH (ref 0–10)
WBC, UA: >30 /hpf — ABNORMAL HIGH (ref 0–5)

## 2022-02-14 LAB — BLADDER SCAN AMB NON-IMAGING: Scan Result: 28

## 2022-02-14 NOTE — Progress Notes (Signed)
02/14/2022 9:45 AM   EYANA STOLZE Jun 19, 1946 [id number]  Referring provider: Lavera Guise, MD [address] [place],  [place] [address]  Chief Complaint  Patient presents with   Recurrent UTI    HPI: 76 y.o. female called for an appointment for persistent UTI symptoms.  Seen 01/24/2022 for persistent UTI symptoms.  Urine culture at that visit grew E. coli which was resistant to ampicillin and intermediate to amoxicillin otherwise pansensitive She was treated with cefuroxime then started on low-dose trimethoprim for an 8-week course of prophylaxis She called earlier this week for an appointment for recurrent symptoms and was seen in the ED yesterday morning complaining of dysuria Urinalysis with >50 WBC and urine culture was ordered Denies fever, chills or gross hematuria Wet prep and chlamydia/GC testing was also negative Exam in the ED felt consistent with prolapse and GYN follow-up was recommended.  Prior GYN surgery <10 years ago with mesh   PMH: Past Medical History:  Diagnosis Date   Actinic keratosis 03/15/2021   L post thigh    Basal cell carcinoma 06/21/2021   L alar crease, pt sched for EDC   GERD (gastroesophageal reflux disease)    Hyperlipidemia    Hypertension    Plantar fasciitis    Sleep apnea    Urinary tract infection     Surgical History: Past Surgical History:  Procedure Laterality Date   ABDOMINAL HYSTERECTOMY     COLONOSCOPY WITH PROPOFOL N/A 12/12/2015   Procedure: COLONOSCOPY WITH PROPOFOL;  Surgeon: Manya Silvas, MD;  Location: [facility];  Service: Endoscopy;  Laterality: N/A;   HAND SURGERY  2005    Home Medications:  Allergies as of 02/14/2022       Reactions   Iodinated Contrast Media Anaphylaxis   Remote hx of respiratory arrest after IV iodine for kidney study. Immediately treated w/o sequela. No previous or subsequent IVP exposure. Remote hx of respiratory arrest after IV iodine for kidney study. Immediately treated w/o  sequela. No previous or subsequent IVP exposure. Remote hx of respiratory arrest after IV iodine for kidney study. Immediately treated w/o sequela. No previous or subsequent IVP exposure.   Iodine Shortness Of Breath   "Quit Breathing"        Medication List        Accurate as of February 14, 2022  9:45 AM. If you have any questions, ask your nurse or doctor.          amLODipine 10 MG tablet Commonly known as: NORVASC TAKE 1 TABLET BY MOUTH EVERY DAY   aspirin EC 81 MG tablet Take 81 mg by mouth daily.   clobetasol 0.05 % external solution Commonly known as: TEMOVATE Patient to mix in jar of CeraVe cream. Apply 1-2 times a day as needed for itchy rash. Avoid face, groin, underarms.   clobetasol cream 0.05 % Commonly known as: TEMOVATE Apply 1 application topically 2 (two) times daily. Apply twice daily to spot treat affected areas as needed for itch. Avoid applying to face, groin, and axilla. Use as directed. Long-term use can cause thinning of the skin.   estradiol 0.1 MG/GM vaginal cream Commonly known as: ESTRACE Apply one pea-sized amount around the opening of the urethra daily for 2 weeks, then 3 times weekly thereafter.   hydrochlorothiazide 12.5 MG tablet Commonly known as: HYDRODIURIL Take 1 tablet (12.5 mg total) by mouth daily.   mirabegron ER 25 MG Tb24 tablet Commonly known as: MYRBETRIQ Take 1 tablet (25 mg total) by  mouth daily.   multivitamin tablet Take 1 tablet by mouth daily.   omeprazole 20 MG capsule Commonly known as: PRILOSEC Take 1 capsule (20 mg total) by mouth daily.   predniSONE 10 MG tablet Commonly known as: DELTASONE Take 1 tab po 3 x day for 3 days then take 1 tab po 2 x a day for 3 days and then take 1 tab po daily for 3 days   rosuvastatin 5 MG tablet Commonly known as: Crestor Take 1 tablet (5 mg total) by mouth daily.   Shingrix injection Generic drug: Zoster Vaccine Adjuvanted   trimethoprim 100 MG tablet Commonly  known as: TRIMPEX Take 1 tablet (100 mg total) by mouth daily.        Allergies:  Allergies  Allergen Reactions   Iodinated Contrast Media Anaphylaxis    Remote hx of respiratory arrest after IV iodine for kidney study. Immediately treated w/o sequela. No previous or subsequent IVP exposure. Remote hx of respiratory arrest after IV iodine for kidney study. Immediately treated w/o sequela. No previous or subsequent IVP exposure. Remote hx of respiratory arrest after IV iodine for kidney study. Immediately treated w/o sequela. No previous or subsequent IVP exposure.   Iodine Shortness Of Breath    "Quit Breathing"    Family History: Family History  Problem Relation Age of Onset   Breast cancer Cousin    Stroke Mother    Heart disease Paternal Grandmother    Cancer Paternal Grandfather    Stroke Sister    Stroke Brother     Social History:  reports that she has never smoked. She has never used smokeless tobacco. She reports current alcohol use. She reports that she does not use drugs.   Physical Exam: BP 137/69    Pulse 80    Ht 4\' 8"  (1.422 m)    Wt 137 lb (62.1 kg)    BMI 30.71 kg/m   Constitutional:  Alert and oriented, No acute distress. HEENT: [place] AT, moist mucus membranes.  Trachea midline, no masses. Cardiovascular: No clubbing, cyanosis, or edema. Respiratory: Normal respiratory effort, no increased work of breathing. Psychiatric: Normal mood and affect.  Laboratory Data:  Urinalysis Microscopy >30 WBC/many bacteria   Assessment & Plan:    1. Recurrent UTI Persistent infection on previously culture specific antibiotics Will await pending urine culture prior to antibiotic change Bladder scan PVR 28 mL Schedule cystoscopy   Abbie Sons, MD  [facility] [address], [place], [place] [address] [phone]

## 2022-02-15 LAB — URINE CULTURE: Culture: >=100000 — AB

## 2022-02-19 ENCOUNTER — Encounter: Payer: Self-pay | Admitting: Physician Assistant

## 2022-02-19 ENCOUNTER — Ambulatory Visit (INDEPENDENT_AMBULATORY_CARE_PROVIDER_SITE_OTHER): Payer: Medicare HMO | Admitting: Physician Assistant

## 2022-02-19 ENCOUNTER — Other Ambulatory Visit: Payer: Self-pay

## 2022-02-19 VITALS — BP 142/70 | HR 75 | Temp 97.6°F | Resp 16 | Ht <= 58 in | Wt 142.8 lb

## 2022-02-19 DIAGNOSIS — N390 Urinary tract infection, site not specified: Secondary | ICD-10-CM

## 2022-02-19 DIAGNOSIS — N8110 Cystocele, unspecified: Secondary | ICD-10-CM | POA: Diagnosis not present

## 2022-02-19 DIAGNOSIS — I10 Essential (primary) hypertension: Secondary | ICD-10-CM | POA: Diagnosis not present

## 2022-02-19 NOTE — Progress Notes (Signed)
[facility] [place], [place] [address]  Internal MEDICINE  Office Visit Note  Patient Name: Cynthia Keith  [id number]  [id number]  Date of Service: 02/19/2022  Chief Complaint  Patient presents with   Follow-up   Gastroesophageal Reflux   Hyperlipidemia   Hypertension   Quality Metric Gaps    Shingles Vaccine    HPI Pt is here for follow up following ED visit on 02/13/21 -Last Tuesday woke up very tired, had been up several times in the night to urinate. Painful while urinating. So ended up going to the ED. ED provider thought that her prolapse was worsening again. May need to see GYN for evaluation -She had a follow-up with her urologist, Dr. Bernardo Heater the following day. On ABX currently for UTI that has been present for a few weeks now. On trimethoprim 100mg  now as part of an 8-week prophylactic course -Dr. Elenor Quinones is seeing her again on Friday to check her bladder scan in office.  Pending results may send to gynecology for evaluation of possible prolapse -Not having any pain today with urinating -Continues to work 4 days per week and is active at home and is always on the go-has not been checking BP at home but has been taking her medications, BP is stable in office today  Current Medication: Outpatient Encounter Medications as of 02/19/2022  Medication Sig   amLODipine (NORVASC) 10 MG tablet TAKE 1 TABLET BY MOUTH EVERY DAY   aspirin EC 81 MG tablet Take 81 mg by mouth daily.   clobetasol (TEMOVATE) 0.05 % external solution Patient to mix in jar of CeraVe cream. Apply 1-2 times a day as needed for itchy rash. Avoid face, groin, underarms.   clobetasol cream (TEMOVATE) 1.85 % Apply 1 application topically 2 (two) times daily. Apply twice daily to spot treat affected areas as needed for itch. Avoid applying to face, groin, and axilla. Use as directed. Long-term use can cause thinning of the skin.   estradiol (ESTRACE) 0.1 MG/GM vaginal cream Apply one  pea-sized amount around the opening of the urethra daily for 2 weeks, then 3 times weekly thereafter.   hydrochlorothiazide (HYDRODIURIL) 12.5 MG tablet Take 1 tablet (12.5 mg total) by mouth daily.   mirabegron ER (MYRBETRIQ) 25 MG TB24 tablet Take 1 tablet (25 mg total) by mouth daily.   Multiple Vitamin (MULTIVITAMIN) tablet Take 1 tablet by mouth daily.   omeprazole (PRILOSEC) 20 MG capsule Take 1 capsule (20 mg total) by mouth daily.   predniSONE (DELTASONE) 10 MG tablet Take 1 tab po 3 x day for 3 days then take 1 tab po 2 x a day for 3 days and then take 1 tab po daily for 3 days   rosuvastatin (CRESTOR) 5 MG tablet Take 1 tablet (5 mg total) by mouth daily.   SHINGRIX injection    trimethoprim (TRIMPEX) 100 MG tablet Take 1 tablet (100 mg total) by mouth daily.   No facility-administered encounter medications on file as of 02/19/2022.    Surgical History: Past Surgical History:  Procedure Laterality Date   ABDOMINAL HYSTERECTOMY     COLONOSCOPY WITH PROPOFOL N/A 12/12/2015   Procedure: COLONOSCOPY WITH PROPOFOL;  Surgeon: Manya Silvas, MD;  Location: [facility] ENDOSCOPY;  Service: Endoscopy;  Laterality: N/A;   HAND SURGERY  2005    Medical History: Past Medical History:  Diagnosis Date   Actinic keratosis 03/15/2021   L post thigh    Basal cell carcinoma 06/21/2021  L alar crease, pt sched for EDC   GERD (gastroesophageal reflux disease)    Hyperlipidemia    Hypertension    Plantar fasciitis    Sleep apnea    Urinary tract infection     Family History: Family History  Problem Relation Age of Onset   Breast cancer Cousin    Stroke Mother    Heart disease Paternal Grandmother    Cancer Paternal Grandfather    Stroke Sister    Stroke Brother     Social History   Socioeconomic History   Marital status: Married    Spouse name: Not on file   Number of children: Not on file   Years of education: Not on file   Highest education level: Not on file  Occupational  History   Not on file  Tobacco Use   Smoking status: Never   Smokeless tobacco: Never  Vaping Use   Vaping Use: Never used  Substance and Sexual Activity   Alcohol use: Yes    Comment: ocassionally   Drug use: No   Sexual activity: Yes    Birth control/protection: Post-menopausal, Surgical  Other Topics Concern   Not on file  Social History Narrative   Not on file   Social Determinants of Health   Financial Resource Strain: Not on file  Food Insecurity: Not on file  Transportation Needs: Not on file  Physical Activity: Not on file  Stress: Not on file  Social Connections: Not on file  Intimate Partner Violence: Not on file      Review of Systems  Constitutional:  Negative for chills, fatigue and unexpected weight change.  HENT:  Negative for congestion, postnasal drip, rhinorrhea, sneezing and sore throat.   Eyes:  Negative for redness.  Respiratory:  Negative for cough, chest tightness and shortness of breath.   Cardiovascular:  Negative for chest pain and palpitations.  Gastrointestinal:  Negative for abdominal pain, constipation, diarrhea, nausea and vomiting.  Genitourinary:  Negative for dysuria and frequency.  Musculoskeletal:  Negative for arthralgias, back pain, joint swelling and neck pain.  Skin:  Negative for rash.  Neurological: Negative.  Negative for tremors and numbness.  Hematological:  Negative for adenopathy. Does not bruise/bleed easily.  Psychiatric/Behavioral:  Negative for behavioral problems (Depression), sleep disturbance and suicidal ideas. The patient is not nervous/anxious.    Vital Signs: BP (!) 142/70 Comment: 157/86   Pulse 75    Temp 97.6 F (36.4 C)    Resp 16    Ht 4\' 8"  (1.422 m)    Wt 142 lb 12.8 oz (64.8 kg)    SpO2 98%    BMI 32.02 kg/m    Physical Exam Vitals and nursing note reviewed.  Constitutional:      General: She is not in acute distress.    Appearance: She is well-developed. She is obese. She is not diaphoretic.   HENT:     Head: Normocephalic and atraumatic.     Mouth/Throat:     Pharynx: No oropharyngeal exudate.  Eyes:     Pupils: Pupils are equal, round, and reactive to light.  Neck:     Thyroid: No thyromegaly.     Vascular: No JVD.     Trachea: No tracheal deviation.  Cardiovascular:     Rate and Rhythm: Normal rate and regular rhythm.     Heart sounds: Normal heart sounds. No murmur heard.   No friction rub. No gallop.  Pulmonary:     Effort: Pulmonary effort is normal.  No respiratory distress.     Breath sounds: No wheezing or rales.  Chest:     Chest wall: No tenderness.  Abdominal:     General: Bowel sounds are normal.     Palpations: Abdomen is soft.     Tenderness: There is no abdominal tenderness. There is no right CVA tenderness or left CVA tenderness.  Musculoskeletal:        General: Normal range of motion.     Cervical back: Normal range of motion and neck supple.  Lymphadenopathy:     Cervical: No cervical adenopathy.  Skin:    General: Skin is warm and dry.  Neurological:     Mental Status: She is alert and oriented to person, place, and time.     Cranial Nerves: No cranial nerve deficit.  Psychiatric:        Behavior: Behavior normal.        Thought Content: Thought content normal.        Judgment: Judgment normal.       Assessment/Plan: 1. Essential hypertension Stable, continue current medication  2. Recurrent UTI Followed by urology with planned bladder scan this Friday.  Continues to be on trimethoprim 100 mg as prophylaxis  3. Vaginal prolapse ED evaluation consistent with possible vaginal prolapse.  Pending bladder scan results patient may be referred to gynecology for further evaluation of prolapse as contributing to symptoms   General Counseling: dina warbington understanding of the findings of todays visit and agrees with plan of treatment. I have discussed any further diagnostic evaluation that may be needed or ordered today. We also  reviewed her medications today. she has been encouraged to call the office with any questions or concerns that should arise related to todays visit.    No orders of the defined types were placed in this encounter.   No orders of the defined types were placed in this encounter.   This patient was seen by Drema Dallas, PA-C in collaboration with Dr. Clayborn Bigness as a part of collaborative care agreement.   Total time spent:30 Minutes Time spent includes review of chart, medications, test results, and follow up plan with the patient.      Dr Lavera Guise Internal medicine

## 2022-02-23 ENCOUNTER — Other Ambulatory Visit: Payer: Self-pay

## 2022-02-23 ENCOUNTER — Ambulatory Visit (INDEPENDENT_AMBULATORY_CARE_PROVIDER_SITE_OTHER): Payer: Medicare HMO | Admitting: Urology

## 2022-02-23 ENCOUNTER — Encounter: Payer: Self-pay | Admitting: Urology

## 2022-02-23 VITALS — BP 139/76 | HR 76 | Ht <= 58 in | Wt 136.0 lb

## 2022-02-23 DIAGNOSIS — N390 Urinary tract infection, site not specified: Secondary | ICD-10-CM | POA: Diagnosis not present

## 2022-02-23 LAB — URINALYSIS, COMPLETE
Bilirubin, UA: NEGATIVE
Glucose, UA: NEGATIVE
Ketones, UA: NEGATIVE
Nitrite, UA: NEGATIVE
Protein,UA: NEGATIVE
RBC, UA: NEGATIVE
Specific Gravity, UA: 1.01 (ref 1.005–1.030)
Urobilinogen, Ur: 0.2 mg/dL (ref 0.2–1.0)
pH, UA: 5.5 (ref 5.0–7.5)

## 2022-02-23 LAB — MICROSCOPIC EXAMINATION: WBC, UA: >30 /hpf — AB (ref 0–5)

## 2022-02-23 NOTE — Progress Notes (Signed)
? ?  02/23/22 ? ?CC:  ?Chief Complaint  ?Patient presents with  ? Cysto  ? ? ?HPI: 76 y.o. female with persistent UTI.  See my prior office note 02/14/2022. ? ?Blood pressure 139/76, pulse 76, height 4\' 8"  (1.422 m), weight 136 lb (61.7 kg). ?NED. A&Ox3.   ?No respiratory distress   ?Abd soft, NT, ND ?Atrophic external genitalia with patent urethral meatus.  + Rectocele ? ?Cystoscopy Procedure Note ? ?Patient identification was confirmed, informed consent was obtained, and patient was prepped using Betadine solution.  Lidocaine jelly was administered per urethral meatus.   ? ?Procedure: ?- Flexible cystoscope introduced, without any difficulty.   ?- Thorough search of the bladder revealed: ?   normal urethral meatus ?   normal urothelium ?   no stones ?   no ulcers  ?   no tumors ?   no urethral polyps ?   no trabeculation ? ?- Ureteral orifices were normal in position and appearance. ? ?Post-Procedure: ?- Patient tolerated the procedure well ? ?Assessment/ Plan: ?No mucosal abnormalities noted ?UA today with persistent pyuria and bacteriuria ?Repeat urine culture ordered ? ? ? ?Abbie Sons, MD ? ?

## 2022-02-25 ENCOUNTER — Encounter: Payer: Self-pay | Admitting: Urology

## 2022-02-27 ENCOUNTER — Telehealth: Payer: Self-pay | Admitting: Urology

## 2022-02-27 DIAGNOSIS — Z20822 Contact with and (suspected) exposure to covid-19: Secondary | ICD-10-CM | POA: Diagnosis not present

## 2022-02-27 DIAGNOSIS — J309 Allergic rhinitis, unspecified: Secondary | ICD-10-CM | POA: Diagnosis not present

## 2022-02-27 NOTE — Telephone Encounter (Signed)
Left message at the Doctor will look over her labs and we will call her back  ?

## 2022-02-27 NOTE — Telephone Encounter (Signed)
Patient calling about her lab results. ?

## 2022-03-12 ENCOUNTER — Other Ambulatory Visit: Payer: Self-pay

## 2022-03-12 ENCOUNTER — Encounter: Payer: Self-pay | Admitting: Dermatology

## 2022-03-12 ENCOUNTER — Ambulatory Visit: Payer: Medicare HMO | Admitting: Dermatology

## 2022-03-12 DIAGNOSIS — Z85828 Personal history of other malignant neoplasm of skin: Secondary | ICD-10-CM | POA: Diagnosis not present

## 2022-03-12 DIAGNOSIS — D225 Melanocytic nevi of trunk: Secondary | ICD-10-CM | POA: Diagnosis not present

## 2022-03-12 DIAGNOSIS — L300 Nummular dermatitis: Secondary | ICD-10-CM

## 2022-03-12 DIAGNOSIS — L918 Other hypertrophic disorders of the skin: Secondary | ICD-10-CM

## 2022-03-12 DIAGNOSIS — D2372 Other benign neoplasm of skin of left lower limb, including hip: Secondary | ICD-10-CM

## 2022-03-12 DIAGNOSIS — L814 Other melanin hyperpigmentation: Secondary | ICD-10-CM

## 2022-03-12 DIAGNOSIS — L309 Dermatitis, unspecified: Secondary | ICD-10-CM

## 2022-03-12 DIAGNOSIS — L820 Inflamed seborrheic keratosis: Secondary | ICD-10-CM | POA: Diagnosis not present

## 2022-03-12 DIAGNOSIS — L304 Erythema intertrigo: Secondary | ICD-10-CM

## 2022-03-12 DIAGNOSIS — D1800 Hemangioma unspecified site: Secondary | ICD-10-CM | POA: Diagnosis not present

## 2022-03-12 DIAGNOSIS — D229 Melanocytic nevi, unspecified: Secondary | ICD-10-CM | POA: Diagnosis not present

## 2022-03-12 DIAGNOSIS — I8393 Asymptomatic varicose veins of bilateral lower extremities: Secondary | ICD-10-CM

## 2022-03-12 DIAGNOSIS — Z1283 Encounter for screening for malignant neoplasm of skin: Secondary | ICD-10-CM | POA: Diagnosis not present

## 2022-03-12 DIAGNOSIS — L578 Other skin changes due to chronic exposure to nonionizing radiation: Secondary | ICD-10-CM

## 2022-03-12 DIAGNOSIS — L821 Other seborrheic keratosis: Secondary | ICD-10-CM

## 2022-03-12 NOTE — Patient Instructions (Addendum)
For Redness and itch under breast  ? ? Zeasorb antifungal powder - can use after shower to help with moisture under breast  ?Can also use over the counter hydrocortisone at area under breast for itch and redness. (Found in the athletes foot section of [facility].) ? ? ?For itchy spots at legs ? ?Can continue use Clobetasol just as needed to itchy areas of legs for up to 2 weeks. Avoid applying to face, groin, and axilla. Use as directed. Long-term use can cause thinning of the skin. ?Topical steroids (such as triamcinolone, fluocinolone, fluocinonide, mometasone, clobetasol, halobetasol, betamethasone, hydrocortisone) can cause thinning and lightening of the skin if they are used for too long in the same area. Your physician has selected the right strength medicine for your problem and area affected on the body. Please use your medication only as directed by your physician to prevent side effects.  ? ? ? ?Seborrheic Keratosis ? ?What causes seborrheic keratoses? ?Seborrheic keratoses are harmless, common skin growths that first appear during adult life.  As time goes by, more growths appear.  Some people may develop a large number of them.  Seborrheic keratoses appear on both covered and uncovered body parts.  They are not caused by sunlight.  The tendency to develop seborrheic keratoses can be inherited.  They vary in color from skin-colored to gray, brown, or even black.  They can be either smooth or have a rough, warty surface.   ?Seborrheic keratoses are superficial and look as if they were stuck on the skin.  Under the microscope this type of keratosis looks like layers upon layers of skin.  That is why at times the top layer may seem to fall off, but the rest of the growth remains and re-grows.   ? ?Treatment ?Seborrheic keratoses do not need to be treated, but can easily be removed in the office.  Seborrheic keratoses often cause symptoms when they rub on clothing or jewelry.  Lesions can be in the way of  shaving.  If they become inflamed, they can cause itching, soreness, or burning.  Removal of a seborrheic keratosis can be accomplished by freezing, burning, or surgery. ?If any spot bleeds, scabs, or grows rapidly, please return to have it checked, as these can be an indication of a skin cancer. ? ?Recommend starting moisturizer with exfoliant (Urea, Salicylic acid, or Lactic acid) one to two times daily to help smooth rough and bumpy skin.  OTC options include Cetaphil Rough and Bumpy lotion (Urea), Eucerin Roughness Relief lotion or spot treatment cream (Urea), CeraVe SA lotion/cream for Rough and Bumpy skin (Sal Acid), Gold Bond Rough and Bumpy cream (Sal Acid), and AmLactin 12% lotion/cream (Lactic Acid).  If applying in morning, also apply sunscreen to sun-exposed areas, since these exfoliating moisturizers can increase sensitivity to sun. ? ? ?Cryotherapy Aftercare ? ?Wash gently with soap and water everyday.   ?Apply Vaseline and Band-Aid daily until healed.  ? ? ? ?Melanoma ABCDEs ? ?Melanoma is the most dangerous type of skin cancer, and is the leading cause of death from skin disease.  You are more likely to develop melanoma if you: ?Have light-colored skin, light-colored eyes, or red or blond hair ?Spend a lot of time in the sun ?Tan regularly, either outdoors or in a tanning bed ?Have had blistering sunburns, especially during childhood ?Have a close family member who has had a melanoma ?Have atypical moles or large birthmarks ? ?Early detection of melanoma is key since treatment is typically straightforward  and cure rates are extremely high if we catch it early.  ? ?The first sign of melanoma is often a change in a mole or a new dark spot.  The ABCDE system is a way of remembering the signs of melanoma. ? ?A for asymmetry:  The two halves do not match. ?B for border:  The edges of the growth are irregular. ?C for color:  A mixture of colors are present instead of an even brown color. ?D for diameter:   Melanomas are usually (but not always) greater than 69m - the size of a pencil eraser. ?E for evolution:  The spot keeps changing in size, shape, and color. ? ?Please check your skin once per month between visits. You can use a small mirror in front and a large mirror behind you to keep an eye on the back side or your body.  ? ?If you see any new or changing lesions before your next follow-up, please call to schedule a visit. ? ?Please continue daily skin protection including broad spectrum sunscreen SPF 30+ to sun-exposed areas, reapplying every 2 hours as needed when you're outdoors.  ? ?Staying in the shade or wearing long sleeves, sun glasses (UVA+UVB protection) and wide brim hats (4-inch brim around the entire circumference of the hat) are also recommended for sun protection.   ? ? ?If You Need Anything After Your Visit ? ?If you have any questions or concerns for your doctor, please call our main line at [phone] press option 4 to reach your doctor's medical assistant. If no one answers, please leave a voicemail as directed and we will return your call as soon as possible. Messages left after 4 pm will be answered the following business day.  ? ?You may also send [place] message via MyChart. We typically respond to MyChart messages within 1-2 business days. ? ?For prescription refills, please ask your pharmacy to contact our office. Our fax number is [phone] ? ?If you have an urgent issue when the clinic is closed that cannot wait until the next business day, you can page your doctor at the number below.   ? ?Please note that while we do our best to be available for urgent issues outside of office hours, we are not available 24/7.  ? ?If you have an urgent issue and are unable to reach [place] you may choose to seek medical care at your doctor's office, retail clinic, urgent care center, or emergency room. ? ?If you have a medical emergency, please immediately call 911 or go to the emergency  department. ? ?Pager Numbers ? ?- Dr. KNehemiah Massed [phone]? ?- Dr. MLaurence Ferrari [phone]? ?- Dr. SNicole Kindred [phone]? ?In the event of inclement weather, please call our main line at [phone] an update on the status of any delays or closures. ? ?Dermatology Medication Tips: ?Please keep the boxes that topical medications come in in order to help keep track of the instructions about where and how to use these. Pharmacies typically print the medication instructions only on the boxes and not directly on the medication tubes.  ? ?If your medication is too expensive, please contact our office at [phone] 4 or send [place] message through [place]  ? ?We are unable to tell what your co-pay for medications will be in advance as this is different depending on your insurance coverage. However, we may be able to find a substitute medication at lower cost or fill out paperwork to get insurance to  cover a needed medication.  ? ?If a prior authorization is required to get your medication covered by your insurance company, please allow [place] 1-2 business days to complete this process. ? ?Drug prices often vary depending on where the prescription is filled and some pharmacies may offer cheaper prices. ? ?The website www.goodrx.com contains coupons for medications through different pharmacies. The prices here do not account for what the cost may be with help from insurance (it may be cheaper with your insurance), but the website can give you the price if you did not use any insurance.  ?- You can print the associated coupon and take it with your prescription to the pharmacy.  ?- You may also stop by our office during regular business hours and pick up a GoodRx coupon card.  ?- If you need your prescription sent electronically to a different pharmacy, notify our office through [facility] or by phone at [phone] option 4. ? ? ? ? ?Si Usted Necesita Algo Despu?s de Su Visita ? ?Tambi?n puede enviarnos un  mensaje a trav?s de MyChart. Por lo general respondemos a los mensajes de MyChart en el transcurso de 1 a 2 d?as h?biles. ? ?Para renovar recetas, por favor pida a su farmacia que se ponga en contacto con nuestra o

## 2022-03-12 NOTE — Progress Notes (Signed)
? ?Follow-Up Visit ?  ?Subjective  ?Cynthia Keith is a 76 y.o. female who presents for the following: Follow-up (Patient here today for tbse and eczema follow up. Patient reports some itchy areas at neck and back. She also reports itchy bumps at legs and thighs. States clobetasol has been helping with eczema at lower right leg and at right foot. Patient has history of bcc. ).  Some itchy bumps at lower legs and thighs.  Moles at neck and lower back sometimes itchy ? ?The patient presents for Total-Body Skin Exam (TBSE) for skin cancer screening and mole check.  The patient has spots, moles and lesions to be evaluated, some may be new or changing and the patient has concerns that these could be cancer. ? ? ?The following portions of the chart were reviewed this encounter and updated as appropriate:   ?  ? ?Review of Systems: No other skin or systemic complaints except as noted in HPI or Assessment and Plan. ? ? ?Objective  ?Well appearing patient in no apparent distress; mood and affect are within normal limits. ? ?A full examination was performed including scalp, head, eyes, ears, nose, lips, neck, chest, axillae, abdomen, back, buttocks, bilateral upper extremities, bilateral lower extremities, hands, feet, fingers, toes, fingernails, and toenails. All findings within normal limits unless otherwise noted below. ? ?right lower leg and right foot ?Few scattered pink/brown scaly papules- improved ? ?left posterior thigh x 1, left neck x 10, right neck x 5, right chest x 1, left chest x 1, (18) ?Erythematous stuck-on, waxy papule ? ?right inframammary ?8 x 3 mm medium dark brown macule  ? ?right mid back at braline ?4 mm medium dark brown macule  ? ?left gluteal cleft ?Pink flesh papule ? ?bilateral inframammary folds ?Mild erythema under bilateral breast  ? ? ?Assessment & Plan  ?Lentigines ?- Scattered tan macules ?- Due to sun exposure ?- Benign-appearing, observe ?- Recommend daily broad spectrum sunscreen SPF  30+ to sun-exposed areas, reapply every 2 hours as needed. ?- Call for any changes ? ?Acrochordons (Skin Tags) ?- Fleshy, skin-colored pedunculated papules ?- Benign appearing.  ?- Observe. ?- If desired, they can be removed with an in office procedure that is not covered by insurance. ?- Please call the clinic if you notice any new or changing lesions. ? ?Varicose Veins/Spider Veins ?- Dilated blue, purple or red veins at the lower extremities ?- Reassured ?- Smaller vessels can be treated by sclerotherapy (a procedure to inject a medicine into the veins to make them disappear) if desired, but the treatment is not covered by insurance. Larger vessels may be covered if symptomatic and we would refer to vascular surgeon if treatment desired. ? ?Nevus Spilus ?[place]ark macules or papules within lighter tan patch 4.0 cm at left posterior thigh  ?- Genetic ?- Benign, observe ?- Call for any changes ? ?Seborrheic Keratoses ?- Stuck-on, waxy, tan-brown papules and/or plaques  ?- Benign-appearing ?- Discussed benign etiology and prognosis. ?- Observe ?- Call for any changes ? ?Melanocytic Nevi ?- Tan-brown and/or pink-flesh-colored symmetric macules and papules ?- Benign appearing on exam today ?- Observation ?- Call clinic for new or changing moles ?- Recommend daily use of broad spectrum spf 30+ sunscreen to sun-exposed areas.  ? ?Hemangiomas ?- Red papules ?- Discussed benign nature ?- Observe ?- Call for any changes ? ?Actinic Damage ?- Chronic condition, secondary to cumulative UV/sun exposure ?- diffuse scaly erythematous macules with underlying dyspigmentation ?- Recommend daily broad spectrum sunscreen  SPF 30+ to sun-exposed areas, reapply every 2 hours as needed.  ?- Staying in the shade or wearing long sleeves, sun glasses (UVA+UVB protection) and wide brim hats (4-inch brim around the entire circumference of the hat) are also recommended for sun protection.  ?- Call for new or changing lesions. ? ?History of Basal  Cell Carcinoma of the Skin ?- No evidence of recurrence today at left alar crease 2022 ?- Recommend regular full body skin exams ?- Recommend daily broad spectrum sunscreen SPF 30+ to sun-exposed areas, reapply every 2 hours as needed.  ?- Call if any new or changing lesions are noted between office visits ?Dermatitis ?right lower leg and right foot ? ?Nummular- improving ?  ?Continue clobetasol 0.05% cream twice daily to spot treat affected areas as needed for itch and rash. Avoid applying to face, groin, and axilla. Use as directed. Long-term use can cause thinning of the skin. ?  ?Topical steroids (such as triamcinolone, fluocinolone, fluocinonide, mometasone, clobetasol, halobetasol, betamethasone, hydrocortisone) can cause thinning and lightening of the skin if they are used for too long in the same area. Your physician has selected the right strength medicine for your problem and area affected on the body. Please use your medication only as directed by your physician to prevent side effects.  ? ?Recommend mild soap and moisturizing cream 1-2 times daily.  Gentle skin care handout provided.   ? ?Related Medications ?clobetasol cream (TEMOVATE) 0.05 % ?Apply 1 application topically 2 (two) times daily. Apply twice daily to spot treat affected areas as needed for itch. Avoid applying to face, groin, and axilla. Use as directed. Long-term use can cause thinning of the skin. ? ?Inflamed seborrheic keratosis (18) ?left posterior thigh x 1, left neck x 10, right neck x 5, right chest x 1, left chest x 1, ? ?Isk vs irritated skin tags at right and left neck ? ?Destruction of lesion - left posterior thigh x 1, left neck x 10, right neck x 5, right chest x 1, left chest x 1, ? ?Destruction method: cryotherapy   ?Informed consent: discussed and consent obtained   ?Lesion destroyed using liquid nitrogen: Yes   ?Region frozen until ice ball extended beyond lesion: Yes   ?Outcome: patient tolerated procedure well with no  complications   ?Post-procedure details: wound care instructions given   ?Additional details:  Prior to procedure, discussed risks of blister formation, small wound, skin dyspigmentation, or rare scar following cryotherapy. Recommend Vaseline ointment to treated areas while healing. ? ? ?Nevus (3) ?right inframammary; right mid back at braline; left gluteal cleft ? ?Benign-appearing.  Stable. Observation.  Call clinic for new or changing lesions.  Recommend daily use of broad spectrum spf 30+ sunscreen to sun-exposed areas.  ? ? ?Erythema intertrigo ?bilateral inframammary folds ? ?Intertrigo is a chronic recurrent rash that occurs in skin fold areas that may be associated with friction; heat; moisture; yeast; fungus; and bacteria.  It is exacerbated by increased movement / activity; sweating; and higher atmospheric temperature. ? ?Recommend Zeasorb antifungal powder  can use after shower to help with moisture under breast  ?Avoid Clobetasol in this areas (pt has applied it there in past) ?Can use OTC 1% HC cream prn itch ? ?  ?Skin cancer screening performed today. ?Return for 6 month check face nose h/o bcc . ?I, Ruthell Rummage, CMA, am acting as scribe for Brendolyn Patty, MD. ? ?Documentation: I have reviewed the above documentation for accuracy and completeness, and I agree with  the above. ? ?Brendolyn Patty MD  ? ?

## 2022-03-26 ENCOUNTER — Ambulatory Visit: Payer: Medicare HMO | Admitting: Obstetrics & Gynecology

## 2022-03-26 ENCOUNTER — Encounter: Payer: Medicare HMO | Admitting: Dermatology

## 2022-03-26 ENCOUNTER — Other Ambulatory Visit: Payer: Self-pay | Admitting: Obstetrics & Gynecology

## 2022-03-26 ENCOUNTER — Telehealth: Payer: Self-pay

## 2022-03-26 ENCOUNTER — Ambulatory Visit (INDEPENDENT_AMBULATORY_CARE_PROVIDER_SITE_OTHER): Payer: Medicare HMO

## 2022-03-26 DIAGNOSIS — R300 Dysuria: Secondary | ICD-10-CM | POA: Diagnosis not present

## 2022-03-26 LAB — POCT URINALYSIS DIPSTICK
Bilirubin, UA: NEGATIVE
Blood, UA: NEGATIVE
Glucose, UA: NEGATIVE
Ketones, UA: NEGATIVE
Nitrite, UA: NEGATIVE
Protein, UA: NEGATIVE
Spec Grav, UA: 1.01 (ref 1.010–1.025)
Urobilinogen, UA: 0.2 E.U./dL
pH, UA: 5 (ref 5.0–8.0)

## 2022-03-26 MED ORDER — SULFAMETHOXAZOLE-TRIMETHOPRIM 800-160 MG PO TABS: 1.0000 | ORAL_TABLET | Freq: Two times a day (BID) | ORAL | 0 refills | Status: AC

## 2022-03-26 NOTE — Telephone Encounter (Signed)
Pt was on your schedule for a uti today, we had her drop off the urine I did the urine dip and ordered a culture Positive for Leukocytes, she reports a lot of pain with urination, can you send in a antibiotic now or prefer to wait on results? ?

## 2022-03-26 NOTE — Telephone Encounter (Signed)
Left message to let her know you sent in RX.Marland KitchenMarland KitchenNo She's going to make an appointment for prolapse.

## 2022-03-26 NOTE — Telephone Encounter (Signed)
I will call in now.  Does she need anything else?

## 2022-03-30 LAB — URINE CULTURE

## 2022-04-07 DIAGNOSIS — M1990 Unspecified osteoarthritis, unspecified site: Secondary | ICD-10-CM | POA: Diagnosis not present

## 2022-04-07 DIAGNOSIS — Z6830 Body mass index (BMI) 30.0-30.9, adult: Secondary | ICD-10-CM | POA: Diagnosis not present

## 2022-04-07 DIAGNOSIS — Z008 Encounter for other general examination: Secondary | ICD-10-CM | POA: Diagnosis not present

## 2022-04-07 DIAGNOSIS — K219 Gastro-esophageal reflux disease without esophagitis: Secondary | ICD-10-CM | POA: Diagnosis not present

## 2022-04-07 DIAGNOSIS — Z96649 Presence of unspecified artificial hip joint: Secondary | ICD-10-CM | POA: Diagnosis not present

## 2022-04-07 DIAGNOSIS — Z7982 Long term (current) use of aspirin: Secondary | ICD-10-CM | POA: Diagnosis not present

## 2022-04-07 DIAGNOSIS — Z8249 Family history of ischemic heart disease and other diseases of the circulatory system: Secondary | ICD-10-CM | POA: Diagnosis not present

## 2022-04-07 DIAGNOSIS — G629 Polyneuropathy, unspecified: Secondary | ICD-10-CM | POA: Diagnosis not present

## 2022-04-07 DIAGNOSIS — E785 Hyperlipidemia, unspecified: Secondary | ICD-10-CM | POA: Diagnosis not present

## 2022-04-07 DIAGNOSIS — R32 Unspecified urinary incontinence: Secondary | ICD-10-CM | POA: Diagnosis not present

## 2022-04-07 DIAGNOSIS — E669 Obesity, unspecified: Secondary | ICD-10-CM | POA: Diagnosis not present

## 2022-04-07 DIAGNOSIS — Z791 Long term (current) use of non-steroidal anti-inflammatories (NSAID): Secondary | ICD-10-CM | POA: Diagnosis not present

## 2022-04-07 DIAGNOSIS — I10 Essential (primary) hypertension: Secondary | ICD-10-CM | POA: Diagnosis not present

## 2022-05-02 ENCOUNTER — Telehealth: Payer: Medicare HMO

## 2022-05-30 ENCOUNTER — Other Ambulatory Visit: Payer: Self-pay | Admitting: Physician Assistant

## 2022-06-10 ENCOUNTER — Other Ambulatory Visit: Payer: Self-pay | Admitting: Physician Assistant

## 2022-06-10 DIAGNOSIS — I10 Essential (primary) hypertension: Secondary | ICD-10-CM

## 2022-06-19 ENCOUNTER — Telehealth: Payer: Self-pay | Admitting: Urology

## 2022-06-21 ENCOUNTER — Ambulatory Visit: Payer: Medicare HMO | Admitting: Physician Assistant

## 2022-06-25 ENCOUNTER — Ambulatory Visit (INDEPENDENT_AMBULATORY_CARE_PROVIDER_SITE_OTHER): Payer: Medicare HMO | Admitting: Physician Assistant

## 2022-06-25 ENCOUNTER — Encounter: Payer: Self-pay | Admitting: Physician Assistant

## 2022-06-25 DIAGNOSIS — I10 Essential (primary) hypertension: Secondary | ICD-10-CM

## 2022-06-25 DIAGNOSIS — E559 Vitamin D deficiency, unspecified: Secondary | ICD-10-CM | POA: Diagnosis not present

## 2022-06-25 DIAGNOSIS — R5383 Other fatigue: Secondary | ICD-10-CM

## 2022-06-25 DIAGNOSIS — E538 Deficiency of other specified B group vitamins: Secondary | ICD-10-CM

## 2022-06-25 DIAGNOSIS — E782 Mixed hyperlipidemia: Secondary | ICD-10-CM

## 2022-06-25 DIAGNOSIS — R7989 Other specified abnormal findings of blood chemistry: Secondary | ICD-10-CM | POA: Diagnosis not present

## 2022-06-25 NOTE — Progress Notes (Signed)
[facility] [place], [place] [address]  Internal MEDICINE  Office Visit Note  Patient Name: Cynthia Keith  [id number]  [id number]  Date of Service: 07/03/2022  Chief Complaint  Patient presents with   Follow-up   Hypertension   Hyperlipidemia   Gastroesophageal Reflux    HPI Pt is here for routine follow up -Sciatica has been worse lately and is seeing Dr. Oswaldo Milian and may see about follow up today. Has some pain along outside of left lower leg that is a little tender and hurts to lay on. Reports she has been getting acupuncture treatments with him. No swelling. -Did trip and have a fall last week, but did not hurt anything -husband was in the hospital for bowel resection and now brother in the hospital with stroke and UTI and she is checking on them. Has been a lot on her plate on top of still working regularly. States she is managing though -BP 967E systolic at home -Due for routine fasting labs  Current Medication: Outpatient Encounter Medications as of 06/25/2022  Medication Sig   amLODipine (NORVASC) 10 MG tablet TAKE 1 TABLET BY MOUTH EVERY DAY   aspirin EC 81 MG tablet Take 81 mg by mouth daily.   clobetasol (TEMOVATE) 0.05 % external solution Patient to mix in jar of CeraVe cream. Apply 1-2 times a day as needed for itchy rash. Avoid face, groin, underarms.   clobetasol cream (TEMOVATE) 9.38 % Apply 1 application topically 2 (two) times daily. Apply twice daily to spot treat affected areas as needed for itch. Avoid applying to face, groin, and axilla. Use as directed. Long-term use can cause thinning of the skin.   estradiol (ESTRACE) 0.1 MG/GM vaginal cream Apply one pea-sized amount around the opening of the urethra daily for 2 weeks, then 3 times weekly thereafter.   hydrochlorothiazide (HYDRODIURIL) 12.5 MG tablet TAKE 1 TABLET BY MOUTH EVERY DAY   mirabegron ER (MYRBETRIQ) 25 MG TB24 tablet Take 1 tablet (25 mg total) by mouth daily.   Multiple  Vitamin (MULTIVITAMIN) tablet Take 1 tablet by mouth daily.   omeprazole (PRILOSEC) 20 MG capsule TAKE 1 CAPSULE BY MOUTH EVERY DAY   predniSONE (DELTASONE) 10 MG tablet Take 1 tab po 3 x day for 3 days then take 1 tab po 2 x a day for 3 days and then take 1 tab po daily for 3 days   rosuvastatin (CRESTOR) 5 MG tablet Take 1 tablet (5 mg total) by mouth daily.   SHINGRIX injection    trimethoprim (TRIMPEX) 100 MG tablet Take 1 tablet (100 mg total) by mouth daily.   No facility-administered encounter medications on file as of 06/25/2022.    Surgical History: Past Surgical History:  Procedure Laterality Date   ABDOMINAL HYSTERECTOMY     COLONOSCOPY WITH PROPOFOL N/A 12/12/2015   Procedure: COLONOSCOPY WITH PROPOFOL;  Surgeon: Manya Silvas, MD;  Location: [facility] ENDOSCOPY;  Service: Endoscopy;  Laterality: N/A;   HAND SURGERY  2005    Medical History: Past Medical History:  Diagnosis Date   Actinic keratosis 03/15/2021   L post thigh    Basal cell carcinoma 06/21/2021   L alar crease, EDC done 07/31/21   GERD (gastroesophageal reflux disease)    Hyperlipidemia    Hypertension    Plantar fasciitis    Sleep apnea    Urinary tract infection     Family History: Family History  Problem Relation Age of Onset   Breast cancer Cousin  Stroke Mother    Heart disease Paternal Grandmother    Cancer Paternal Grandfather    Stroke Sister    Stroke Brother     Social History   Socioeconomic History   Marital status: Married    Spouse name: Not on file   Number of children: Not on file   Years of education: Not on file   Highest education level: Not on file  Occupational History   Not on file  Tobacco Use   Smoking status: Never   Smokeless tobacco: Never  Vaping Use   Vaping Use: Never used  Substance and Sexual Activity   Alcohol use: Yes    Comment: ocassionally   Drug use: No   Sexual activity: Yes    Birth control/protection: Post-menopausal, Surgical  Other  Topics Concern   Not on file  Social History Narrative   Not on file   Social Determinants of Health   Financial Resource Strain: Not on file  Food Insecurity: Not on file  Transportation Needs: Not on file  Physical Activity: Not on file  Stress: Not on file  Social Connections: Not on file  Intimate Partner Violence: Not on file      Review of Systems  Constitutional:  Negative for chills, fatigue and unexpected weight change.  HENT:  Negative for congestion, postnasal drip, rhinorrhea, sneezing and sore throat.   Eyes:  Negative for redness.  Respiratory:  Negative for cough, chest tightness and shortness of breath.   Cardiovascular:  Negative for chest pain and palpitations.  Gastrointestinal:  Negative for abdominal pain, constipation, diarrhea, nausea and vomiting.  Genitourinary:  Negative for dysuria and frequency.  Musculoskeletal:  Negative for arthralgias, back pain, joint swelling and neck pain.  Skin:  Negative for rash.  Neurological: Negative.  Negative for tremors and numbness.  Hematological:  Negative for adenopathy. Does not bruise/bleed easily.  Psychiatric/Behavioral:  Negative for behavioral problems (Depression), sleep disturbance and suicidal ideas. The patient is nervous/anxious.     Vital Signs: BP (!) 142/80   Pulse 65   Temp 97.8 F (36.6 C)   Resp 16   Ht '4\' 8"'$  (1.422 m)   Wt 143 lb (64.9 kg)   SpO2 99%   BMI 32.06 kg/m    Physical Exam Vitals and nursing note reviewed.  Constitutional:      General: She is not in acute distress.    Appearance: She is well-developed. She is obese. She is not diaphoretic.  HENT:     Head: Normocephalic and atraumatic.     Mouth/Throat:     Pharynx: No oropharyngeal exudate.  Eyes:     Pupils: Pupils are equal, round, and reactive to light.  Neck:     Thyroid: No thyromegaly.     Vascular: No JVD.     Trachea: No tracheal deviation.  Cardiovascular:     Rate and Rhythm: Normal rate and regular  rhythm.     Heart sounds: Normal heart sounds. No murmur heard.    No friction rub. No gallop.  Pulmonary:     Effort: Pulmonary effort is normal. No respiratory distress.     Breath sounds: No wheezing or rales.  Chest:     Chest wall: No tenderness.  Abdominal:     General: Bowel sounds are normal.     Palpations: Abdomen is soft.     Tenderness: There is no abdominal tenderness. There is no right CVA tenderness or left CVA tenderness.  Musculoskeletal:  General: No swelling. Normal range of motion.     Cervical back: Normal range of motion and neck supple.     Right lower leg: No edema.     Left lower leg: No edema.  Lymphadenopathy:     Cervical: No cervical adenopathy.  Skin:    General: Skin is warm and dry.  Neurological:     Mental Status: She is alert and oriented to person, place, and time.     Cranial Nerves: No cranial nerve deficit.  Psychiatric:        Behavior: Behavior normal.        Thought Content: Thought content normal.        Judgment: Judgment normal.        Assessment/Plan: 1. Essential hypertension Mildly elevated, continue current medications  2. Mixed hyperlipidemia - Lipid Panel With LDL/HDL Ratio  3. Vitamin B12 deficiency - B12 and Folate Panel  4. Vitamin D deficiency - VITAMIN D 25 Hydroxy (Vit-D Deficiency, Fractures)  5. Abnormal thyroid blood test - TSH + free T4  6. Other fatigue - CBC w/Diff/Platelet - Comprehensive metabolic panel - Lipid Panel With LDL/HDL Ratio - TSH + free T4 - B12 and Folate Panel - VITAMIN D 25 Hydroxy (Vit-D Deficiency, Fractures)   General Counseling: Patsy Lager understanding of the findings of todays visit and agrees with plan of treatment. I have discussed any further diagnostic evaluation that may be needed or ordered today. We also reviewed her medications today. she has been encouraged to call the office with any questions or concerns that should arise related to todays  visit.    Orders Placed This Encounter  Procedures   CBC w/Diff/Platelet   Comprehensive metabolic panel   Lipid Panel With LDL/HDL Ratio   TSH + free T4   B12 and Folate Panel   VITAMIN D 25 Hydroxy (Vit-D Deficiency, Fractures)    No orders of the defined types were placed in this encounter.   This patient was seen by Drema Dallas, PA-C in collaboration with Dr. Clayborn Bigness as a part of collaborative care agreement.   Total time spent:30 Minutes Time spent includes review of chart, medications, test results, and follow up plan with the patient.      Dr Lavera Guise Internal medicine

## 2022-06-29 ENCOUNTER — Other Ambulatory Visit: Payer: Medicare HMO

## 2022-07-02 ENCOUNTER — Ambulatory Visit: Payer: Medicare HMO | Admitting: Oncology

## 2022-07-23 ENCOUNTER — Inpatient Hospital Stay: Payer: Medicare HMO | Attending: Oncology

## 2022-07-23 DIAGNOSIS — R7989 Other specified abnormal findings of blood chemistry: Secondary | ICD-10-CM

## 2022-07-23 LAB — CBC WITH DIFFERENTIAL/PLATELET
Abs Immature Granulocytes: 0.03 10*3/uL (ref 0.00–0.07)
Basophils Absolute: 0.1 10*3/uL (ref 0.0–0.1)
Basophils Relative: 1 %
Eosinophils Absolute: 0.4 10*3/uL (ref 0.0–0.5)
Eosinophils Relative: 5 %
HCT: 43.6 % (ref 36.0–46.0)
Hemoglobin: 15.2 g/dL — ABNORMAL HIGH (ref 12.0–15.0)
Immature Granulocytes: 0 %
Lymphocytes Relative: 32 %
Lymphs Abs: 2.5 10*3/uL (ref 0.7–4.0)
MCH: 31.7 pg (ref 26.0–34.0)
MCHC: 34.9 g/dL (ref 30.0–36.0)
MCV: 90.8 fL (ref 80.0–100.0)
Monocytes Absolute: 1 10*3/uL (ref 0.1–1.0)
Monocytes Relative: 12 %
Neutro Abs: 4 10*3/uL (ref 1.7–7.7)
Neutrophils Relative %: 50 %
Platelets: 234 10*3/uL (ref 150–400)
RBC: 4.8 MIL/uL (ref 3.87–5.11)
RDW: 12.7 % (ref 11.5–15.5)
WBC: 7.9 10*3/uL (ref 4.0–10.5)
nRBC: 0 % (ref 0.0–0.2)

## 2022-07-23 LAB — FERRITIN: Ferritin: 94 ng/mL (ref 11–307)

## 2022-07-23 LAB — IRON AND TIBC
Iron: 132 ug/dL (ref 28–170)
Saturation Ratios: 34 % — ABNORMAL HIGH (ref 10.4–31.8)
TIBC: 389 ug/dL (ref 250–450)
UIBC: 257 ug/dL

## 2022-07-24 ENCOUNTER — Inpatient Hospital Stay: Payer: Medicare HMO | Attending: Oncology | Admitting: Oncology

## 2022-07-24 ENCOUNTER — Encounter: Payer: Self-pay | Admitting: Oncology

## 2022-07-24 ENCOUNTER — Inpatient Hospital Stay: Payer: Medicare HMO

## 2022-07-24 DIAGNOSIS — M79662 Pain in left lower leg: Secondary | ICD-10-CM | POA: Insufficient documentation

## 2022-07-24 DIAGNOSIS — Z79899 Other long term (current) drug therapy: Secondary | ICD-10-CM | POA: Insufficient documentation

## 2022-07-24 DIAGNOSIS — D751 Secondary polycythemia: Secondary | ICD-10-CM | POA: Diagnosis not present

## 2022-07-24 NOTE — Progress Notes (Signed)
Hematology/Oncology Progress note [phone] [phone]      Patient Care Team: Carolynne Edouard as PCP - General (Physician Assistant) Edythe Clarity, [facility] as Pharmacist (Pharmacist) Earlie Server, MD as Consulting Physician (Hematology and Oncology)  REFERRING PROVIDER: Lavera Guise, MD  CHIEF COMPLAINTS/REASON FOR VISIT:  Follow-up for compound heterozygous hemochromatosis  PERTINENT HEMATOLOGY HISTORY  07/05/2020, patient had blood work done which showed decreased iron saturation 14, ferritin level was increased at 356, TIBC 315.  Patient was referred to hematology for further evaluation for possible hemochromatosis/elevated ferritin. Patient reports feeling well at baseline today. Patient drinks red wine 4-5 days per week.  Occasionally she also drinks beer when she orders pizza. Denies any recent infection, new medication. Denies any family history of hemochromatosis.   Cynthia Keith is a 76 y.o. female who has above history reviewed by me today presents for follow up visit for compound heterozygous hemochromatosis mutation Today patient reports feeling well. She noticed left calf pain and sensitivity in the evening.  She applies with rubbing deep blue essential oil which helps easing the discomfort.  Denies any lower extremity swelling. Otherwise she denies any new complaints.   Review of Systems  Constitutional:  Negative for appetite change, chills, fatigue and fever.  HENT:   Negative for hearing loss and voice change.   Eyes:  Negative for eye problems.  Respiratory:  Negative for chest tightness and cough.   Cardiovascular:  Negative for chest pain and leg swelling.  Gastrointestinal:  Negative for abdominal distention and blood in stool.  Endocrine: Negative for hot flashes.  Genitourinary:  Negative for difficulty urinating and frequency.   Musculoskeletal:  Negative for arthralgias.       Left calf  pain/sensitivity at night  Skin:  Negative for itching and rash.  Neurological:  Negative for extremity weakness.  Hematological:  Negative for adenopathy.  Psychiatric/Behavioral:  Negative for confusion.     MEDICAL HISTORY:  Past Medical History:  Diagnosis Date   Actinic keratosis 03/15/2021   L post thigh    Basal cell carcinoma 06/21/2021   L alar crease, EDC done 07/31/21   GERD (gastroesophageal reflux disease)    Hyperlipidemia    Hypertension    Plantar fasciitis    Sleep apnea    Urinary tract infection     SURGICAL HISTORY: Past Surgical History:  Procedure Laterality Date   ABDOMINAL HYSTERECTOMY     COLONOSCOPY WITH PROPOFOL N/A 12/12/2015   Procedure: COLONOSCOPY WITH PROPOFOL;  Surgeon: Manya Silvas, MD;  Location: [facility];  Service: Endoscopy;  Laterality: N/A;   HAND SURGERY  2005    SOCIAL HISTORY: Social History   Socioeconomic History   Marital status: Married    Spouse name: Not on file   Number of children: Not on file   Years of education: Not on file   Highest education level: Not on file  Occupational History   Not on file  Tobacco Use   Smoking status: Never   Smokeless tobacco: Never  Vaping Use   Vaping Use: Never used  Substance and Sexual Activity   Alcohol use: Yes    Comment: ocassionally   Drug use: No   Sexual activity: Yes    Birth control/protection: Post-menopausal, Surgical  Other Topics Concern   Not on file  Social History Narrative   Not on file   Social Determinants of Health   Financial Resource Strain: Not on file  Food Insecurity: Not  on file  Transportation Needs: Not on file  Physical Activity: Not on file  Stress: Not on file  Social Connections: Not on file  Intimate Partner Violence: Not on file    FAMILY HISTORY: Family History  Problem Relation Age of Onset   Breast cancer Cousin    Stroke Mother    Heart disease Paternal Grandmother    Cancer Paternal Grandfather    Stroke Sister     Stroke Brother     ALLERGIES:  is allergic to iodinated contrast media and iodine.  MEDICATIONS:  Current Outpatient Medications  Medication Sig Dispense Refill   amLODipine (NORVASC) 10 MG tablet TAKE 1 TABLET BY MOUTH EVERY DAY 90 tablet 1   aspirin EC 81 MG tablet Take 81 mg by mouth daily.     hydrochlorothiazide (HYDRODIURIL) 12.5 MG tablet TAKE 1 TABLET BY MOUTH EVERY DAY 90 tablet 3   Multiple Vitamin (MULTIVITAMIN) tablet Take 1 tablet by mouth daily.     omeprazole (PRILOSEC) 20 MG capsule TAKE 1 CAPSULE BY MOUTH EVERY DAY 90 capsule 1   rosuvastatin (CRESTOR) 5 MG tablet Take 1 tablet (5 mg total) by mouth daily. 90 tablet 3   clobetasol cream (TEMOVATE) 5.85 % Apply 1 application topically 2 (two) times daily. Apply twice daily to spot treat affected areas as needed for itch. Avoid applying to face, groin, and axilla. Use as directed. Long-term use can cause thinning of the skin. (Patient not taking: Reported on 07/24/2022) 30 g 1   No current facility-administered medications for this visit.     PHYSICAL EXAMINATION: ECOG PERFORMANCE STATUS: 0 - Asymptomatic Vitals:   07/24/22 1310  BP: 132/61  Pulse: 76  Resp: 18  Temp: 97.7 F (36.5 C)  SpO2: 98%   Filed Weights   07/24/22 1310  Weight: 145 lb 4.8 oz (65.9 kg)    Physical Exam Constitutional:      General: She is not in acute distress. HENT:     Head: Normocephalic and atraumatic.  Eyes:     General: No scleral icterus. Cardiovascular:     Rate and Rhythm: Normal rate and regular rhythm.     Heart sounds: Normal heart sounds.  Pulmonary:     Effort: Pulmonary effort is normal. No respiratory distress.     Breath sounds: No wheezing.  Abdominal:     General: Bowel sounds are normal. There is no distension.     Palpations: Abdomen is soft.  Musculoskeletal:        General: No deformity. Normal range of motion.     Cervical back: Normal range of motion and neck supple.  Skin:    General: Skin is  warm and dry.     Findings: No erythema or rash.  Neurological:     Mental Status: She is alert and oriented to person, place, and time. Mental status is at baseline.     Cranial Nerves: No cranial nerve deficit.     Coordination: Coordination normal.  Psychiatric:        Mood and Affect: Mood normal.     LABORATORY DATA:  I have reviewed the data as listed     Latest Ref Rng & Units 07/23/2022   11:18 AM 02/13/2022    6:33 AM 01/22/2022   10:13 AM  CBC  WBC 4.0 - 10.5 K/uL 7.9  7.8  8.8   Hemoglobin 12.0 - 15.0 g/dL 15.2  14.2  15.1   Hematocrit 36.0 - 46.0 % 43.6  41.7  43.0   Platelets 150 - 400 K/uL 234  254  253       Latest Ref Rng & Units 02/13/2022    6:33 AM 01/22/2022   10:13 AM 09/11/2021    9:59 AM  CMP  Glucose 70 - 99 mg/dL 118  105  97   BUN 8 - 23 mg/dL '18  18  13   '$ Creatinine 0.44 - 1.00 mg/dL 0.82  0.62  0.67   Sodium 135 - 145 mmol/L 138  134  140   Potassium 3.5 - 5.1 mmol/L 3.9  3.3  3.8   Chloride 98 - 111 mmol/L 101  98  101   CO2 22 - 32 mmol/L '28  26  25   '$ Calcium 8.9 - 10.3 mg/dL 9.8  10.1  10.6   Total Protein 6.5 - 8.1 g/dL 7.6  8.1  7.5   Total Bilirubin 0.3 - 1.2 mg/dL 0.6  0.6  0.4   Alkaline Phos 38 - 126 U/L 67  63  74   AST 15 - 41 U/L 34  34  36   ALT 0 - 44 U/L 35  28  26     Iron/TIBC/Ferritin/ %Sat    Component Value Date/Time   IRON 132 07/23/2022 1118   IRON 44 07/05/2020 1137   TIBC 389 07/23/2022 1118   TIBC 315 07/05/2020 1137   FERRITIN 94 07/23/2022 1118   FERRITIN 356 (H) 07/05/2020 1137   IRONPCTSAT 34 (H) 07/23/2022 1118   IRONPCTSAT 14 (L) 07/05/2020 1137      RADIOGRAPHIC STUDIES: I have personally reviewed the radiological images as listed and agreed with the findings in the report. No results found.    ASSESSMENT & PLAN:  1. Compound heterozygous hemochromatosis type 1 (HCC)   2. Pain of left calf   3. Erythrocytosis    #Compound heterozygous hemochromatosis mutation Patient carries 2 mutations  [id number] and [id number] Labs reviewed and discussed with patient. Iron labs remain stable, ferritin level 94, iron saturation slightly elevated at 34. Normal liver function.  No need for phlebotomy at this point for isolated increased iron saturation Continue expectant management. Avoid iron supplementation, alcohol use, vitamin C supplementation.  Mild intermittent erythrocytosis.  Likely secondary.  Recommend patient to have sleep study done for evaluation No need for phlebotomy for  Left calf pain/sensitivity, physical examination did not reveal any edema.?  Neuropathy versus other etiologies.  She declined ultrasound evaluation to rule out DVT.  Follow-up in 6 months.  Orders Placed This Encounter  Procedures   CBC with Differential/Platelet    Standing Status:   Future    Standing Expiration Date:   07/25/2023   Ferritin    Standing Status:   Future    Standing Expiration Date:   07/25/2023   Iron and TIBC    Standing Status:   Future    Standing Expiration Date:   07/25/2023    All questions were answered. The patient knows to call the clinic with any problems questions or concerns.  cc Lavera Guise, MD    Return of visit:  6 months-lab MD  Earlie Server, MD, PhD Hematology Oncology  07/24/2022

## 2022-07-24 NOTE — Progress Notes (Signed)
Per Dr. Tasia Catchings, no phlebotomy today.

## 2022-07-24 NOTE — Progress Notes (Signed)
Pt here for follow up. Pt reports she has been having Left lower leg pain for a few months.

## 2022-07-26 ENCOUNTER — Ambulatory Visit: Payer: Medicare HMO | Admitting: Oncology

## 2022-07-26 ENCOUNTER — Other Ambulatory Visit: Payer: Medicare HMO

## 2022-08-04 ENCOUNTER — Other Ambulatory Visit: Payer: Self-pay | Admitting: Physician Assistant

## 2022-08-04 DIAGNOSIS — I10 Essential (primary) hypertension: Secondary | ICD-10-CM

## 2022-08-06 ENCOUNTER — Ambulatory Visit (INDEPENDENT_AMBULATORY_CARE_PROVIDER_SITE_OTHER): Payer: Medicare HMO | Admitting: Physician Assistant

## 2022-08-06 ENCOUNTER — Encounter: Payer: Self-pay | Admitting: Physician Assistant

## 2022-08-06 ENCOUNTER — Telehealth: Payer: Self-pay

## 2022-08-06 VITALS — BP 140/72 | HR 72 | Temp 97.8°F | Resp 16 | Ht <= 58 in | Wt 144.0 lb

## 2022-08-06 DIAGNOSIS — G4710 Hypersomnia, unspecified: Secondary | ICD-10-CM | POA: Diagnosis not present

## 2022-08-06 DIAGNOSIS — E669 Obesity, unspecified: Secondary | ICD-10-CM | POA: Diagnosis not present

## 2022-08-06 DIAGNOSIS — R300 Dysuria: Secondary | ICD-10-CM

## 2022-08-06 DIAGNOSIS — I10 Essential (primary) hypertension: Secondary | ICD-10-CM

## 2022-08-06 LAB — POCT URINALYSIS DIPSTICK
Bilirubin, UA: NEGATIVE
Blood, UA: NEGATIVE
Glucose, UA: NEGATIVE
Ketones, UA: NEGATIVE
Leukocytes, UA: NEGATIVE
Nitrite, UA: NEGATIVE
Protein, UA: NEGATIVE
Spec Grav, UA: 1.01 (ref 1.010–1.025)
Urobilinogen, UA: 0.2 E.U./dL
pH, UA: 6 (ref 5.0–8.0)

## 2022-08-06 NOTE — Progress Notes (Signed)
[facility] [place], [place] [address]  Internal MEDICINE  Office Visit Note  Patient Name: Cynthia Keith  [id number]  [id number]  Date of Service: 08/06/2022  Chief Complaint  Patient presents with   Follow-up    Sleep study    Snoring   Urinary Tract Infection    Odor     HPI Pt is here for routine follow up to discuss sleep -She is told she snores at night and is aware of this as well. She also drools and sleeps with her mouth open. She is fatigued and tired during the daytime. If she sits down she will fall asleep, so she stays busy. -Still works T-[employer] all day  -Had a sleep study more than 10 years ago that was negative -BP borderline in office -Would like to check urine, has been doing cranberry juice and thinks this may have helped, she is followed by urology as well  EPWORTH SLEEPINESS SCALE:  Scale:  (0)= no chance of dozing; (1)= slight chance of dozing; (2)= moderate chance of dozing; (3)= high chance of dozing  Chance  Situtation    Sitting and reading: 0    Watching TV: 3    Sitting Inactive in public: 0    As a passenger in car: 0      Lying down to rest: 3    Sitting and talking: 0    Sitting quielty after lunch: 0    In a car, stopped in traffic: 0   TOTAL SCORE:   6 out of 24     Current Medication: Outpatient Encounter Medications as of 08/06/2022  Medication Sig   amLODipine (NORVASC) 10 MG tablet TAKE 1 TABLET BY MOUTH EVERY DAY   aspirin EC 81 MG tablet Take 81 mg by mouth daily.   hydrochlorothiazide (HYDRODIURIL) 12.5 MG tablet TAKE 1 TABLET BY MOUTH EVERY DAY   Multiple Vitamin (MULTIVITAMIN) tablet Take 1 tablet by mouth daily.   omeprazole (PRILOSEC) 20 MG capsule TAKE 1 CAPSULE BY MOUTH EVERY DAY   rosuvastatin (CRESTOR) 5 MG tablet Take 1 tablet (5 mg total) by mouth daily.   clobetasol cream (TEMOVATE) 4.48 % Apply 1 application topically 2 (two) times daily. Apply twice daily to spot treat affected  areas as needed for itch. Avoid applying to face, groin, and axilla. Use as directed. Long-term use can cause thinning of the skin. (Patient not taking: Reported on 08/06/2022)   No facility-administered encounter medications on file as of 08/06/2022.    Surgical History: Past Surgical History:  Procedure Laterality Date   ABDOMINAL HYSTERECTOMY     COLONOSCOPY WITH PROPOFOL N/A 12/12/2015   Procedure: COLONOSCOPY WITH PROPOFOL;  Surgeon: Manya Silvas, MD;  Location: [facility] ENDOSCOPY;  Service: Endoscopy;  Laterality: N/A;   HAND SURGERY  2005    Medical History: Past Medical History:  Diagnosis Date   Actinic keratosis 03/15/2021   L post thigh    Basal cell carcinoma 06/21/2021   L alar crease, EDC done 07/31/21   GERD (gastroesophageal reflux disease)    Hyperlipidemia    Hypertension    Plantar fasciitis    Sleep apnea    Urinary tract infection     Family History: Family History  Problem Relation Age of Onset   Breast cancer Cousin    Stroke Mother    Heart disease Paternal Grandmother    Cancer Paternal Grandfather    Stroke Sister    Stroke Brother  Social History   Socioeconomic History   Marital status: Married    Spouse name: Not on file   Number of children: Not on file   Years of education: Not on file   Highest education level: Not on file  Occupational History   Not on file  Tobacco Use   Smoking status: Never   Smokeless tobacco: Never  Vaping Use   Vaping Use: Never used  Substance and Sexual Activity   Alcohol use: Yes    Comment: ocassionally   Drug use: No   Sexual activity: Yes    Birth control/protection: Post-menopausal, Surgical  Other Topics Concern   Not on file  Social History Narrative   Not on file   Social Determinants of Health   Financial Resource Strain: Not on file  Food Insecurity: Not on file  Transportation Needs: Not on file  Physical Activity: Not on file  Stress: Not on file  Social Connections: Not on  file  Intimate Partner Violence: Not on file      Review of Systems  Constitutional:  Positive for fatigue. Negative for chills and unexpected weight change.  HENT:  Negative for congestion, postnasal drip, rhinorrhea, sneezing and sore throat.   Eyes:  Negative for redness.  Respiratory:  Negative for cough, chest tightness and shortness of breath.   Cardiovascular:  Negative for chest pain and palpitations.  Gastrointestinal:  Negative for abdominal pain, constipation, diarrhea, nausea and vomiting.  Genitourinary:  Negative for dysuria and frequency.  Musculoskeletal:  Negative for arthralgias, back pain, joint swelling and neck pain.  Skin:  Negative for rash.  Neurological: Negative.  Negative for tremors and numbness.  Hematological:  Negative for adenopathy. Does not bruise/bleed easily.  Psychiatric/Behavioral:  Positive for sleep disturbance. Negative for behavioral problems (Depression) and suicidal ideas. The patient is not nervous/anxious.     Vital Signs: BP (!) 140/72   Pulse 72   Temp 97.8 F (36.6 C)   Resp 16   Ht '4\' 8"'$  (1.422 m)   Wt 144 lb (65.3 kg)   SpO2 95%   BMI 32.28 kg/m    Physical Exam Vitals and nursing note reviewed.  Constitutional:      General: She is not in acute distress.    Appearance: She is well-developed. She is obese. She is not diaphoretic.  HENT:     Head: Normocephalic and atraumatic.     Mouth/Throat:     Pharynx: No oropharyngeal exudate.  Eyes:     Pupils: Pupils are equal, round, and reactive to light.  Neck:     Thyroid: No thyromegaly.     Vascular: No JVD.     Trachea: No tracheal deviation.  Cardiovascular:     Rate and Rhythm: Normal rate and regular rhythm.     Heart sounds: Normal heart sounds. No murmur heard.    No friction rub. No gallop.  Pulmonary:     Effort: Pulmonary effort is normal. No respiratory distress.     Breath sounds: No wheezing or rales.  Chest:     Chest wall: No tenderness.  Abdominal:      General: Bowel sounds are normal.     Palpations: Abdomen is soft.  Musculoskeletal:        General: Normal range of motion.     Cervical back: Normal range of motion and neck supple.  Lymphadenopathy:     Cervical: No cervical adenopathy.  Skin:    General: Skin is warm and dry.  Neurological:  Mental Status: She is alert and oriented to person, place, and time.     Cranial Nerves: No cranial nerve deficit.  Psychiatric:        Behavior: Behavior normal.        Thought Content: Thought content normal.        Judgment: Judgment normal.        Assessment/Plan: 1. Hypersomnia Due to snoring, daytime fatigue, elevated BMI and HTN, will order PSG - PSG SLEEP STUDY; Future  2. Essential hypertension Borderline elevated in office, continue current medication  3. Obesity (BMI 30.0-34.9) Obesity Counseling: Had a lengthy discussion regarding patients BMI and weight issues. Patient was instructed on portion control as well as increased activity. Also discussed caloric restrictions with trying to maintain intake less than 2000 Kcal. Discussions were made in accordance with the 5As of weight management. Simple actions such as not eating late and if able to, taking a walk is suggested.  4. Dysuria - POCT Urinalysis Dipstick Neg   General Counseling: Savanah verbalizes understanding of the findings of todays visit and agrees with plan of treatment. I have discussed any further diagnostic evaluation that may be needed or ordered today. We also reviewed her medications today. she has been encouraged to call the office with any questions or concerns that should arise related to todays visit.    Orders Placed This Encounter  Procedures   POCT Urinalysis Dipstick   PSG SLEEP STUDY    No orders of the defined types were placed in this encounter.   This patient was seen by Drema [place], PA-C in collaboration with Dr. Clayborn Bigness as a part of collaborative care  agreement.   Total time spent:30 Minutes Time spent includes review of chart, medications, test results, and follow up plan with the patient.      Dr Lavera Guise Internal medicine

## 2022-08-06 NOTE — Telephone Encounter (Signed)
SS order placed in [facility] folder-Toni 

## 2022-08-08 ENCOUNTER — Encounter (INDEPENDENT_AMBULATORY_CARE_PROVIDER_SITE_OTHER): Payer: Medicare HMO | Admitting: Internal Medicine

## 2022-08-08 ENCOUNTER — Telehealth: Payer: Self-pay

## 2022-08-08 DIAGNOSIS — G4719 Other hypersomnia: Secondary | ICD-10-CM | POA: Diagnosis not present

## 2022-08-08 NOTE — Telephone Encounter (Signed)
Patient scheduled for PSG on 08/08/22 @ [facility].tat

## 2022-08-15 ENCOUNTER — Telehealth: Payer: Self-pay

## 2022-08-15 NOTE — Telephone Encounter (Signed)
Received SS results. Scanned. Per [facility], cpap titration will be scheduled asap-Toni

## 2022-08-15 NOTE — Procedures (Signed)
[place] Report Part I                                                                 Phone: [phone] Fax: [phone]  Patient Name: Cynthia Keith, Cynthia Keith. Acquisition Number: [id number]  Date of Birth: June 24, 1946 Acquisition Date: 08/08/2022  Referring Physician: Drema Dallas PA-C     History: The patient is a 76 year old female who was referred for evaluation of possible sleep apnea with snoring and sleepiness. Medical History: actinic keratosis, basal cell carcinoma, GERD, hyperlipidemia, hypertension, plantar fasciitis, OSA, UTI.  Medications: amlodipine, aspirin, hydrochlorothiazide, multi-vitamin, omeprazole, rosuvastatin, clobetasol cream.  Procedure: This routine overnight polysomnogram was performed on the Alice 5 using the standard diagnostic protocol. This included 6 channels of EEG, 2 channels of EOG, chin EMG, bilateral anterior tibialis EMG, nasal/oral thermistor, PTAF (nasal pressure transducer), chest and abdominal wall movements, EKG, and pulse oximetry.  Description: The total recording time was 424.0 minutes. The total sleep time was 370.0 minutes. There were a total of 35.5 minutes of wakefulness after sleep onset for a reducedsleep efficiency of 87.3%. The latency to sleep onset was within normal limitsat 18.5 minutes. The R sleep onset latency was within normal limits at 101.0 minutes. Sleep parameters, as a percentage of the total sleep time, demonstrated 5.4% of sleep was in N1 sleep, 77.8% N2, 0.0% N3 and 16.8% R sleep. There were a total of 27 arousals for an arousal index of 4.4 arousals per hour of sleep that was normal.  Respiratory monitoring demonstrated nearly continuous mild to moderate degree of snoring. Supine sleep was not observed. There were 51 apneas and hypopneas for an Apnea Hypopnea Index of 8.3 apneas and hypopneas per hour of sleep. The REM related apnea hypopnea index was 8.7/hr of REM sleep compared to a NREM AHI of  8.0/hr. The Respiratory Disturbance Index, which includes 1 respiratory effort related arousals (RERAs), was 8.4 respiratory events per hour of sleep.  The average duration of the respiratory events was 27.3 seconds with a maximum duration of 62.0 seconds. The respiratory events were associated with peripheral oxygen desaturations on the average to 90%. The lowest oxygen desaturation associated with a respiratory event was 85%. Additionally, the baseline oxygen saturation during wakefulness was 95%, during NREM sleep averaged 94%, and during REM sleep averaged  94%. The total duration of oxygen < 90% was 1.5 minutes.  Cardiac monitoring- did not demonstrate transient cardiac decelerations associated with the apneas. There were no significant cardiac rhythm irregularities.   Periodic limb movement monitoring- demonstrated that there were 178 periodic limb movements for a periodic limb movement index of 28.9 periodic limb movements per hour of sleep. Quasi-periodic limb movements were observed during periods of wakefulness.  Impression: This routine overnight polysomnogram demonstrated significant obstructive sleep apnea with an overall Apnea Hypopnea Index of 8.3 apneas and hypopneas per hour of sleep with the lowest desaturation to 85%.  Supine sleep was not observed which may have caused an underestimation of the severity of the sleep apnea.  There was a significantly elevated periodic limb movement index of 28.9 periodic limb movements per hour of sleep. In addition, quasi-periodic limb movements were observed during periods of wakefulness. The patient reports symptoms suggestive of restless  leg syndrome. Sometimes these limb movements subside once the apnea is controlled.  There was a reduced sleep efficiency with a reduced REM percentage.  These findings would appear to be due to the combination of obstructive sleep apnea and periodic limb movements.   Recommendations:    A CPAP titration would be  recommended for the sleep apnea.  Would recommend weight loss in a patient with a BMI of 31.4.  Alternative treatment options may include an oral appliance, a nasal resistance device,  or ENT surgery in the appropriate clinical context.     Allyne Gee, MD, [facility] Diplomate ABMS-Pulmonary, Critical Care and Sleep Medicine  Electronically reviewed and digitally signed  [place] Report Part II  Phone: [phone] Fax: [phone]  Patient last name [name] Neck Size 15.0   in. Acquisition [phone]  Patient first name Cynthia Keith. Weight 140.0 lbs. Started 08/08/2022 at 10:07:51 PM  Birth date Dec 13, 1946 Height 56.0 in. Stopped 08/09/2022 at 5:33:57 AM  Age 48 BMI 31.4 lb/in2 Duration 424.0  Study Type Adult      [place], Cynthia Keith  Richelle Ito. Henke, PhD, ABSM, FAASM Sleep Data: Lights Out: 10:26:51 PM Sleep Onset: 10:45:21 PM  Lights On: 5:30:51 AM Sleep Efficiency: 87.3 %  Total Recording Time: 424.0 min Sleep Latency (from Lights Off) 18.5 min  Total Sleep Time (TST): 370.0 min R Latency (from Sleep Onset): 101.0 min  Sleep Period Time: 405.0 min Total number of awakenings: 10  Wake during sleep: 35.0 min Wake After Sleep Onset (WASO): 35.5 min   Sleep Data:         Arousal Summary: Stage  Latency from lights out (min) Latency from sleep onset (min) Duration (min) % Total Sleep Time  Normal values  N 1 18.5 0.0 20.0 5.4 (5%)  N 2 19.0 0.5 288.0 77.8 (50%)  N 3       0.0 0.0 (20%)  R 119.5 101.0 62.0 16.8 (25%)    Number Index  Spontaneous 19 3.1  Apneas & Hypopneas 1 0.2  RERAs 1 0.2       (Apneas & Hypopneas & RERAs)  (2) (0.3)  Limb Movement 7 1.1  Snore 0 0.0  TOTAL 28 4.5     Respiratory Data:  CA OA MA Apnea Hypopnea* A+ H RERA Total  Number 3 0 0 3 48 51 1 52  Mean Dur (sec) 13.0 0.0 0.0 13.0 28.2 27.3 24.5 27.3  Max Dur (sec) 14.5 0.0 0.0 14.5 62.0 62.0 24.5 62.0  Total Dur (min) 0.7 0.0 0.0 0.7 22.6 23.2 0.4 23.6   % of TST 0.2 0.0 0.0 0.2 6.1 6.3 0.1 6.4  Index (#/h TST) 0.5 0.0 0.0 0.5 7.8 8.3 0.2 8.4  *Hypopneas scored based on 4% or greater desaturation.  Sleep Stage:        REM NREM TST  AHI 8.7 8.0 8.3  RDI 8.7 8.2 8.4           Body Position Data:  Sleep (min) TST (%) REM (min) NREM (min) CA (#) OA (#) MA (#) HYP (#) AHI (#/h) RERA (#) RDI (#/h) Desat (#)  Supine    0.00                      0 0.00     Non-Supine 370.00 100.00 62.00 308.00 3.00 0.00 0.00 48.00 8.27 1.00 8.43 59.00  Right: 370.0 100.00 62.0 308.0 3 0 0 48  8.3 1 8.4 59     Snoring: Total number of snoring episodes  0  Total time with snoring    min (   % of sleep)   Oximetry Distribution:             WK REM NREM TOTAL  Average (%)   95 94 94 94  < 90% 0.0 0.1 1.4 1.5  < 80% 0.0 0.0 0.0 0.0  < 70% 0.0 0.0 0.0 0.0  # of Desaturations* 2 9 48 59  Desat Index (#/hour) 3.0 8.7 9.4 9.6  Desat Max (%) '5 9 12 12  '$ Desat Max Dur (sec) 23.0 50.0 109.0 109.0  Approx Min O2 during sleep 85  Approx min O2 during a respiratory event 85  Was Oxygen added (Y/N) and final rate No:   0 LPM  *Desaturations based on 4% or greater drop from baseline.   Cheyne Stokes Breathing: None Present  Heart Rate Summary:  Average Heart Rate During Sleep 59.3 bpm      Highest Heart Rate During Sleep (95th %) 64.0 bpm      Highest Heart Rate During Sleep 140 bpm      Highest Heart Rate During Recording (TIB) 168 bpm (artifact)   Heart Rate Observations: Event Type # Events   Bradycardia 0 Lowest HR Scored: N/A  Sinus Tachycardia During Sleep 0 Highest HR Scored: N/A  Narrow Complex Tachycardia 0 Highest HR Scored: N/A  Wide Complex Tachycardia 0 Highest HR Scored: N/A  Asystole 0 Longest Pause: N/A  Atrial Fibrillation 0 Duration Longest Event: N/A  Other Arrythmias  No Type:    Periodic Limb Movement Data: (Primary legs unless otherwise noted) Total # Limb Movement 186 Limb Movement Index 30.2  Total # PLMS 178  PLMS Index 28.9  Total # PLMS Arousals 7 PLMS Arousal Index 1.1  Percentage Sleep Time with PLMS 72.68mn (19.5 % sleep)  Mean Duration limb movements (secs) 228.0

## 2022-08-23 ENCOUNTER — Encounter (INDEPENDENT_AMBULATORY_CARE_PROVIDER_SITE_OTHER): Payer: Medicare HMO | Admitting: Internal Medicine

## 2022-08-23 DIAGNOSIS — G4733 Obstructive sleep apnea (adult) (pediatric): Secondary | ICD-10-CM | POA: Diagnosis not present

## 2022-08-30 ENCOUNTER — Telehealth: Payer: Self-pay

## 2022-08-30 NOTE — Telephone Encounter (Signed)
Pt called that she had diarrhea this morning only 1 time after she ate advised her to eat bland diet  and if she is not feeling better call us back

## 2022-08-30 NOTE — Procedures (Signed)
[place] Report Part I  Phone: [phone] Fax: [phone]  Patient Name: Cynthia Keith, Cynthia Keith. Acquisition Number: [id number]  Date of Birth: 1946/01/02 Acquisition Date: 08/23/2022  Referring Physician: Drema Dallas PA-C     History: The patient is a 76 year old female with obstructive sleep apnea for CPAP titration. Medical History: actinic keratosis, basal cell carcinoma, GERD, hyperlipidemia, hypertension, plantar fasciitis, OSA, UTI.  Medications: amlodipine, aspirin, hydrochlorothiazide, multivitamin, omeprazole, rosuvastatin, clobetasol cream.  Procedure: This routine overnight polysomnogram was performed on the Alice 5 using the standard CPAP protocol. This included 6 channels of EEG, 2 channels of EOG, chin EMG, bilateral anterior tibialis EMG, nasal/oral thermistor, PTAF (nasal pressure transducer), chest and abdominal wall movements, EKG, and pulse oximetry.  Description: The total recording time was 397.5 minutes. The total sleep time was 326.5 minutes. There were a total of 60.5 minutes of wakefulness after sleep onset for a reducedsleep efficiency of 82.1%. The latency to sleep onset was within normal limits at 10.5 minutes. The R sleep onset latency was within normal limits at 64.5 minutes. Sleep parameters, as a percentage of the total sleep time, demonstrated 4.6% of sleep was in N1 sleep, 83.5% N2, 7.2% N3 and 4.7% R sleep. There were a total of 34 arousals for an arousal index of 6.2 arousals per hour of sleep that was normal.  Overall, there were a total of 37 respiratory events for a respiratory disturbance index, which includes apneas, hypopneas and RERAs (increased respiratory effort) of 6.8 respiratory events per hour of sleep during the pressure titration. CPAP was initiated at 4 cm H2O at lights out, 10:47 p.m. It was titrated in 1-2 cm increments for intermittent hypopneas to 10 cm H2O. The apnea was controlled and supine, REM sleep  was observed. The pressure was further titrated to the final pressure of.14 cm  H2O. Central apneas emerged at these higher pressures.  Additionally, the baseline oxygen saturation during wakefulness was 95%, during NREM sleep averaged 96%, and during REM sleep averaged 95%. The total duration of oxygen < 90% was 1.2 minutes.  Cardiac monitoring-  There were no significant cardiac rhythm irregularities.   Periodic limb movement monitoring- demonstrated that there were 160 periodic limb movements for a periodic limb movement index of 29.4 periodic limb movements per hour of sleep. Quasi-periodic limb movements were observed during periods of wakefulness.  Impression: This patient's obstructive sleep apnea demonstrated significant improvement with the utilization of nasal CPAP at 10 cm H2O.   There was a significantly elevated periodic limb movement index of 29.4 periodic limb movements per hour of sleep. In addition, quasi-periodic limb movements were observed during periods of wakefulness. These limb movements were also observed during the prior PSG. Treatment may be indicated if sleep disruption or sleepiness persist once the patient is fully compliant with CPAP.  Recommendations: Would recommend utilization of nasal CPAP at 10 cm H2O.      An AirFit N20 mask, size Medium, was used. Chin strap used during study- No. Humidifier used during study- Yes.     Allyne Gee, MD, [facility] Diplomate ABMS-Pulmonary, Critical Care and Sleep Medicine  Electronically reviewed and digitally signed  [place] CPAP/BIPAP Polysomnogram Report Part II Phone: [phone] Fax: [phone]  Patient last name [place] Neck Size 15.0 in. Acquisition [phone]  Patient first name Cynthia Keith. Weight 140.0 lbs. Started 08/23/2022 at 10:39:04 PM  Birth date Sep 20, 1946 Height 56.0 in. Stopped 08/24/2022 at 5:27:10  AM  Age 55      Type Adult BMI 31.4 lb/in2 Duration 397.5  Rod Holler - RPSGT, Margaretmary Eddy  Reviewed by: Richelle Ito. Henke, PhD, ABSM, FAASM Sleep Data: Lights Out: 10:47:04 PM Sleep Onset: 10:57:34 PM  Lights On: 5:24:34 AM Sleep Efficiency: 82.1 %  Total Recording Time: 397.5 min Sleep Latency (from Lights Off) 10.5 min  Total Sleep Time (TST): 326.5 min R Latency (from Sleep Onset): 64.5 min  Sleep Period Time: 386.5 min Total number of awakenings: 10  Wake during sleep: 60.0 min Wake After Sleep Onset (WASO): 60.5 min   Sleep Data:         Arousal Summary: Stage  Latency from lights out (min) Latency from sleep onset (min) Duration (min) % Total Sleep Time  Normal values  N 1 10.5 0.0 15.0 4.6 (5%)  N 2 11.0 0.5 272.5 83.5 (50%)  N 3 22.5 12.0 23.5 7.2 (20%)  R 75.0 64.5 15.5 4.7 (25%)    Number Index  Spontaneous 26 4.8  Apneas & Hypopneas 1 0.2  RERAs 1 0.2       (Apneas & Hypopneas & RERAs)  (2) (0.4)  Limb Movement 7 1.3  Snore 0 0.0  TOTAL 35 6.4     Respiratory Data:  CA OA MA Apnea Hypopnea* A+ H RERA Total  Number 8 1 0 9 27 36 1 37  Mean Dur (sec) 19.2 20.5 0.0 19.3 33.3 29.8 18.0 29.5  Max Dur (sec) 31.5 20.5 0.0 31.5 75.5 75.5 18.0 75.5  Total Dur (min) 2.6 0.3 0.0 2.9 15.0 17.9 0.3 18.2  % of TST 0.8 0.1 0.0 0.9 4.6 5.5 0.1 5.6  Index (#/h TST) 1.5 0.2 0.0 1.7 5.0 6.6 0.2 6.8  *Hypopneas scored based on 4% or greater desaturation.  Sleep Stage:         REM NREM TST  AHI 19.4 6.0 6.6  RDI 19.4 6.2 6.8    Sleep (min) TST (%) REM (min) NREM (min) CA (#) OA (#) MA (#) HYP (#) AHI (#/h) RERA (#) RDI (#/h) Desat (#)  Supine 259.5 79.48 11.5 248.0 8 1 0 27 8.3 1 8.6 44  Non-Supine 67.00 20.52 4.00 63.00 0.00 0.00 0.00 0.00 0.00 0 0.00 4.00  Left: 67.0 20.52 4.0 63.0 0 0 0 0 0.0 0 0.00 4     Snoring: Total number of snoring episodes  0  Total time with snoring    min (   % of sleep)   Oximetry Distribution:             WK REM NREM TOTAL  Average (%)   95 95 96 96  < 90% 0.4 0.0 0.8 1.2  < 80% 0.3 0.0 0.0 0.3  < 70% 0.3  0.0 0.0 0.3  # of Desaturations* 2 3 43 48  Desat Index (#/hour) 2.9 11.6 8.3 8.8  Desat Max (%) '4 7 16 16  '$ Desat Max Dur (sec) 17.0 68.0 80.0 80.0  Approx Min O2 during sleep 80  Approx min O2 during a respiratory event 80  Was Oxygen added (Y/N) and final rate No:   0 LPM  *Desaturations based on 3% or greater drop from baseline.   Cheyne Stokes Breathing: None Present    Heart Rate Summary:  Average Heart Rate During Sleep 52.6 bpm      Highest Heart Rate During Sleep (95th %) 59.0 bpm      Highest Heart Rate During Sleep 109 bpm  Highest Heart Rate During Recording (TIB) 151 bpm (artifact)   Heart Rate Observations: Event Type # Events   Bradycardia 0 Lowest HR Scored: N/A  Sinus Tachycardia During Sleep 0 Highest HR Scored: N/A  Narrow Complex Tachycardia 0 Highest HR Scored: N/A  Wide Complex Tachycardia 0 Highest HR Scored: N/A  Asystole 0 Longest Pause: N/A  Atrial Fibrillation 0 Duration Longest Event: N/A  Other Arrythmias  No Type:   Periodic Limb Movement Data: (Primary legs unless otherwise noted) Total # Limb Movement 167 Limb Movement Index 30.7  Total # PLMS 160 PLMS Index 29.4  Total # PLMS Arousals 7 PLMS Arousal Index 1.3  Percentage Sleep Time with PLMS 75.39mn (23.1 % sleep)  Mean Duration limb movements (secs) 301.6    IPAP Level (cmH2O) EPAP Level (cmH2O) Total Duration (min) Sleep Duration (min) Sleep (%) REM (%) CA  #) OA # MA # HYP #) AHI (#/hr) RERAs # RERAs (#/hr) RDI (#/hr)  4 4 4.8 4.8 100.0 0.0 0 0 0 2 25.0 0 0.0 25.0  5 5 29.5 29.5 100.0 0.0 0 0 0 0 0.0 0 0.0 0.0  6 6 21.7 19.5 89.9 0.0 0 1 0 6 21.5 0 0.0 21.'5  7 7 '$ 11.0 11.0 100.0 0.0 0 0 0 6 32.7 0 0.0 32.'7  8 8 '$ 10.2 10.2 100.0 25.5 0 0 0 4 23.5 0 0.0 23.'5  9 9 '$ 19.5 19.5 100.0 39.0 1 0 0 7 24.6 0 0.0 24.'6  10 10 '$ 80.0 74.7 93.4 1.3 0 0 0 2 1.6 0 0.0 1.'6  11 11 '$ 17.9 17.9 100.0 0.0 0 0 0 0 0.0 0 0.0 0.0  12 12 92.2 82.2 89.2 4.3 0 0 0 0 0.0 0 0.0 0.0  13 13 15.2 12.7 83.6 0.0 0 0 0 0  0.0 0 0.0 0.0  14 14 34.0 32.0 94.1 0.0 4 0 0 0 7.5 0 0.0 7.'5  15 15 '$ 15.7 7.9 50.3 0.0 2 0 0 0 15.2 1 7.6 22.8

## 2022-09-03 NOTE — Telephone Encounter (Signed)
error 

## 2022-09-07 ENCOUNTER — Other Ambulatory Visit: Payer: Self-pay | Admitting: Physician Assistant

## 2022-09-07 DIAGNOSIS — E782 Mixed hyperlipidemia: Secondary | ICD-10-CM

## 2022-09-13 ENCOUNTER — Ambulatory Visit (INDEPENDENT_AMBULATORY_CARE_PROVIDER_SITE_OTHER): Payer: Medicare HMO | Admitting: Nurse Practitioner

## 2022-09-13 ENCOUNTER — Encounter: Payer: Self-pay | Admitting: Nurse Practitioner

## 2022-09-13 VITALS — BP 159/83 | Temp 98.2°F | Resp 16 | Ht <= 58 in | Wt 148.0 lb

## 2022-09-13 DIAGNOSIS — R7301 Impaired fasting glucose: Secondary | ICD-10-CM

## 2022-09-13 DIAGNOSIS — R42 Dizziness and giddiness: Secondary | ICD-10-CM | POA: Diagnosis not present

## 2022-09-13 LAB — POCT GLYCOSYLATED HEMOGLOBIN (HGB A1C): Hemoglobin A1C: 5.3 % (ref 4.0–5.6)

## 2022-09-13 NOTE — Progress Notes (Signed)
[facility] [address] [place], [place] [address]  Internal MEDICINE  Office Visit Note  Patient Name: Cynthia Keith  [id number]  [id number]  Date of Service: 09/13/2022  Chief Complaint  Patient presents with   Acute Visit    Dizziness   Gastroesophageal Reflux   Hyperlipidemia   Hypertension    HPI Cynthia Keith presents for a follow up visit for unsteady/off-balance feeling and dizziness, no vertigo --also has had a headache --denies any nausea, vomiting, decreased hearing, ear pain, or vertigo --orthostatic Bps were normal --was told by a friend she might have "inner ear" --sleep apnea -- 8.3 ahi, Qualified but declined cpap.  --Resolving dermatitis on left leg    Current Medication: Outpatient Encounter Medications as of 09/13/2022  Medication Sig   amLODipine (NORVASC) 10 MG tablet TAKE 1 TABLET BY MOUTH EVERY DAY   aspirin EC 81 MG tablet Take 81 mg by mouth daily.   omeprazole (PRILOSEC) 20 MG capsule TAKE 1 CAPSULE BY MOUTH EVERY DAY   rosuvastatin (CRESTOR) 5 MG tablet TAKE 1 TABLET (5 MG TOTAL) BY MOUTH DAILY.   [DISCONTINUED] clobetasol cream (TEMOVATE) 0.05 % Apply 1 application topically 2 (two) times daily. Apply twice daily to spot treat affected areas as needed for itch. Avoid applying to face, groin, and axilla. Use as directed. Long-term use can cause thinning of the skin. (Patient not taking: Reported on 10/19/2022)   [DISCONTINUED] hydrochlorothiazide (HYDRODIURIL) 12.5 MG tablet TAKE 1 TABLET BY MOUTH EVERY DAY (Patient not taking: Reported on 10/19/2022)   [DISCONTINUED] Multiple Vitamin (MULTIVITAMIN) tablet Take 1 tablet by mouth daily. (Patient not taking: Reported on 10/19/2022)   No facility-administered encounter medications on file as of 09/13/2022.    Surgical History: Past Surgical History:  Procedure Laterality Date   ABDOMINAL HYSTERECTOMY     COLONOSCOPY WITH PROPOFOL N/A 12/12/2015   Procedure: COLONOSCOPY WITH PROPOFOL;   Surgeon: Scot Jun, MD;  Location[facility] ENDOSCOPY;  Service: Endoscopy;  Laterality: N/A;   HAND SURGERY  2005    Medical History: Past Medical History:  Diagnosis Date   Actinic keratosis 03/15/2021   L post thigh    Basal cell carcinoma 06/21/2021   L alar crease, EDC done 07/31/21   GERD (gastroesophageal reflux disease)    Hyperlipidemia    Hypertension    Malignant essential hypertension 02/26/2017   Plantar fasciitis    Sleep apnea    Urinary tract infection     Family History: Family History  Problem Relation Age of Onset   Breast cancer Cousin    Stroke Mother    Heart disease Paternal Grandmother    Cancer Paternal Grandfather    Stroke Sister    Stroke Brother     Social History   Socioeconomic History   Marital status: Married    Spouse name: Not on file   Number of children: Not on file   Years of education: Not on file   Highest education level: Not on file  Occupational History   Not on file  Tobacco Use   Smoking status: Never   Smokeless tobacco: Never  Vaping Use   Vaping Use: Never used  Substance and Sexual Activity   Alcohol use: Yes    Comment: ocassionally   Drug use: No   Sexual activity: Yes    Birth control/protection: Post-menopausal, Surgical  Other Topics Concern   Not on file  Social History Narrative   Not on file   Social Determinants of Health  Financial Resource Strain: Not on file  Food Insecurity: No Food Insecurity (10/20/2022)   Hunger Vital Sign    Worried About Running Out of Food in the Last Year: Never true    Ran Out of Food in the Last Year: Never true  Transportation Needs: No Transportation Needs (10/20/2022)   PRAPARE - Administrator, Civil Service (Medical): No    Lack of Transportation (Non-Medical): No  Physical Activity: Not on file  Stress: Not on file  Social Connections: Not on file  Intimate Partner Violence: Not At Risk (10/20/2022)   Humiliation, Afraid, Rape, and Kick  questionnaire    Fear of Current or Ex-Partner: No    Emotionally Abused: No    Physically Abused: No    Sexually Abused: No      Review of Systems  Constitutional:  Negative for appetite change, chills, fatigue and fever.  HENT:  Negative for congestion, ear discharge, ear pain, hearing loss and tinnitus.   Respiratory: Negative.  Negative for cough, chest tightness, shortness of breath and wheezing.   Cardiovascular: Negative.  Negative for chest pain and palpitations.  Gastrointestinal: Negative.  Negative for diarrhea, nausea and vomiting.  Endocrine: Negative for polydipsia, polyphagia and polyuria.  Musculoskeletal: Negative.   Neurological:  Positive for dizziness and headaches. Negative for syncope, weakness and numbness.    Vital Signs: BP (!) 159/83 (BP Location: Right Arm, Patient Position: Supine)   Temp 98.2 F (36.8 C)   Resp 16   Ht 4\' 8"  (1.422 m)   Wt 148 lb (67.1 kg)   SpO2 99%   BMI 33.18 kg/m    Physical Exam Vitals reviewed.  Constitutional:      Appearance: Normal appearance.  HENT:     Head: Normocephalic and atraumatic.  Eyes:     Pupils: Pupils are equal, round, and reactive to light.  Cardiovascular:     Rate and Rhythm: Normal rate and regular rhythm.  Pulmonary:     Effort: Pulmonary effort is normal. No respiratory distress.  Neurological:     Mental Status: She is alert and oriented to person, place, and time.  Psychiatric:        Mood and Affect: Mood normal.        Behavior: Behavior normal.        Assessment/Plan: 1. Dizziness, nonspecific Orthostatic BP are normal, it comes and goes. Consider ENT referral if the dizziness continues.   2. Impaired fasting glucose A1c is normal - POCT glycosylated hemoglobin (Hb A1C)   General Counseling: Deangelo verbalizes understanding of the findings of todays visit and agrees with plan of treatment. I have discussed any further diagnostic evaluation that may be needed or ordered today.  We also reviewed her medications today. she has been encouraged to call the office with any questions or concerns that should arise related to todays visit.    Orders Placed This Encounter  Procedures   POCT glycosylated hemoglobin (Hb A1C)    No orders of the defined types were placed in this encounter.   Return if symptoms worsen or fail to improve, for and otherwise as previously scheduled routine visits..   Total time spent:30 Minutes Time spent includes review of chart, medications, test results, and follow up plan with the patient.   [place] Controlled Substance Database was reviewed by me.  This patient was seen by Sallyanne Kuster, FNP-C in collaboration with Dr. Beverely Risen as a part of collaborative care agreement.   Darlene Bartelt R. Tedd Sias, MSN,  FNP-C Internal medicine

## 2022-09-17 ENCOUNTER — Ambulatory Visit: Payer: Medicare HMO | Admitting: Physician Assistant

## 2022-09-17 DIAGNOSIS — H1045 Other chronic allergic conjunctivitis: Secondary | ICD-10-CM | POA: Diagnosis not present

## 2022-09-17 DIAGNOSIS — H18593 Other hereditary corneal dystrophies, bilateral: Secondary | ICD-10-CM | POA: Diagnosis not present

## 2022-09-24 ENCOUNTER — Encounter: Payer: Self-pay | Admitting: Physician Assistant

## 2022-09-24 ENCOUNTER — Ambulatory Visit (INDEPENDENT_AMBULATORY_CARE_PROVIDER_SITE_OTHER): Payer: Medicare HMO | Admitting: Physician Assistant

## 2022-09-24 VITALS — BP 158/80 | HR 60 | Temp 98.2°F | Resp 16 | Ht <= 58 in | Wt 145.0 lb

## 2022-09-24 DIAGNOSIS — R300 Dysuria: Secondary | ICD-10-CM | POA: Diagnosis not present

## 2022-09-24 DIAGNOSIS — I10 Essential (primary) hypertension: Secondary | ICD-10-CM

## 2022-09-24 DIAGNOSIS — E782 Mixed hyperlipidemia: Secondary | ICD-10-CM | POA: Diagnosis not present

## 2022-09-24 DIAGNOSIS — Z0001 Encounter for general adult medical examination with abnormal findings: Secondary | ICD-10-CM

## 2022-09-24 MED ORDER — TETANUS-DIPHTH-ACELL PERTUSSIS 5-2.5-18.5 LF-MCG/0.5 IM SUSP: 0.5000 mL | Freq: Once | INTRAMUSCULAR | 0 refills | Status: AC

## 2022-09-24 MED ORDER — ZOSTER VAC RECOMB ADJUVANTED 50 MCG/0.5ML IM SUSR: 0.5000 mL | Freq: Once | INTRAMUSCULAR | 0 refills | Status: AC

## 2022-09-24 NOTE — Progress Notes (Signed)
[facility] [place], [place] [address]  Internal MEDICINE  Office Visit Note  Patient Name: Cynthia Keith  [id number]  [id number]  Date of Service: 09/24/2022  Chief Complaint  Patient presents with   Medicare Wellness   Hyperlipidemia   Hypertension     HPI Pt is here for routine health maintenance examination -Everything has been going well and she has no new concerns today -Due for tdap as well as flu shot. Per our record she did have 1 dose of shingrix in 2022, but second dose not listed therefore she is going to double check with pharmacy and let us know. Will have all done at the pharmacy. -Continues to stay very busy and works regularly -has not had labs done yet -164/78 on recheck, doesn't check regularly. -will take 2 of hctz, and will start checking bp regularly, Continue amlodipine as before  Current Medication: Outpatient Encounter Medications as of 09/24/2022  Medication Sig   amLODipine (NORVASC) 10 MG tablet TAKE 1 TABLET BY MOUTH EVERY DAY   aspirin EC 81 MG tablet Take 81 mg by mouth daily.   clobetasol cream (TEMOVATE) 5.32 % Apply 1 application topically 2 (two) times daily. Apply twice daily to spot treat affected areas as needed for itch. Avoid applying to face, groin, and axilla. Use as directed. Long-term use can cause thinning of the skin.   hydrochlorothiazide (HYDRODIURIL) 12.5 MG tablet TAKE 1 TABLET BY MOUTH EVERY DAY   Multiple Vitamin (MULTIVITAMIN) tablet Take 1 tablet by mouth daily.   omeprazole (PRILOSEC) 20 MG capsule TAKE 1 CAPSULE BY MOUTH EVERY DAY   rosuvastatin (CRESTOR) 5 MG tablet TAKE 1 TABLET (5 MG TOTAL) BY MOUTH DAILY.   [DISCONTINUED] Tdap (BOOSTRIX) 5-2.5-18.5 LF-MCG/0.5 injection Inject 0.5 mLs into the muscle once.   [DISCONTINUED] Zoster Vaccine Adjuvanted [facility]) injection Inject 0.5 mLs into the muscle once.   Tdap (BOOSTRIX) 5-2.5-18.5 LF-MCG/0.5 injection Inject 0.5 mLs into the muscle once for  1 dose.   Zoster Vaccine Adjuvanted [facility]) injection Inject 0.5 mLs into the muscle once for 1 dose.   No facility-administered encounter medications on file as of 09/24/2022.    Surgical History: Past Surgical History:  Procedure Laterality Date   ABDOMINAL HYSTERECTOMY     COLONOSCOPY WITH PROPOFOL N/A 12/12/2015   Procedure: COLONOSCOPY WITH PROPOFOL;  Surgeon: Manya Silvas, MD;  Location: [facility] ENDOSCOPY;  Service: Endoscopy;  Laterality: N/A;   HAND SURGERY  2005    Medical History: Past Medical History:  Diagnosis Date   Actinic keratosis 03/15/2021   L post thigh    Basal cell carcinoma 06/21/2021   L alar crease, EDC done 07/31/21   GERD (gastroesophageal reflux disease)    Hyperlipidemia    Hypertension    Plantar fasciitis    Sleep apnea    Urinary tract infection     Family History: Family History  Problem Relation Age of Onset   Breast cancer Cousin    Stroke Mother    Heart disease Paternal Grandmother    Cancer Paternal Grandfather    Stroke Sister    Stroke Brother       Review of Systems  Constitutional:  Negative for chills, fatigue and unexpected weight change.  HENT:  Negative for congestion, postnasal drip, rhinorrhea, sneezing and sore throat.   Eyes:  Negative for redness.  Respiratory:  Negative for cough, chest tightness and shortness of breath.   Cardiovascular:  Negative for chest pain and palpitations.  Gastrointestinal:  Negative for abdominal pain, constipation, diarrhea, nausea and vomiting.  Genitourinary:  Negative for dysuria and frequency.  Musculoskeletal:  Negative for arthralgias, back pain, joint swelling and neck pain.  Skin:  Negative for rash.  Neurological: Negative.  Negative for tremors and numbness.  Hematological:  Negative for adenopathy. Does not bruise/bleed easily.  Psychiatric/Behavioral:  Negative for behavioral problems (Depression), sleep disturbance and suicidal ideas.      Vital Signs: BP (!)  158/80   Pulse 60   Temp 98.2 F (36.8 C)   Resp 16   Ht '4\' 8"'$  (1.422 m)   Wt 145 lb (65.8 kg)   SpO2 97%   BMI 32.51 kg/m    Physical Exam Vitals and nursing note reviewed.  Constitutional:      General: She is not in acute distress.    Appearance: Normal appearance. She is well-developed. She is obese. She is not diaphoretic.  HENT:     Head: Normocephalic and atraumatic.     Mouth/Throat:     Pharynx: No oropharyngeal exudate.  Eyes:     Pupils: Pupils are equal, round, and reactive to light.  Neck:     Thyroid: No thyromegaly.     Vascular: No JVD.     Trachea: No tracheal deviation.  Cardiovascular:     Rate and Rhythm: Normal rate and regular rhythm.     Heart sounds: Normal heart sounds. No murmur heard.    No friction rub. No gallop.  Pulmonary:     Effort: Pulmonary effort is normal. No respiratory distress.     Breath sounds: No wheezing or rales.  Chest:     Chest wall: No tenderness.  Abdominal:     General: Bowel sounds are normal.     Palpations: Abdomen is soft.     Tenderness: There is no abdominal tenderness.  Musculoskeletal:        General: Normal range of motion.     Cervical back: Normal range of motion and neck supple.  Lymphadenopathy:     Cervical: No cervical adenopathy.  Skin:    General: Skin is warm and dry.  Neurological:     Mental Status: She is alert and oriented to person, place, and time.     Cranial Nerves: No cranial nerve deficit.  Psychiatric:        Behavior: Behavior normal.        Thought Content: Thought content normal.        Judgment: Judgment normal.      LABS: Recent Results (from the past 2160 hour(s))  Iron and TIBC([facility])     Status: Abnormal   Collection Time: 07/23/22 11:18 AM  Result Value Ref Range   Iron 132 28 - 170 ug/dL   TIBC 389 250 - 450 ug/dL   Saturation Ratios 34 (H) 10.4 - 31.8 %   UIBC 257 ug/dL    Comment: Performed at [facility], [address]  [place], [place] [address]  Ferritin     Status: None   Collection Time: 07/23/22 11:18 AM  Result Value Ref Range   Ferritin 94 11 - 307 ng/mL    Comment: Performed at [facility], [address] [place], [place] [address]  CBC with Differential/Platelet     Status: Abnormal   Collection Time: 07/23/22 11:18 AM  Result Value Ref Range   WBC 7.9 4.0 - 10.5 K/uL   RBC 4.80 3.87 - 5.11 MIL/uL   Hemoglobin 15.2 (H) 12.0 - 15.0 g/dL  HCT 43.6 36.0 - 46.0 %   MCV 90.8 80.0 - 100.0 fL   MCH 31.7 26.0 - 34.0 pg   MCHC 34.9 30.0 - 36.0 g/dL   RDW 12.7 11.5 - 15.5 %   Platelets 234 150 - 400 K/uL   nRBC 0.0 0.0 - 0.2 %   Neutrophils Relative % 50 %   Neutro Abs 4.0 1.7 - 7.7 K/uL   Lymphocytes Relative 32 %   Lymphs Abs 2.5 0.7 - 4.0 K/uL   Monocytes Relative 12 %   Monocytes Absolute 1.0 0.1 - 1.0 K/uL   Eosinophils Relative 5 %   Eosinophils Absolute 0.4 0.0 - 0.5 K/uL   Basophils Relative 1 %   Basophils Absolute 0.1 0.0 - 0.1 K/uL   Immature Granulocytes 0 %   Abs Immature Granulocytes 0.03 0.00 - 0.07 K/uL    Comment: Performed at [facility], [address] [place], [place] [address]  POCT Urinalysis Dipstick     Status: None   Collection Time: 08/06/22 10:34 AM  Result Value Ref Range   Color, UA     Clarity, UA     Glucose, UA Negative Negative   Bilirubin, UA Negative    Ketones, UA Negative    Spec Grav, UA 1.010 1.010 - 1.025   Blood, UA Negative    pH, UA 6.0 5.0 - 8.0   Protein, UA Negative Negative   Urobilinogen, UA 0.2 0.2 or 1.0 E.U./dL   Nitrite, UA Negative    Leukocytes, UA Negative Negative   Appearance     Odor    POCT glycosylated hemoglobin (Hb A1C)     Status: None   Collection Time: 09/13/22  3:40 PM  Result Value Ref Range   Hemoglobin A1C 5.3 4.0 - 5.6 %   HbA1c POC (<> result, manual entry)     HbA1c, POC (prediabetic range)     HbA1c, POC (controlled diabetic range)          Assessment/Plan: 1. Encounter for general  adult medical examination with abnormal findings Cpe performed, labs previously ordered and will need to have done, UTD on PHM  2. Essential hypertension Elevated in office, advised to take 2 of her HCTZ ('25mg'$  total) and continue amlodipine as before. Will monitor at home and recheck in 4 weeks  3. Mixed hyperlipidemia Continue crestor  4. Dysuria - UA/M w/rflx Culture, Routine   General Counseling: syeda prickett understanding of the findings of todays visit and agrees with plan of treatment. I have discussed any further diagnostic evaluation that may be needed or ordered today. We also reviewed her medications today. she has been encouraged to call the office with any questions or concerns that should arise related to todays visit.    Counseling:    Orders Placed This Encounter  Procedures   UA/M w/rflx Culture, Routine    Meds ordered this encounter  Medications   Tdap (BOOSTRIX) 5-2.5-18.5 LF-MCG/0.5 injection    Sig: Inject 0.5 mLs into the muscle once for 1 dose.    Dispense:  0.5 mL    Refill:  0   Zoster Vaccine Adjuvanted [facility]) injection    Sig: Inject 0.5 mLs into the muscle once for 1 dose.    Dispense:  0.5 mL    Refill:  0    This patient was seen by Drema Dallas, PA-C in collaboration with Dr. Clayborn Bigness as a part of collaborative care agreement.  Total time spent:35 Minutes  Time spent includes  review of chart, medications, test results, and follow up plan with the patient.     Lavera Guise, MD  Internal Medicine

## 2022-09-27 LAB — URINE CULTURE, REFLEX

## 2022-09-27 LAB — UA/M W/RFLX CULTURE, ROUTINE
Bilirubin, UA: NEGATIVE
Glucose, UA: NEGATIVE
Ketones, UA: NEGATIVE
Nitrite, UA: NEGATIVE
Protein,UA: NEGATIVE
RBC, UA: NEGATIVE
Specific Gravity, UA: 1.009 (ref 1.005–1.030)
Urobilinogen, Ur: 0.2 mg/dL (ref 0.2–1.0)
pH, UA: 7.5 (ref 5.0–7.5)

## 2022-09-27 LAB — MICROSCOPIC EXAMINATION
Bacteria, UA: NONE SEEN
Casts: NONE SEEN /lpf
RBC, Urine: NONE SEEN /hpf (ref 0–2)
WBC, UA: NONE SEEN /hpf (ref 0–5)

## 2022-10-01 ENCOUNTER — Ambulatory Visit: Payer: Medicare HMO | Admitting: Dermatology

## 2022-10-01 ENCOUNTER — Telehealth: Payer: Self-pay

## 2022-10-01 ENCOUNTER — Other Ambulatory Visit: Payer: Self-pay

## 2022-10-01 DIAGNOSIS — L300 Nummular dermatitis: Secondary | ICD-10-CM | POA: Diagnosis not present

## 2022-10-01 DIAGNOSIS — Z85828 Personal history of other malignant neoplasm of skin: Secondary | ICD-10-CM

## 2022-10-01 DIAGNOSIS — L820 Inflamed seborrheic keratosis: Secondary | ICD-10-CM | POA: Diagnosis not present

## 2022-10-01 DIAGNOSIS — L578 Other skin changes due to chronic exposure to nonionizing radiation: Secondary | ICD-10-CM | POA: Diagnosis not present

## 2022-10-01 DIAGNOSIS — D225 Melanocytic nevi of trunk: Secondary | ICD-10-CM | POA: Diagnosis not present

## 2022-10-01 DIAGNOSIS — L821 Other seborrheic keratosis: Secondary | ICD-10-CM | POA: Diagnosis not present

## 2022-10-01 DIAGNOSIS — D229 Melanocytic nevi, unspecified: Secondary | ICD-10-CM

## 2022-10-01 MED ORDER — SULFAMETHOXAZOLE-TRIMETHOPRIM 800-160 MG PO TABS: 1.0000 | ORAL_TABLET | Freq: Two times a day (BID) | ORAL | 0 refills | Status: DC

## 2022-10-01 NOTE — Telephone Encounter (Signed)
-----   Message from Mylinda Latina, PA-C sent at 10/01/2022  1:20 PM EDT ----- Please send bactrim BID x 7days as she has signs of UTI again

## 2022-10-01 NOTE — Progress Notes (Signed)
Follow-Up Visit   Subjective  Cynthia Keith is a 76 y.o. female who presents for the following: Follow-up.  Patient presents for 6 month follow-up. She has a history of BCC of the left alar crease treated 07/2021. She has a history of breaking out on her left lower leg, improved with clobetasol/CeraVe mix. She also has a spot on her right thigh she would like checked which is irritated. Also a mole on her buttock she would like checked, not bothersome.   The following portions of the chart were reviewed this encounter and updated as appropriate:       Review of Systems:  No other skin or systemic complaints except as noted in HPI or Assessment and Plan.  Objective  Well appearing patient in no apparent distress; mood and affect are within normal limits.  A focused examination was performed including face, chest, arms, legs. Relevant physical exam findings are noted in the Assessment and Plan.  R upper lateral calf, R lat thigh Erythematous stuck-on, waxy papule  Left Lower Leg Few scattered pink scaly papules  L medial buttock 6m flesh papule with darker rim  Left alar crease Well healed scar with no evidence of recurrence.     Assessment & Plan  Actinic Damage - chronic, secondary to cumulative UV radiation exposure/sun exposure over time - diffuse scaly erythematous macules with underlying dyspigmentation - Recommend daily broad spectrum sunscreen SPF 30+ to sun-exposed areas, reapply every 2 hours as needed.  - Recommend staying in the shade or wearing long sleeves, sun glasses (UVA+UVB protection) and wide brim hats (4-inch brim around the entire circumference of the hat). - Call for new or changing lesions.  History of Basal Cell Carcinoma of the Skin - No evidence of recurrence today, left alar crease - Recommend regular full body skin exams - Recommend daily broad spectrum sunscreen SPF 30+ to sun-exposed areas, reapply every 2 hours as needed.  - Call if any  new or changing lesions are noted between office visits  Melanocytic Nevi - Tan-brown and/or pink-flesh-colored symmetric macules and papules - Benign appearing on exam today - Observation - Call clinic for new or changing moles - Recommend daily use of broad spectrum spf 30+ sunscreen to sun-exposed areas.   Seborrheic Keratoses - Stuck-on, waxy, tan-brown papules and/or plaques  - Benign-appearing - Discussed benign etiology and prognosis. - Observe - Call for any changes  Inflamed seborrheic keratosis R upper lateral calf, R lat thigh  Symptomatic, irritating, patient would like treated.  Destruction of lesion - R upper lateral calf, R lat thigh  Destruction method: cryotherapy   Informed consent: discussed and consent obtained   Lesion destroyed using liquid nitrogen: Yes   Region frozen until ice ball extended beyond lesion: Yes   Outcome: patient tolerated procedure well with no complications   Post-procedure details: wound care instructions given   Additional details:  Prior to procedure, discussed risks of blister formation, small wound, skin dyspigmentation, or rare scar following cryotherapy. Recommend Vaseline ointment to treated areas while healing.   Nummular dermatitis Left Lower Leg  Mild flare  Recommend mild soap and moisturizing cream 1-2 times daily.  Gentle skin care handout provided.   Continue Clobetasol/CeraVe mix Apply to affected areas rash QD/BID until improved. Patient has at home.   Topical steroids (such as triamcinolone, fluocinolone, fluocinonide, mometasone, clobetasol, halobetasol, betamethasone, hydrocortisone) can cause thinning and lightening of the skin if they are used for too long in the same area. Your physician  has selected the right strength medicine for your problem and area affected on the body. Please use your medication only as directed by your physician to prevent side effects.    Nevus L medial buttock  Benign-appearing.   Observation.  Call clinic for new or changing moles.  Recommend daily use of broad spectrum spf 30+ sunscreen to sun-exposed areas.   Discussed removal if becomes bothersome.   History of basal cell carcinoma (BCC) Left alar crease  Clear. Observe for recurrence. Call clinic for new or changing lesions.  Recommend regular skin exams, daily broad-spectrum spf 30+ sunscreen use, and photoprotection.     Return in about 6 months (around 04/02/2023) for TBSE, Hx BCC.  IJamesetta Orleans, CMA, am acting as scribe for Brendolyn Patty, MD .  Documentation: I have reviewed the above documentation for accuracy and completeness, and I agree with the above.  Brendolyn Patty MD

## 2022-10-01 NOTE — Telephone Encounter (Signed)
Pt advised that that urine did showed UTI send bactrim

## 2022-10-01 NOTE — Patient Instructions (Signed)
Due to recent changes in healthcare laws, you may see results of your pathology and/or laboratory studies on MyChart before the doctors have had a chance to review them. We understand that in some cases there may be results that are confusing or concerning to you. Please understand that not all results are received at the same time and often the doctors may need to interpret multiple results in order to provide you with the best plan of care or course of treatment. Therefore, we ask that you please give us 2 business days to thoroughly review all your results before contacting the office for clarification. Should we see a critical lab result, you will be contacted sooner.   If You Need Anything After Your Visit  If you have any questions or concerns for your doctor, please call our main line at [phone] and press option 4 to reach your doctor's medical assistant. If no one answers, please leave a voicemail as directed and we will return your call as soon as possible. Messages left after 4 pm will be answered the following business day.   You may also send us a message via MyChart. We typically respond to MyChart messages within 1-2 business days.  For prescription refills, please ask your pharmacy to contact our office. Our fax number is [phone].  If you have an urgent issue when the clinic is closed that cannot wait until the next business day, you can page your doctor at the number below.    Please note that while we do our best to be available for urgent issues outside of office hours, we are not available 24/7.   If you have an urgent issue and are unable to reach us, you may choose to seek medical care at your doctor's office, retail clinic, urgent care center, or emergency room.  If you have a medical emergency, please immediately call 911 or go to the emergency department.  Pager Numbers  - Dr. Kowalski: [phone]  - Dr. Moye: [phone]  - Dr. Stewart:  [phone]  In the event of inclement weather, please call our main line at [phone] for an update on the status of any delays or closures.  Dermatology Medication Tips: Please keep the boxes that topical medications come in in order to help keep track of the instructions about where and how to use these. Pharmacies typically print the medication instructions only on the boxes and not directly on the medication tubes.   If your medication is too expensive, please contact our office at [phone] option 4 or send us a message through MyChart.   We are unable to tell what your co-pay for medications will be in advance as this is different depending on your insurance coverage. However, we may be able to find a substitute medication at lower cost or fill out paperwork to get insurance to cover a needed medication.   If a prior authorization is required to get your medication covered by your insurance company, please allow us 1-2 business days to complete this process.  Drug prices often vary depending on where the prescription is filled and some pharmacies may offer cheaper prices.  The website www.goodrx.com contains coupons for medications through different pharmacies. The prices here do not account for what the cost may be with help from insurance (it may be cheaper with your insurance), but the website can give you the price if you did not use any insurance.  - You can print the associated coupon and take it with   your prescription to the pharmacy.  - You may also stop by our office during regular business hours and pick up a GoodRx coupon card.  - If you need your prescription sent electronically to a different pharmacy, notify our office through [place] MyChart or by phone at [phone] option 4.     Si Usted Necesita Algo Despus de Su Visita  Tambin puede enviarnos un mensaje a travs de MyChart. Por lo general respondemos a los mensajes de MyChart en el transcurso de 1 a 2  das hbiles.  Para renovar recetas, por favor pida a su farmacia que se ponga en contacto con nuestra oficina. Nuestro nmero de fax es el [phone].  Si tiene un asunto urgente cuando la clnica est cerrada y que no puede esperar hasta el siguiente da hbil, puede llamar/localizar a su doctor(a) al nmero que aparece a continuacin.   Por favor, tenga en cuenta que aunque hacemos todo lo posible para estar disponibles para asuntos urgentes fuera del horario de oficina, no estamos disponibles las 24 horas del da, los 7 das de la semana.   Si tiene un problema urgente y no puede comunicarse con nosotros, puede optar por buscar atencin mdica  en el consultorio de su doctor(a), en una clnica privada, en un centro de atencin urgente o en una sala de emergencias.  Si tiene una emergencia mdica, por favor llame inmediatamente al 911 o vaya a la sala de emergencias.  Nmeros de bper  - Dr. Kowalski: [phone]  - Dra. Moye: [phone]  - Dra. Stewart: [phone]  En caso de inclemencias del tiempo, por favor llame a nuestra lnea principal al [phone] para una actualizacin sobre el estado de cualquier retraso o cierre.  Consejos para la medicacin en dermatologa: Por favor, guarde las cajas en las que vienen los medicamentos de uso tpico para ayudarle a seguir las instrucciones sobre dnde y cmo usarlos. Las farmacias generalmente imprimen las instrucciones del medicamento slo en las cajas y no directamente en los tubos del medicamento.   Si su medicamento es muy caro, por favor, pngase en contacto con nuestra oficina llamando al [phone] y presione la opcin 4 o envenos un mensaje a travs de MyChart.   No podemos decirle cul ser su copago por los medicamentos por adelantado ya que esto es diferente dependiendo de la cobertura de su seguro. Sin embargo, es posible que podamos encontrar un medicamento sustituto a menor costo o llenar un formulario para que el  seguro cubra el medicamento que se considera necesario.   Si se requiere una autorizacin previa para que su compaa de seguros cubra su medicamento, por favor permtanos de 1 a 2 das hbiles para completar este proceso.  Los precios de los medicamentos varan con frecuencia dependiendo del lugar de dnde se surte la receta y alguna farmacias pueden ofrecer precios ms baratos.  El sitio web www.goodrx.com tiene cupones para medicamentos de diferentes farmacias. Los precios aqu no tienen en cuenta lo que podra costar con la ayuda del seguro (puede ser ms barato con su seguro), pero el sitio web puede darle el precio si no utiliz ningn seguro.  - Puede imprimir el cupn correspondiente y llevarlo con su receta a la farmacia.  - Tambin puede pasar por nuestra oficina durante el horario de atencin regular y recoger una tarjeta de cupones de GoodRx.  - Si necesita que su receta se enve electrnicamente a una farmacia diferente, informe a nuestra oficina a travs de MyChart de [place]   o por telfono llamando al [phone] y presione la opcin 4.  

## 2022-10-19 ENCOUNTER — Emergency Department: Payer: Medicare HMO

## 2022-10-19 ENCOUNTER — Other Ambulatory Visit: Payer: Self-pay

## 2022-10-19 ENCOUNTER — Observation Stay
Admission: EM | Admit: 2022-10-19 | Discharge: 2022-10-20 | Disposition: A | Discharge status: Home / Self Care | Payer: Medicare HMO | Attending: Student | Admitting: Student

## 2022-10-19 DIAGNOSIS — R0602 Shortness of breath: Secondary | ICD-10-CM | POA: Diagnosis not present

## 2022-10-19 DIAGNOSIS — R7989 Other specified abnormal findings of blood chemistry: Secondary | ICD-10-CM | POA: Diagnosis not present

## 2022-10-19 DIAGNOSIS — M549 Dorsalgia, unspecified: Secondary | ICD-10-CM | POA: Diagnosis not present

## 2022-10-19 DIAGNOSIS — R0789 Other chest pain: Principal | ICD-10-CM | POA: Insufficient documentation

## 2022-10-19 DIAGNOSIS — R9431 Abnormal electrocardiogram [ECG] [EKG]: Secondary | ICD-10-CM | POA: Diagnosis not present

## 2022-10-19 DIAGNOSIS — I10 Essential (primary) hypertension: Secondary | ICD-10-CM | POA: Diagnosis present

## 2022-10-19 DIAGNOSIS — Z85828 Personal history of other malignant neoplasm of skin: Secondary | ICD-10-CM | POA: Diagnosis not present

## 2022-10-19 DIAGNOSIS — Z7982 Long term (current) use of aspirin: Secondary | ICD-10-CM | POA: Insufficient documentation

## 2022-10-19 DIAGNOSIS — R079 Chest pain, unspecified: Secondary | ICD-10-CM | POA: Diagnosis not present

## 2022-10-19 DIAGNOSIS — Z79899 Other long term (current) drug therapy: Secondary | ICD-10-CM | POA: Insufficient documentation

## 2022-10-19 DIAGNOSIS — K219 Gastro-esophageal reflux disease without esophagitis: Secondary | ICD-10-CM | POA: Diagnosis present

## 2022-10-19 DIAGNOSIS — R739 Hyperglycemia, unspecified: Secondary | ICD-10-CM | POA: Diagnosis not present

## 2022-10-19 LAB — CBC
HCT: 42.4 % (ref 36.0–46.0)
Hemoglobin: 14.7 g/dL (ref 12.0–15.0)
MCH: 31.8 pg (ref 26.0–34.0)
MCHC: 34.7 g/dL (ref 30.0–36.0)
MCV: 91.8 fL (ref 80.0–100.0)
Platelets: 251 10*3/uL (ref 150–400)
RBC: 4.62 MIL/uL (ref 3.87–5.11)
RDW: 12.8 % (ref 11.5–15.5)
WBC: 10.7 10*3/uL — ABNORMAL HIGH (ref 4.0–10.5)
nRBC: 0 % (ref 0.0–0.2)

## 2022-10-19 LAB — TROPONIN I (HIGH SENSITIVITY)
Troponin I (High Sensitivity): 4 ng/L (ref <18)
Troponin I (High Sensitivity): 6 ng/L (ref <18)

## 2022-10-19 LAB — D-DIMER, QUANTITATIVE: D-Dimer, Quant: 0.97 ug/mL-FEU — ABNORMAL HIGH (ref 0.00–0.50)

## 2022-10-19 LAB — BASIC METABOLIC PANEL
Anion gap: 10 (ref 5–15)
BUN: 14 mg/dL (ref 8–23)
CO2: 24 mmol/L (ref 22–32)
Calcium: 10.1 mg/dL (ref 8.9–10.3)
Chloride: 104 mmol/L (ref 98–111)
Creatinine, Ser: 0.63 mg/dL (ref 0.44–1.00)
GFR, Estimated: >60 mL/min (ref >60)
Glucose, Bld: 100 mg/dL — ABNORMAL HIGH (ref 70–99)
Potassium: 3.4 mmol/L — ABNORMAL LOW (ref 3.5–5.1)
Sodium: 138 mmol/L (ref 135–145)

## 2022-10-19 MED ORDER — DIPHENHYDRAMINE HCL 25 MG PO CAPS: 50.0000 mg | ORAL_CAPSULE | Freq: Once | ORAL | Status: DC

## 2022-10-19 MED ORDER — ENOXAPARIN SODIUM 80 MG/0.8ML IJ SOSY
1.0000 mg/kg | PREFILLED_SYRINGE | Freq: Once | INTRAMUSCULAR | Status: AC
  Administered 2022-10-20: 65 mg via SUBCUTANEOUS
  Filled 2022-10-19: qty 0.65

## 2022-10-19 MED ORDER — ENOXAPARIN SODIUM 80 MG/0.8ML IJ SOSY
1.0000 mg/kg | PREFILLED_SYRINGE | Freq: Once | INTRAMUSCULAR | Status: AC
  Administered 2022-10-19: 65 mg via SUBCUTANEOUS
  Filled 2022-10-19: qty 0.65

## 2022-10-19 MED ORDER — METHYLPREDNISOLONE SODIUM SUCC 40 MG IJ SOLR: 40.0000 mg | Freq: Once | INTRAMUSCULAR | Status: DC

## 2022-10-19 MED ORDER — DIPHENHYDRAMINE HCL 50 MG/ML IJ SOLN: 50.0000 mg | Freq: Once | INTRAMUSCULAR | Status: DC

## 2022-10-19 MED ORDER — MORPHINE SULFATE (PF) 2 MG/ML IV SOLN: 2.0000 mg | INTRAVENOUS | Status: DC | PRN

## 2022-10-19 MED ORDER — ACETAMINOPHEN 325 MG PO TABS: 650.0000 mg | ORAL_TABLET | Freq: Four times a day (QID) | ORAL | Status: DC | PRN

## 2022-10-19 MED ORDER — ASPIRIN 81 MG PO TBEC
81.0000 mg | DELAYED_RELEASE_TABLET | Freq: Every day | ORAL | Status: DC
  Administered 2022-10-20: 81 mg via ORAL
  Filled 2022-10-19: qty 1

## 2022-10-19 MED ORDER — ROSUVASTATIN CALCIUM 10 MG PO TABS
5.0000 mg | ORAL_TABLET | Freq: Every day | ORAL | Status: DC
  Administered 2022-10-20: 5 mg via ORAL
  Filled 2022-10-19: qty 1

## 2022-10-19 MED ORDER — SODIUM CHLORIDE 0.9 % IV SOLN: INTRAVENOUS | Status: DC

## 2022-10-19 MED ORDER — FENTANYL CITRATE PF 50 MCG/ML IJ SOSY: 50.0000 ug | PREFILLED_SYRINGE | INTRAMUSCULAR | Status: DC | PRN

## 2022-10-19 MED ORDER — LIDOCAINE 5 % EX PTCH
1.0000 | MEDICATED_PATCH | CUTANEOUS | Status: DC
  Administered 2022-10-19: 1 via TRANSDERMAL
  Filled 2022-10-19: qty 1

## 2022-10-19 MED ORDER — SODIUM CHLORIDE 0.9% FLUSH
3.0000 mL | Freq: Two times a day (BID) | INTRAVENOUS | Status: DC
  Administered 2022-10-19: 3 mL via INTRAVENOUS

## 2022-10-19 MED ORDER — ACETAMINOPHEN 650 MG RE SUPP: 650.0000 mg | Freq: Four times a day (QID) | RECTAL | Status: DC | PRN

## 2022-10-19 NOTE — Discharge Instructions (Addendum)
Return immediately for new or worsening pain.  Return to ED in the AM for imaging given elevated d-dimer.

## 2022-10-19 NOTE — Assessment & Plan Note (Signed)
PT at moderate risk for heart disease. We will obtain cardiology consult for eval for chest pain.  Along with eval for PE. EKG shows SR 64 with incomplete RBBB.

## 2022-10-19 NOTE — ED Triage Notes (Signed)
Patient arrived by EMS from [place]. Reports 9am back pain that radiates into chest. Reports 8/10 pain with movement.   EMS vitals: 180/80 b/p 72HR 99% RA 125CBG  UC gave 4 baby aspirin

## 2022-10-19 NOTE — Assessment & Plan Note (Signed)
IV ppi.   

## 2022-10-19 NOTE — H&P (Signed)
History and Physical    Chief Complaint: Left sided back pain   HISTORY OF PRESENT ILLNESS: Cynthia Keith is an 76 y.o. female   left sided back pain radiating to left shoulder and neck. Started while at work at [employer]. It got worse with  working, and friends told her to get to ed. Pt is a hair stylist. No NSAID use, no tobacco , Pt has heart score of  5 putting her at moderate risk for ischemic heart disease. Pain is worse with movement.     Pt has PMH as below: Past Medical History:  Diagnosis Date   Actinic keratosis 03/15/2021   L post thigh    Basal cell carcinoma 06/21/2021   L alar crease, EDC done 07/31/21   GERD (gastroesophageal reflux disease)    Hyperlipidemia    Hypertension    Malignant essential hypertension 02/26/2017   Plantar fasciitis    Sleep apnea    Urinary tract infection     Review of Systems  Respiratory:  Positive for shortness of breath.   Cardiovascular:  Positive for leg swelling. Negative for palpitations.  Musculoskeletal:  Positive for back pain.  All other systems reviewed and are negative.    Allergies  Allergen Reactions   Iodinated Contrast Media Anaphylaxis    Remote hx of respiratory arrest after IV iodine for kidney study. Immediately treated w/o sequela. No previous or subsequent IVP exposure. Remote hx of respiratory arrest after IV iodine for kidney study. Immediately treated w/o sequela. No previous or subsequent IVP exposure. Remote hx of respiratory arrest after IV iodine for kidney study. Immediately treated w/o sequela. No previous or subsequent IVP exposure.   Iodine Shortness Of Breath    "Quit Breathing"     Past Surgical History:  Procedure Laterality Date   ABDOMINAL HYSTERECTOMY     COLONOSCOPY WITH PROPOFOL N/A 12/12/2015   Procedure: COLONOSCOPY WITH PROPOFOL;  Surgeon: Manya Silvas, MD;  Location: [facility] ENDOSCOPY;  Service: Endoscopy;  Laterality: N/A;   HAND SURGERY  2005      Social History    Socioeconomic History   Marital status: Married    Spouse name: Not on file   Number of children: Not on file   Years of education: Not on file   Highest education level: Not on file  Occupational History   Not on file  Tobacco Use   Smoking status: Never   Smokeless tobacco: Never  Vaping Use   Vaping Use: Never used  Substance and Sexual Activity   Alcohol use: Yes    Comment: ocassionally   Drug use: No   Sexual activity: Yes    Birth control/protection: Post-menopausal, Surgical  Other Topics Concern   Not on file  Social History Narrative   Not on file   Social Determinants of Health   Financial Resource Strain: Not on file  Food Insecurity: Not on file  Transportation Needs: Not on file  Physical Activity: Not on file  Stress: Not on file  Social Connections: Not on file      CURRENT MEDS:    Current Facility-Administered Medications (Cardiovascular):    nitroGLYCERIN (NITROSTAT) SL tablet 0.4 mg   rosuvastatin (CRESTOR) tablet 5 mg  Current Outpatient Medications (Cardiovascular):    amLODipine (NORVASC) 10 MG tablet, TAKE 1 TABLET BY MOUTH EVERY DAY   rosuvastatin (CRESTOR) 5 MG tablet, TAKE 1 TABLET (5 MG TOTAL) BY MOUTH DAILY.   hydrochlorothiazide (HYDRODIURIL) 12.5 MG tablet, TAKE 1 TABLET BY MOUTH  EVERY DAY (Patient not taking: Reported on 10/19/2022)    Current Facility-Administered Medications (Analgesics):    acetaminophen (TYLENOL) tablet 650 mg **OR** acetaminophen (TYLENOL) suppository 650 mg   aspirin EC tablet 81 mg   fentaNYL (SUBLIMAZE) injection 50 mcg   morphine (PF) 2 MG/ML injection 2 mg  Current Outpatient Medications (Analgesics):    aspirin EC 81 MG tablet, Take 81 mg by mouth daily.  Current Facility-Administered Medications (Hematological):    enoxaparin (LOVENOX) injection 65 mg   Current Facility-Administered Medications (Other):    0.9 %  sodium chloride infusion   lidocaine (LIDODERM) 5 % 1 patch   sodium  chloride flush (NS) 0.9 % injection 3 mL  Current Outpatient Medications (Other):    omeprazole (PRILOSEC) 20 MG capsule, TAKE 1 CAPSULE BY MOUTH EVERY DAY   clobetasol cream (TEMOVATE) 9.76 %, Apply 1 application topically 2 (two) times daily. Apply twice daily to spot treat affected areas as needed for itch. Avoid applying to face, groin, and axilla. Use as directed. Long-term use can cause thinning of the skin. (Patient not taking: Reported on 10/19/2022)   Multiple Vitamin (MULTIVITAMIN) tablet, Take 1 tablet by mouth daily. (Patient not taking: Reported on 10/19/2022)   sulfamethoxazole-trimethoprim (BACTRIM DS) 800-160 MG tablet, Take 1 tablet by mouth 2 (two) times daily. (Patient not taking: Reported on 10/19/2022)    ED Course: Pt in Ed pt is A/O and BP is high.  Vitals:   10/20/22 0100 10/20/22 0130 10/20/22 0146 10/20/22 0230  BP: 136/66 133/62  (!) 138/54  Pulse: 60 (!) 56  (!) 56  Resp: '18 16  18  '$ Temp:   97.8 F (36.6 C)   TempSrc:   Oral   SpO2: 96% 97%  97%  Weight:       No intake/output data recorded. SpO2: 97 % Blood work in ed shows: low potassium, wbc count of 10.7 , Results for orders placed or performed during the hospital encounter of 10/19/22 (from the past 48 hour(s))  Basic metabolic panel     Status: Abnormal   Collection Time: 10/19/22  2:13 PM  Result Value Ref Range   Sodium 138 135 - 145 mmol/L   Potassium 3.4 (L) 3.5 - 5.1 mmol/L   Chloride 104 98 - 111 mmol/L   CO2 24 22 - 32 mmol/L   Glucose, Bld 100 (H) 70 - 99 mg/dL    Comment: Glucose reference range applies only to samples taken after fasting for at least 8 hours.   BUN 14 8 - 23 mg/dL   Creatinine, Ser 0.63 0.44 - 1.00 mg/dL   Calcium 10.1 8.9 - 10.3 mg/dL   GFR, Estimated >60 >60 mL/min    Comment: (NOTE) Calculated using the CKD-EPI Creatinine Equation (2021)    Anion gap 10 5 - 15    Comment: Performed at [facility], [address] [place], [place] [address]  CBC      Status: Abnormal   Collection Time: 10/19/22  2:13 PM  Result Value Ref Range   WBC 10.7 (H) 4.0 - 10.5 K/uL   RBC 4.62 3.87 - 5.11 MIL/uL   Hemoglobin 14.7 12.0 - 15.0 g/dL   HCT 42.4 36.0 - 46.0 %   MCV 91.8 80.0 - 100.0 fL   MCH 31.8 26.0 - 34.0 pg   MCHC 34.7 30.0 - 36.0 g/dL   RDW 12.8 11.5 - 15.5 %   Platelets 251 150 - 400 K/uL   nRBC 0.0 0.0 - 0.2 %  Comment: Performed at [facility], [address] [place], [place] [address]  Troponin I (High Sensitivity)     Status: None   Collection Time: 10/19/22  2:13 PM  Result Value Ref Range   Troponin I (High Sensitivity) 4 <18 ng/L    Comment: (NOTE) Elevated high sensitivity troponin I (hsTnI) values and significant  changes across serial measurements may suggest ACS but many other  chronic and acute conditions are known to elevate hsTnI results.  Refer to the "Links" section for chest pain algorithms and additional  guidance. Performed at [facility], [address] [place], [place] [address]   D-dimer, quantitative     Status: Abnormal   Collection Time: 10/19/22  4:05 PM  Result Value Ref Range   D-Dimer, Quant 0.97 (H) 0.00 - 0.50 ug/mL-FEU    Comment: (NOTE) At the manufacturer cut-off value of 0.5 g/mL FEU, this assay has a negative predictive value of 95-100%.This assay is intended for use in conjunction with a clinical pretest probability (PTP) assessment model to exclude pulmonary embolism (PE) and deep venous thrombosis (DVT) in outpatients suspected of PE or DVT. Results should be correlated with clinical presentation. Performed at [facility], [place], [place] [address]   Troponin I (High Sensitivity)     Status: None   Collection Time: 10/19/22  7:26 PM  Result Value Ref Range   Troponin I (High Sensitivity) 6 <18 ng/L    Comment: (NOTE) Elevated high sensitivity troponin I (hsTnI) values and significant  changes across serial measurements may suggest  ACS but many other  chronic and acute conditions are known to elevate hsTnI results.  Refer to the "Links" section for chest pain algorithms and additional  guidance. Performed at [facility], [address] [place], [place] [address]     In Ed pt received  Meds ordered this encounter  Medications   DISCONTD: methylPREDNISolone sodium succinate (SOLU-MEDROL) 40 mg/mL injection 40 mg    IV methylprednisolone will be converted to either a q12h or q24h frequency with the same total daily dose (TDD).  Ordered Dose: 1 to 125 mg TDD; convert to: TDD q24h.  Ordered Dose: 126 to 250 mg TDD; convert to: TDD div q12h.  Ordered Dose: >250 mg TDD; DAW.   DISCONTD: diphenhydrAMINE (BENADRYL) capsule 50 mg   DISCONTD: diphenhydrAMINE (BENADRYL) injection 50 mg   lidocaine (LIDODERM) 5 % 1 patch   fentaNYL (SUBLIMAZE) injection 50 mcg   enoxaparin (LOVENOX) injection 65 mg   enoxaparin (LOVENOX) injection 65 mg   rosuvastatin (CRESTOR) tablet 5 mg   aspirin EC tablet 81 mg   sodium chloride flush (NS) 0.9 % injection 3 mL   0.9 %  sodium chloride infusion   OR Linked Order Group    acetaminophen (TYLENOL) tablet 650 mg    acetaminophen (TYLENOL) suppository 650 mg   morphine (PF) 2 MG/ML injection 2 mg   nitroGLYCERIN (NITROSTAT) SL tablet 0.4 mg    Unresulted Labs (From admission, onward)     Start     Ordered   10/20/22 0500  Comprehensive metabolic panel  Tomorrow morning,   STAT        10/19/22 2133   10/20/22 0500  CBC  Tomorrow morning,   STAT        10/19/22 2133   10/20/22 0333  Brain natriuretic peptide  Add-on,   AD        10/20/22 0332   10/20/22 0332  T4,  free  Add-on,   AD        10/20/22 0331   10/20/22 0332  TSH  Add-on,   AD        10/20/22 0331   10/20/22 0332  High sensitivity CRP  Once,   R        10/20/22 0331            Admission Imaging : US Venous Img Lower Bilateral  Result Date: 10/19/2022 CLINICAL DATA:  Elevated D-dimer. EXAM:  BILATERAL LOWER EXTREMITY VENOUS DOPPLER ULTRASOUND TECHNIQUE: Gray-scale sonography with graded compression, as well as color Doppler and duplex ultrasound were performed to evaluate the lower extremity deep venous systems from the level of the common femoral vein and including the common femoral, femoral, profunda femoral, popliteal and calf veins including the posterior tibial, peroneal and gastrocnemius veins when visible. The superficial great saphenous vein was also interrogated. Spectral Doppler was utilized to evaluate flow at rest and with distal augmentation maneuvers in the common femoral, femoral and popliteal veins. COMPARISON:  None Available. FINDINGS: RIGHT LOWER EXTREMITY Common Femoral Vein: No evidence of thrombus. Normal compressibility, respiratory phasicity and response to augmentation. Saphenofemoral Junction: No evidence of thrombus. Normal compressibility and flow on color Doppler imaging. Profunda Femoral Vein: No evidence of thrombus. Normal compressibility and flow on color Doppler imaging. Femoral Vein: No evidence of thrombus. Normal compressibility, respiratory phasicity and response to augmentation. Popliteal Vein: No evidence of thrombus. Normal compressibility, respiratory phasicity and response to augmentation. Calf Veins: No evidence of thrombus. Normal compressibility and flow on color Doppler imaging. Superficial Great Saphenous Vein: No evidence of thrombus. Normal compressibility. Venous Reflux:  None. Other Findings:  None. LEFT LOWER EXTREMITY Common Femoral Vein: No evidence of thrombus. Normal compressibility, respiratory phasicity and response to augmentation. Saphenofemoral Junction: No evidence of thrombus. Normal compressibility and flow on color Doppler imaging. Profunda Femoral Vein: No evidence of thrombus. Normal compressibility and flow on color Doppler imaging. Femoral Vein: No evidence of thrombus. Normal compressibility, respiratory phasicity and response to  augmentation. Popliteal Vein: No evidence of thrombus. Normal compressibility, respiratory phasicity and response to augmentation. Calf Veins: No evidence of thrombus. Normal compressibility and flow on color Doppler imaging. Superficial Great Saphenous Vein: No evidence of thrombus. Normal compressibility. Venous Reflux:  None. Other Findings:  None. IMPRESSION: No evidence of deep venous thrombosis in either lower extremity. Electronically Signed   By: Virgina Norfolk M.D.   On: 10/19/2022 19:15   DG Chest 2 View  Result Date: 10/19/2022 CLINICAL DATA:  Back pain radiating to the chest. EXAM: CHEST - 2 VIEW COMPARISON:  Chest radiograph 11/06/2021 FINDINGS: The cardiomediastinal silhouette is normal. There is no focal consolidation or pulmonary edema. There is no pleural effusion or pneumothorax There is no acute osseous abnormality. IMPRESSION: No radiographic evidence of acute cardiopulmonary process. Electronically Signed   By: Valetta Mole M.D.   On: 10/19/2022 14:52      Physical Examination: Vitals:   10/20/22 0100 10/20/22 0130 10/20/22 0146 10/20/22 0230  BP: 136/66 133/62  (!) 138/54  Pulse: 60 (!) 56  (!) 56  Temp:   97.8 F (36.6 C)   Resp: '18 16  18  '$ Weight:      SpO2: 96% 97%  97%  TempSrc:   Oral    Physical Exam Vitals and nursing note reviewed.  Constitutional:      General: She is not in acute distress.    Appearance: Normal appearance. She is not ill-appearing, toxic-appearing  or diaphoretic.  HENT:     Head: Normocephalic and atraumatic.     Right Ear: Hearing and external ear normal.     Left Ear: Hearing and external ear normal.     Nose: Nose normal. No nasal deformity.     Mouth/Throat:     Lips: Pink.     Mouth: Mucous membranes are moist.     Tongue: No lesions.     Pharynx: Oropharynx is clear.  Eyes:     Extraocular Movements: Extraocular movements intact.     Pupils: Pupils are equal, round, and reactive to light.  Neck:     Vascular: No carotid  bruit.  Cardiovascular:     Rate and Rhythm: Normal rate and regular rhythm.     Pulses: Normal pulses.     Heart sounds: Normal heart sounds.  Pulmonary:     Effort: Pulmonary effort is normal.     Breath sounds: Normal breath sounds.  Abdominal:     General: Bowel sounds are normal. There is no distension.     Palpations: Abdomen is soft. There is no mass.     Tenderness: There is no abdominal tenderness. There is no guarding.     Hernia: No hernia is present.  Musculoskeletal:       Arms:     Right lower leg: No edema.     Left lower leg: No edema.  Skin:    General: Skin is warm.  Neurological:     General: No focal deficit present.     Mental Status: She is alert and oriented to person, place, and time.     Cranial Nerves: Cranial nerves 2-12 are intact.     Motor: Motor function is intact.  Psychiatric:        Attention and Perception: Attention normal.        Mood and Affect: Mood normal.        Speech: Speech normal.        Behavior: Behavior normal. Behavior is cooperative.        Cognition and Memory: Cognition normal.        Assessment and Plan: * Chest pain PT at moderate risk for heart disease. We will obtain cardiology consult for eval for chest pain.  Along with eval for PE. EKG shows SR 64 with incomplete RBBB.   Essential hypertension Vitals:   10/19/22 1418 10/19/22 1743 10/19/22 2038  BP: (!) 162/72 (!) 150/67 139/65  Continue patient's regimen with HCTZ 12.5, aspirin 81 and statin therapy. We will currently hold amlodipine.   GERD (gastroesophageal reflux disease) IV ppi.      DVT prophylaxis:  Lovenox    Code Status:  Full code    Family Communication:  Hietpas,Paris D (Spouse)  [phone]    Disposition Plan:  Home    Consults called:  None   Admission status: Observation.    Unit/ Expected LOS: Med tele/ 1 days   Para Skeans MD [facility]  6 PM- 2 AM. Please contact me via secure Chat 6 PM-2  AM. [phone] ( Pager ) To contact the [facility] Attending or Consulting provider [place] or covering provider during after hours [place], for this patient.   Check the care team in [facility] and look for a) attending/consulting [facility] provider listed and b) the [facility] team listed Log into [email or web address] and use [place]'s universal password to access. If you do not have the password, please contact the hospital operator. Locate the [facility]  provider you are looking for under [facility] and page to a number that you can be directly reached. If you still have difficulty reaching the provider, please page the [facility] (Director on Call) for the Hospitalists listed on amion for assistance. [email or web address] 10/20/2022, 3:32 AM

## 2022-10-19 NOTE — ED Notes (Signed)
Patient ambulated to and from hallway bathroom with a steady gait. Patient given socks to wear.

## 2022-10-19 NOTE — ED Notes (Signed)
Patient taken to restroom at this time by [place], Kemper Durie

## 2022-10-19 NOTE — ED Provider Notes (Signed)
[facility] Provider Note    Event Date/Time   First MD Initiated Contact with Patient 10/19/22 1541     (approximate)   History   Chest Pain   HPI  Cynthia Keith is a 76 y.o. 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  She works as a Theme park manager.  No history of heart disease lung disease.  She does not smoke.  Denies any shortness of breath.  Pain is worsened with movement of the left arm as well as palpation along the scapula.  Denies any abdominal pain.  No nausea or vomiting.  No rashes.     Physical Exam   Triage Vital Signs: ED Triage Vitals  Enc Vitals Group     BP 10/19/22 1418 (!) 162/72     Pulse Rate 10/19/22 1418 64     Resp 10/19/22 1418 18     Temp 10/19/22 1418 98.3 F (36.8 C)     Temp Source 10/19/22 1743 Oral     SpO2 10/19/22 1418 97 %     Weight 10/19/22 1420 145 lb (65.8 kg)     Height --      Head Circumference --      Peak Flow --      Pain Score 10/19/22 1420 4     Pain Loc --      Pain Edu? --      Excl. in [place]? --     Most recent vital signs: Vitals:   10/19/22 1418 10/19/22 1743  BP: (!) 162/72 (!) 150/67  Pulse: 64 62  Resp: 18 19  Temp: 98.3 F (36.8 C) 97.8 F (36.6 C)  SpO2: 97% 98%     Constitutional: Alert  Eyes: Conjunctivae are normal.  Head: Atraumatic. Nose: No congestion/rhinnorhea. Mouth/Throat: Mucous membranes are moist.   Neck: Painless ROM.  Cardiovascular:   Good peripheral circulation. Respiratory: Normal respiratory effort.  No retractions.  Gastrointestinal: Soft and nontender.  Musculoskeletal:  no deformity Neurologic:  MAE spontaneously. No gross focal neurologic deficits are appreciated.  Skin:  Skin is warm, dry and intact. No rash noted. Psychiatric: Mood and affect are normal. Speech and behavior are normal.    ED Results / Procedures / Treatments   Labs (all labs ordered are listed,  but only abnormal results are displayed) Labs Reviewed  BASIC METABOLIC PANEL - Abnormal; Notable for the following components:      Result Value   Potassium 3.4 (*)    Glucose, Bld 100 (*)    All other components within normal limits  CBC - Abnormal; Notable for the following components:   WBC 10.7 (*)    All other components within normal limits  D-DIMER, QUANTITATIVE - Abnormal; Notable for the following components:   D-Dimer, Quant 0.97 (*)    All other components within normal limits  TROPONIN I (HIGH SENSITIVITY)  TROPONIN I (HIGH SENSITIVITY)     EKG  ED ECG REPORT I, Merlyn Lot, the attending physician, personally viewed and interpreted this ECG.   Date: 10/19/2022  EKG Time: 14:24  Rate: 65  Rhythm: sinus  Axis: left  Intervals:iRBBB  ST&T Change: no stemi, no depressions    RADIOLOGY Please see ED Course for my review and interpretation.  I personally reviewed all radiographic images ordered to evaluate for the above acute complaints and reviewed radiology reports and findings.  These findings were personally discussed with  the patient.  Please see medical record for radiology report.    PROCEDURES:  Critical Care performed: No  Procedures   MEDICATIONS ORDERED IN ED: Medications  lidocaine (LIDODERM) 5 % 1 patch (1 patch Transdermal Patch Applied 10/19/22 1735)  fentaNYL (SUBLIMAZE) injection 50 mcg (has no administration in time range)     IMPRESSION / MDM / ASSESSMENT AND PLAN / ED COURSE  I reviewed the triage vital signs and the nursing notes.                              Differential diagnosis includes, but is not limited to, msk pain, pleurisy, ACS, pericarditis, esophagitis,  pe, dissection, pna, bronchitis, costochondritis  Patient presenting to the ER for evaluation of symptoms as described above.  Based on symptoms, risk factors and considered above differential, this presenting complaint could reflect a potentially  life-threatening illness therefore the patient will be placed on continuous pulse oximetry and telemetry for monitoring.  Laboratory evaluation will be sent to evaluate for the above complaints.  Patient not hypoxic not tachycardic denies any pain at rest but is reproduced with movement.  No lower extremity swelling.  She is low risk by Wells criteria will send D-dimer to further restratify.  Initial troponin ordered and is negative.  Will observe on cardiac monitor for repeat troponin.  Her abdominal exam is soft and benign.  Have low suspicion for intra-abdominal process.  Denies any consistent with dissection.   Clinical Course as of 10/19/22 2005  Fri Oct 19, 2022  1657 Patient's D-dimer is elevated.  Clinically have a lower suspicion for D-dimer as it seems very musculoskeletal reproducible with palpation.  Will order v/q  [PR]  2956 Unfortunately do not have V/Q capabilities at the hospital currently ordered lower extremity duplex to evaluate for DVT. [PR]  1938 Duplex without any evidence of DVT.  Patient currently pain-free but the pain is reproducible with palpation on the left scapula no sign of shingles.  Discussed work-up thus far and limitation due to inability to rule out fully pulmonary embolism with VQ scan.  Discussed recommendation for admission to the hospital for hemodynamic monitoring V/Q in the morning patient feels well at this point her pain seems quite musculoskeletal in nature patient would prefer discharge for later follow-up. [PR]  2003 Repeat troponin is negative patient reassessed and now complaining of worsening left shoulder pain.  Will give additional pain medication but as she is having recurrent chest pain given her age risk factors elevated D-dimer unable to rule out PE I do believe not feel comfortable discharging her at this time will consult hospitalist for admission. [PR]    Clinical Course User Index [PR] Merlyn Lot, MD      FINAL CLINICAL IMPRESSION(S)  / ED DIAGNOSES   Final diagnoses:  Atypical chest pain     Rx / DC Orders   ED Discharge Orders     None        Note:  This document was prepared using Dragon voice recognition software and may include unintentional dictation errors.    Merlyn Lot, MD 10/19/22 2005

## 2022-10-19 NOTE — Assessment & Plan Note (Signed)
Vitals:   10/19/22 1418 10/19/22 1743 10/19/22 2038  BP: (!) 162/72 (!) 150/67 139/65  Continue patient's regimen with HCTZ 12.5, aspirin 81 and statin therapy. We will currently hold amlodipine.

## 2022-10-20 ENCOUNTER — Encounter: Payer: Self-pay | Admitting: Internal Medicine

## 2022-10-20 ENCOUNTER — Observation Stay: Payer: Medicare HMO

## 2022-10-20 ENCOUNTER — Observation Stay
Admit: 2022-10-20 | Discharge: 2022-10-20 | Disposition: A | Discharge status: Home / Self Care | Payer: Medicare HMO | Attending: Student | Admitting: Student

## 2022-10-20 DIAGNOSIS — M25512 Pain in left shoulder: Secondary | ICD-10-CM | POA: Diagnosis not present

## 2022-10-20 DIAGNOSIS — R7989 Other specified abnormal findings of blood chemistry: Secondary | ICD-10-CM | POA: Diagnosis not present

## 2022-10-20 DIAGNOSIS — R0789 Other chest pain: Secondary | ICD-10-CM | POA: Diagnosis not present

## 2022-10-20 DIAGNOSIS — R079 Chest pain, unspecified: Secondary | ICD-10-CM | POA: Diagnosis not present

## 2022-10-20 LAB — CBC
HCT: 39.5 % (ref 36.0–46.0)
Hemoglobin: 13.4 g/dL (ref 12.0–15.0)
MCH: 31.4 pg (ref 26.0–34.0)
MCHC: 33.9 g/dL (ref 30.0–36.0)
MCV: 92.5 fL (ref 80.0–100.0)
Platelets: 215 10*3/uL (ref 150–400)
RBC: 4.27 MIL/uL (ref 3.87–5.11)
RDW: 12.8 % (ref 11.5–15.5)
WBC: 6.5 10*3/uL (ref 4.0–10.5)
nRBC: 0 % (ref 0.0–0.2)

## 2022-10-20 LAB — TSH: TSH: 2.133 u[IU]/mL (ref 0.350–4.500)

## 2022-10-20 LAB — LIPID PANEL
Cholesterol: 176 mg/dL (ref 0–200)
HDL: 50 mg/dL (ref >40)
LDL Cholesterol: 109 mg/dL — ABNORMAL HIGH (ref 0–99)
Total CHOL/HDL Ratio: 3.5 RATIO
Triglycerides: 87 mg/dL (ref <150)
VLDL: 17 mg/dL (ref 0–40)

## 2022-10-20 LAB — COMPREHENSIVE METABOLIC PANEL
ALT: 27 U/L (ref 0–44)
AST: 30 U/L (ref 15–41)
Albumin: 3.7 g/dL (ref 3.5–5.0)
Alkaline Phosphatase: 62 U/L (ref 38–126)
Anion gap: 6 (ref 5–15)
BUN: 13 mg/dL (ref 8–23)
CO2: 26 mmol/L (ref 22–32)
Calcium: 9.3 mg/dL (ref 8.9–10.3)
Chloride: 107 mmol/L (ref 98–111)
Creatinine, Ser: 0.65 mg/dL (ref 0.44–1.00)
GFR, Estimated: >60 mL/min (ref >60)
Glucose, Bld: 107 mg/dL — ABNORMAL HIGH (ref 70–99)
Potassium: 3.2 mmol/L — ABNORMAL LOW (ref 3.5–5.1)
Sodium: 139 mmol/L (ref 135–145)
Total Bilirubin: 0.6 mg/dL (ref 0.3–1.2)
Total Protein: 7 g/dL (ref 6.5–8.1)

## 2022-10-20 LAB — MAGNESIUM: Magnesium: 2.2 mg/dL (ref 1.7–2.4)

## 2022-10-20 LAB — ECHOCARDIOGRAM COMPLETE
AR max vel: 2.35 cm2
AV Area VTI: 2.24 cm2
AV Area mean vel: 2.34 cm2
AV Mean grad: 5 mmHg
AV Peak grad: 10.9 mmHg
Ao pk vel: 1.65 m/s
Area-P 1/2: 3.89 cm2
Height: 56 in
MV VTI: 2.59 cm2
S' Lateral: 2.8 cm
Weight: 2306.89 oz

## 2022-10-20 LAB — BRAIN NATRIURETIC PEPTIDE: B Natriuretic Peptide: 18.1 pg/mL (ref 0.0–100.0)

## 2022-10-20 LAB — T4, FREE: Free T4: 0.88 ng/dL (ref 0.61–1.12)

## 2022-10-20 LAB — PHOSPHORUS: Phosphorus: 2.9 mg/dL (ref 2.5–4.6)

## 2022-10-20 MED ORDER — POTASSIUM CHLORIDE CRYS ER 20 MEQ PO TBCR
40.0000 meq | EXTENDED_RELEASE_TABLET | ORAL | Status: DC
  Administered 2022-10-20: 40 meq via ORAL
  Filled 2022-10-20: qty 2

## 2022-10-20 MED ORDER — NITROGLYCERIN 0.4 MG SL SUBL: 0.4000 mg | SUBLINGUAL_TABLET | SUBLINGUAL | Status: DC | PRN

## 2022-10-20 MED ORDER — TECHNETIUM TO 99M ALBUMIN AGGREGATED
4.3800 | Freq: Once | INTRAVENOUS | Status: AC | PRN
  Administered 2022-10-20: 4.38 via INTRAVENOUS

## 2022-10-20 MED ORDER — AMLODIPINE BESYLATE 10 MG PO TABS
10.0000 mg | ORAL_TABLET | Freq: Every day | ORAL | Status: DC
  Administered 2022-10-20: 10 mg via ORAL
  Filled 2022-10-20: qty 1

## 2022-10-20 NOTE — Progress Notes (Addendum)
Patient off unit via wheelchair for testing at this time. 1002 Patient back on unit.

## 2022-10-20 NOTE — Progress Notes (Signed)
*  PRELIMINARY RESULTS* Echocardiogram 2D Echocardiogram has been performed.  Cynthia Keith 10/20/2022, 1:02 PM

## 2022-10-20 NOTE — Discharge Summary (Signed)
[facility] Discharge Summary   Patient: Cynthia Keith ZOX:[id number]  PCP: Lavera Guise, MD  Date of admission: 10/19/2022   Date of discharge:  10/20/2022     Discharge Diagnoses:  Principal Problem:   Chest pain Active Problems:   GERD (gastroesophageal reflux disease)   Essential hypertension   Admitted From: Home Disposition:  Home   Recommendations for Outpatient Follow-up:  PCP: Follow-up with PCP in 1 week, stress test can be done as an outpatient if persistent chest pain. Monitor BP at home and follow with PCP to titrate medications accordingly.  Repeat 2D echocardiogram after 4 to 6 months. Follow-up with cardiology next week Follow up LABS/TEST: Stress test as an outpatient if persistent chest pain.  Repeat 2D echocardiogram after 4 to 6 months.   Diet recommendation: Cardiac diet  Activity: The patient is advised to gradually reintroduce usual activities, as tolerated  Discharge Condition: stable  Code Status: Full code   History of present illness: As per the H and P dictated on admission  Hospital Course:  [name] is an 76 y.o. female   left sided back pain radiating to left shoulder and neck. Started while at work at [employer]. It got worse with  working, and friends told her to get to ed. Pt is a hair stylist. No NSAID use, no tobacco, Pt has heart score of  5 putting her at moderate risk for ischemic heart disease. Pain is worse with movement.  ED work-up, no significant ischemic EKG changes, troponin negative.  D-dimer slightly elevated, patient was admitted for further management as below. # Back pain, suspected chest pain due to ACS, which has been ruled out. EKG EKG shows SR 64 with incomplete RBBB.  Troponin x2 negative VQ scan negative for PE, D-dimer 0.97 slightly elevated, venous duplex of lower extremity negative for DVT.  Patient has localized tenderness on the left side of back, increased with movement of the shoulder, reproducible  tenderness most likely musculoskeletal pain.  Patient was seen by cardiology, recommended to follow-up as an outpatient, no ischemic work-up at this time.   TTE shows LVEF 65 to 70%, no wall motion abnormality.  Grade 1 diastolic dysfunction.  Right ventricle systolic function mildly reduced, mildly enlarged, increased thickness.  Normal pulmonary artery pressure.  Left atrium and right atrium mild to moderately dilated.  Mild to moderate mitral valve regurgitation and tricuspid valve regurgitation. Follow with cardiology to repeat 2D echocardiogram after 4 to 6 months. Continue aspirin 81 mg p.o. daily and Crestor 5 mg p.o. daily. # Hypertension, continued home dose amlodipine 10 mg p.o. daily # GERD continued PPI  Body mass index is 32.32 kg/m.   Nutrition Interventions:     Patient was ambulatory without any assistance. On the day of the discharge the patient's vitals were stable, and no other acute medical condition were reported by patient. the patient was felt safe to be discharge at Home .  Consultants: Cardiology Procedures: None  Discharge Exam: General: Appear in no distress, no Rash; Oral Mucosa Clear, moist. Cardiovascular: S1 and S2 Present, no Murmur, Respiratory: normal respiratory effort, Bilateral Air entry present and no Crackles, no wheezes Abdomen: Bowel Sound present, Soft and no tenderness, no hernia Extremities: no Pedal edema, no calf tenderness Neurology: alert and oriented to time, place, and person affect appropriate.  Filed Weights   10/19/22 1420 10/20/22 0614  Weight: 65.8 kg 65.4 kg   Vitals:   10/20/22 0605 10/20/22 1012  BP: Marland Kitchen)  168/73 (!) 153/70  Pulse: 63   Resp: 18 18  Temp: 97.7 F (36.5 C) 98.2 F (36.8 C)  SpO2: 98%     DISCHARGE MEDICATION: Allergies as of 10/20/2022       Reactions   Iodinated Contrast Media Anaphylaxis   Remote hx of respiratory arrest after IV iodine for kidney study. Immediately treated w/o sequela. No  previous or subsequent IVP exposure. Remote hx of respiratory arrest after IV iodine for kidney study. Immediately treated w/o sequela. No previous or subsequent IVP exposure. Remote hx of respiratory arrest after IV iodine for kidney study. Immediately treated w/o sequela. No previous or subsequent IVP exposure.   Iodine Shortness Of Breath   "Quit Breathing"        Medication List     STOP taking these medications    clobetasol cream 0.05 % Commonly known as: TEMOVATE   hydrochlorothiazide 12.5 MG tablet Commonly known as: HYDRODIURIL   multivitamin tablet   sulfamethoxazole-trimethoprim 800-160 MG tablet Commonly known as: BACTRIM DS       TAKE these medications    amLODipine 10 MG tablet Commonly known as: NORVASC TAKE 1 TABLET BY MOUTH EVERY DAY   aspirin EC 81 MG tablet Take 81 mg by mouth daily.   omeprazole 20 MG capsule Commonly known as: PRILOSEC TAKE 1 CAPSULE BY MOUTH EVERY DAY   rosuvastatin 5 MG tablet Commonly known as: CRESTOR TAKE 1 TABLET (5 MG TOTAL) BY MOUTH DAILY.       Allergies  Allergen Reactions   Iodinated Contrast Media Anaphylaxis    Remote hx of respiratory arrest after IV iodine for kidney study. Immediately treated w/o sequela. No previous or subsequent IVP exposure. Remote hx of respiratory arrest after IV iodine for kidney study. Immediately treated w/o sequela. No previous or subsequent IVP exposure. Remote hx of respiratory arrest after IV iodine for kidney study. Immediately treated w/o sequela. No previous or subsequent IVP exposure.   Iodine Shortness Of Breath    "Quit Breathing"   Discharge Instructions     Call MD for:  difficulty breathing, headache or visual disturbances   Complete by: As directed    Call MD for:  persistant dizziness or light-headedness   Complete by: As directed    Call MD for:  severe uncontrolled pain   Complete by: As directed    Diet - low sodium heart healthy   Complete by: As  directed    Discharge instructions   Complete by: As directed    Follow-up with PCP in 1 week, stress test can be done as an outpatient if persistent chest pain. Monitor BP at home and follow with PCP to titrate medications accordingly.  Repeat 2D echocardiogram after 4 to 6 months.   Increase activity slowly   Complete by: As directed        The results of significant diagnostics from this hospitalization (including imaging, microbiology, ancillary and laboratory) are listed below for reference.    Significant Diagnostic Studies: ECHOCARDIOGRAM COMPLETE  Result Date: 10/20/2022    ECHOCARDIOGRAM REPORT   Patient Name:   [name] Date of Exam: 10/20/2022 Medical Rec #:  [id number]      Height:       56.0 in Accession #:    [id number]     Weight:       144.2 lb Date of Birth:  09-08-1946     BSA:          1.545 m Patient Age:  75 years       BP:           153/70 mmHg Patient Gender: F              HR:           66 bpm. Exam Location:  [facility] Procedure: 2D Echo, Cardiac Doppler and Color Doppler Indications:     Chest Pain  History:         Patient has no prior history of Echocardiogram examinations.                  Signs/Symptoms:Fatigue and Chest Pain; Risk                  Factors:Hypertension.  Sonographer:     Wenda Low Referring Phys:  [place] Diagnosing Phys: [place]  1. Left ventricular ejection fraction, by estimation, is 65 to 70%. The left ventricle has normal function. The left ventricle has no regional wall motion abnormalities. There is mild left ventricular hypertrophy. Left ventricular diastolic parameters are consistent with Grade I diastolic dysfunction (impaired relaxation).  2. Right ventricular systolic function is mildly reduced. The right ventricular size is mildly enlarged. Mildly increased right ventricular wall thickness. There is normal pulmonary artery systolic pressure.  3. Left atrial size was mild to moderately dilated.  4.  Right atrial size was mild to moderately dilated.  5. The mitral valve is grossly normal. Mild to moderate mitral valve regurgitation.  6. Tricuspid valve regurgitation is mild to moderate.  7. The aortic valve is calcified. Aortic valve regurgitation is mild to moderate. Aortic valve sclerosis/calcification is present, without any evidence of aortic stenosis. FINDINGS  Left Ventricle: Left ventricular ejection fraction, by estimation, is 65 to 70%. The left ventricle has normal function. The left ventricle has no regional wall motion abnormalities. The left ventricular internal cavity size was normal in size. There is  mild left ventricular hypertrophy. Left ventricular diastolic parameters are consistent with Grade I diastolic dysfunction (impaired relaxation). Right Ventricle: The right ventricular size is mildly enlarged. Mildly increased right ventricular wall thickness. Right ventricular systolic function is mildly reduced. There is normal pulmonary artery systolic pressure. The tricuspid regurgitant velocity is 2.02 m/s, and with an assumed right atrial pressure of 3 mmHg, the estimated right ventricular systolic pressure is 04.5 mmHg. Left Atrium: Left atrial size was mild to moderately dilated. Right Atrium: Right atrial size was mild to moderately dilated. Pericardium: There is no evidence of pericardial effusion. Mitral Valve: The mitral valve is grossly normal. Mild to moderate mitral valve regurgitation. MV peak gradient, 5.2 mmHg. The mean mitral valve gradient is 2.0 mmHg. Tricuspid Valve: The tricuspid valve is grossly normal. Tricuspid valve regurgitation is mild to moderate. Aortic Valve: The aortic valve is calcified. Aortic valve regurgitation is mild to moderate. Aortic valve sclerosis/calcification is present, without any evidence of aortic stenosis. Aortic valve mean gradient measures 5.0 mmHg. Aortic valve peak gradient measures 10.9 mmHg. Aortic valve area, by VTI measures 2.24 cm. Pulmonic  Valve: The pulmonic valve was grossly normal. Pulmonic valve regurgitation is mild. Aorta: The aortic root, ascending aorta and aortic arch are all structurally normal, with no evidence of dilitation or obstruction. IAS/Shunts: No atrial level shunt detected by color flow Doppler.  LEFT VENTRICLE PLAX 2D LVIDd:         4.10 cm   Diastology LVIDs:         2.80 cm   LV e' medial:  5.77 cm/s LV PW:         1.00 cm   LV E/e' medial:  14.3 LV IVS:        1.10 cm   LV e' lateral:   8.59 cm/s LVOT diam:     1.90 cm   LV E/e' lateral: 9.6 LV SV:         84 LV SV Index:   54 LVOT Area:     2.84 cm  RIGHT VENTRICLE RV Basal diam:  3.00 cm RV Mid diam:    3.00 cm RV S prime:     18.00 cm/s TAPSE (M-mode): 2.4 cm LEFT ATRIUM             Index        RIGHT ATRIUM           Index LA diam:        4.10 cm 2.65 cm/m   RA Area:     12.00 cm LA Vol (A2C):   43.7 ml 28.29 ml/m  RA Volume:   24.80 ml  16.06 ml/m LA Vol (A4C):   45.9 ml 29.71 ml/m LA Biplane Vol: 45.9 ml 29.71 ml/m  AORTIC VALVE AV Area (Vmax):    2.35 cm AV Area (Vmean):   2.34 cm AV Area (VTI):     2.24 cm AV Vmax:           165.00 cm/s AV Vmean:          104.000 cm/s AV VTI:            0.374 m AV Peak Grad:      10.9 mmHg AV Mean Grad:      5.0 mmHg LVOT Vmax:         137.00 cm/s LVOT Vmean:        85.900 cm/s LVOT VTI:          0.296 m LVOT/AV VTI ratio: 0.79  AORTA Ao Root diam: 2.90 cm Ao Asc diam:  3.20 cm MITRAL VALVE                TRICUSPID VALVE MV Area (PHT): 3.89 cm     TR Peak grad:   16.3 mmHg MV Area VTI:   2.59 cm     TR Vmax:        202.00 cm/s MV Peak grad:  5.2 mmHg MV Mean grad:  2.0 mmHg     SHUNTS MV Vmax:       1.14 m/s     Systemic VTI:  0.30 m MV Vmean:      58.4 cm/s    Systemic Diam: 1.90 cm MV Decel Time: 195 msec MV E velocity: 82.30 cm/s MV A velocity: 100.00 cm/s MV E/A ratio:  0.82 Shaukat Khan Electronically signed by Neoma Laming Signature Date/Time: 10/20/2022/2:07:16 PM    Final    NM Pulmonary Perfusion  Result  Date: 10/20/2022 CLINICAL DATA:  Elevated D-dimer level. CT IV contrast allergy. Left shoulder and posterior back pain radiating to the neck. EXAM: NUCLEAR MEDICINE PERFUSION LUNG SCAN TECHNIQUE: Perfusion images were obtained in multiple projections after intravenous injection of radiopharmaceutical. Ventilation scans intentionally deferred if perfusion scan and chest x-ray adequate for interpretation during COVID 19 epidemic. RADIOPHARMACEUTICALS:  4.4 mCi Tc-51mMAA IV COMPARISON:  Chest radiograph 10/19/2022 FINDINGS: Expected pulmonary contour and appearance with no perfusion defects identified to indicate pulmonary embolus. IMPRESSION: 1. Normal perfusion lung scan, without perfusion defect to indicate pulmonary embolus. Electronically Signed  By: Van Clines M.D.   On: 10/20/2022 10:04   US Venous Img Lower Bilateral  Result Date: 10/19/2022 CLINICAL DATA:  Elevated D-dimer. EXAM: BILATERAL LOWER EXTREMITY VENOUS DOPPLER ULTRASOUND TECHNIQUE: Gray-scale sonography with graded compression, as well as color Doppler and duplex ultrasound were performed to evaluate the lower extremity deep venous systems from the level of the common femoral vein and including the common femoral, femoral, profunda femoral, popliteal and calf veins including the posterior tibial, peroneal and gastrocnemius veins when visible. The superficial great saphenous vein was also interrogated. Spectral Doppler was utilized to evaluate flow at rest and with distal augmentation maneuvers in the common femoral, femoral and popliteal veins. COMPARISON:  None Available. FINDINGS: RIGHT LOWER EXTREMITY Common Femoral Vein: No evidence of thrombus. Normal compressibility, respiratory phasicity and response to augmentation. Saphenofemoral Junction: No evidence of thrombus. Normal compressibility and flow on color Doppler imaging. Profunda Femoral Vein: No evidence of thrombus. Normal compressibility and flow on color Doppler imaging.  Femoral Vein: No evidence of thrombus. Normal compressibility, respiratory phasicity and response to augmentation. Popliteal Vein: No evidence of thrombus. Normal compressibility, respiratory phasicity and response to augmentation. Calf Veins: No evidence of thrombus. Normal compressibility and flow on color Doppler imaging. Superficial Great Saphenous Vein: No evidence of thrombus. Normal compressibility. Venous Reflux:  None. Other Findings:  None. LEFT LOWER EXTREMITY Common Femoral Vein: No evidence of thrombus. Normal compressibility, respiratory phasicity and response to augmentation. Saphenofemoral Junction: No evidence of thrombus. Normal compressibility and flow on color Doppler imaging. Profunda Femoral Vein: No evidence of thrombus. Normal compressibility and flow on color Doppler imaging. Femoral Vein: No evidence of thrombus. Normal compressibility, respiratory phasicity and response to augmentation. Popliteal Vein: No evidence of thrombus. Normal compressibility, respiratory phasicity and response to augmentation. Calf Veins: No evidence of thrombus. Normal compressibility and flow on color Doppler imaging. Superficial Great Saphenous Vein: No evidence of thrombus. Normal compressibility. Venous Reflux:  None. Other Findings:  None. IMPRESSION: No evidence of deep venous thrombosis in either lower extremity. Electronically Signed   By: Virgina Norfolk M.D.   On: 10/19/2022 19:15   DG Chest 2 View  Result Date: 10/19/2022 CLINICAL DATA:  Back pain radiating to the chest. EXAM: CHEST - 2 VIEW COMPARISON:  Chest radiograph 11/06/2021 FINDINGS: The cardiomediastinal silhouette is normal. There is no focal consolidation or pulmonary edema. There is no pleural effusion or pneumothorax There is no acute osseous abnormality. IMPRESSION: No radiographic evidence of acute cardiopulmonary process. Electronically Signed   By: Valetta Mole M.D.   On: 10/19/2022 14:52    Microbiology: No results found for  this or any previous visit (from the past 240 hour(s)).   Labs: CBC: Recent Labs  Lab 10/19/22 1413 10/20/22 0656  WBC 10.7* 6.5  HGB 14.7 13.4  HCT 42.4 39.5  MCV 91.8 92.5  PLT 251 185   Basic Metabolic Panel: Recent Labs  Lab 10/19/22 1413 10/20/22 0650 10/20/22 0656  NA 138  --  139  K 3.4*  --  3.2*  CL 104  --  107  CO2 24  --  26  GLUCOSE 100*  --  107*  BUN 14  --  13  CREATININE 0.63  --  0.65  CALCIUM 10.1  --  9.3  MG  --  2.2  --   PHOS  --  2.9  --    Liver Function Tests: Recent Labs  Lab 10/20/22 0656  AST 30  ALT 27  ALKPHOS 62  BILITOT 0.6  PROT 7.0  ALBUMIN 3.7   No results for input(s): "LIPASE", "AMYLASE" in the last 168 hours. No results for input(s): "AMMONIA" in the last 168 hours. Cardiac Enzymes: No results for input(s): "CKTOTAL", "CKMB", "CKMBINDEX", "TROPONINI" in the last 168 hours. BNP (last 3 results) Recent Labs    10/20/22 0656  BNP 18.1   CBG: No results for input(s): "GLUCAP" in the last 168 hours.  Time spent: 35 minutes  Signed:  Val Riles  [facility]  10/20/2022 2:38 PM

## 2022-10-20 NOTE — Progress Notes (Signed)
Discharge instructions reviewed. Patient verbalized understanding. No acute distress noted or verbalized at time of discharge. Patient discharged to home via personal vehicle.

## 2022-10-20 NOTE — Consult Note (Signed)
Cynthia Keith is a 76 y.o. female  [id number]  Primary Cardiologist: Neoma Laming Reason for Consultation: Chest pain  HPI: This is a 76 year old white female who presented to the urgent care with left shoulder and back and chest pain and ruled out for myocardial infarction.  I was asked to evaluate the patient prior to discharge.   Review of Systems: No further chest pain   Past Medical History:  Diagnosis Date   Actinic keratosis 03/15/2021   L post thigh    Basal cell carcinoma 06/21/2021   L alar crease, EDC done 07/31/21   GERD (gastroesophageal reflux disease)    Hyperlipidemia    Hypertension    Malignant essential hypertension 02/26/2017   Plantar fasciitis    Sleep apnea    Urinary tract infection     Medications Prior to Admission  Medication Sig Dispense Refill   amLODipine (NORVASC) 10 MG tablet TAKE 1 TABLET BY MOUTH EVERY DAY 90 tablet 1   aspirin EC 81 MG tablet Take 81 mg by mouth daily.     omeprazole (PRILOSEC) 20 MG capsule TAKE 1 CAPSULE BY MOUTH EVERY DAY 90 capsule 1   rosuvastatin (CRESTOR) 5 MG tablet TAKE 1 TABLET (5 MG TOTAL) BY MOUTH DAILY. 90 tablet 3   clobetasol cream (TEMOVATE) 4.03 % Apply 1 application topically 2 (two) times daily. Apply twice daily to spot treat affected areas as needed for itch. Avoid applying to face, groin, and axilla. Use as directed. Long-term use can cause thinning of the skin. (Patient not taking: Reported on 10/19/2022) 30 g 1   hydrochlorothiazide (HYDRODIURIL) 12.5 MG tablet TAKE 1 TABLET BY MOUTH EVERY DAY (Patient not taking: Reported on 10/19/2022) 90 tablet 3   Multiple Vitamin (MULTIVITAMIN) tablet Take 1 tablet by mouth daily. (Patient not taking: Reported on 10/19/2022)     sulfamethoxazole-trimethoprim (BACTRIM DS) 800-160 MG tablet Take 1 tablet by mouth 2 (two) times daily. (Patient not taking: Reported on 10/19/2022) 14 tablet 0      amLODipine  10 mg Oral Daily   aspirin EC  81 mg Oral Daily    lidocaine  1 patch Transdermal Q24H   potassium chloride  40 mEq Oral Q4H   rosuvastatin  5 mg Oral Daily   sodium chloride flush  3 mL Intravenous Q12H    Infusions:  sodium chloride 10 mL/hr at 10/20/22 4742    Allergies  Allergen Reactions   Iodinated Contrast Media Anaphylaxis    Remote hx of respiratory arrest after IV iodine for kidney study. Immediately treated w/o sequela. No previous or subsequent IVP exposure. Remote hx of respiratory arrest after IV iodine for kidney study. Immediately treated w/o sequela. No previous or subsequent IVP exposure. Remote hx of respiratory arrest after IV iodine for kidney study. Immediately treated w/o sequela. No previous or subsequent IVP exposure.   Iodine Shortness Of Breath    "Quit Breathing"    Social History   Socioeconomic History   Marital status: Married    Spouse name: Not on file   Number of children: Not on file   Years of education: Not on file   Highest education level: Not on file  Occupational History   Not on file  Tobacco Use   Smoking status: Never   Smokeless tobacco: Never  Vaping Use   Vaping Use: Never used  Substance and Sexual Activity   Alcohol use: Yes    Comment: ocassionally   Drug use: No   Sexual  activity: Yes    Birth control/protection: Post-menopausal, Surgical  Other Topics Concern   Not on file  Social History Narrative   Not on file   Social Determinants of Health   Financial Resource Strain: Not on file  Food Insecurity: No Food Insecurity (10/20/2022)   Hunger Vital Sign    Worried About Running Out of Food in the Last Year: Never true    Ran Out of Food in the Last Year: Never true  Transportation Needs: No Transportation Needs (10/20/2022)   PRAPARE - Hydrologist (Medical): No    Lack of Transportation (Non-Medical): No  Physical Activity: Not on file  Stress: Not on file  Social Connections: Not on file  Intimate Partner Violence: Not At Risk  (10/20/2022)   Humiliation, Afraid, Rape, and Kick questionnaire    Fear of Current or Ex-Partner: No    Emotionally Abused: No    Physically Abused: No    Sexually Abused: No    Family History  Problem Relation Age of Onset   Breast cancer Cousin    Stroke Mother    Heart disease Paternal Grandmother    Cancer Paternal Grandfather    Stroke Sister    Stroke Brother     PHYSICAL EXAM: Vitals:   10/20/22 0605 10/20/22 1012  BP: (!) 168/73 (!) 153/70  Pulse: 63   Resp: 18 18  Temp: 97.7 F (36.5 C) 98.2 F (36.8 C)  SpO2: 98%     No intake or output data in the 24 hours ending 10/20/22 1145  General:  Well appearing. No respiratory difficulty HEENT: normal Neck: supple. no JVD. Carotids 2+ bilat; no bruits. No lymphadenopathy or thryomegaly appreciated. Cor: PMI nondisplaced. Regular rate & rhythm. No rubs, gallops or murmurs. Lungs: clear Abdomen: soft, nontender, nondistended. No hepatosplenomegaly. No bruits or masses. Good bowel sounds. Extremities: no cyanosis, clubbing, rash, edema Neuro: alert & oriented x 3, cranial nerves grossly intact. moves all 4 extremities w/o difficulty. Affect pleasant.  ECG: Normal sinus rhythm no acute changes  Results for orders placed or performed during the hospital encounter of 10/19/22 (from the past 24 hour(s))  Basic metabolic panel     Status: Abnormal   Collection Time: 10/19/22  2:13 PM  Result Value Ref Range   Sodium 138 135 - 145 mmol/L   Potassium 3.4 (L) 3.5 - 5.1 mmol/L   Chloride 104 98 - 111 mmol/L   CO2 24 22 - 32 mmol/L   Glucose, Bld 100 (H) 70 - 99 mg/dL   BUN 14 8 - 23 mg/dL   Creatinine, Ser 0.63 0.44 - 1.00 mg/dL   Calcium 10.1 8.9 - 10.3 mg/dL   GFR, Estimated >60 >60 mL/min   Anion gap 10 5 - 15  CBC     Status: Abnormal   Collection Time: 10/19/22  2:13 PM  Result Value Ref Range   WBC 10.7 (H) 4.0 - 10.5 K/uL   RBC 4.62 3.87 - 5.11 MIL/uL   Hemoglobin 14.7 12.0 - 15.0 g/dL   HCT 42.4 36.0 -  46.0 %   MCV 91.8 80.0 - 100.0 fL   MCH 31.8 26.0 - 34.0 pg   MCHC 34.7 30.0 - 36.0 g/dL   RDW 12.8 11.5 - 15.5 %   Platelets 251 150 - 400 K/uL   nRBC 0.0 0.0 - 0.2 %  Troponin I (High Sensitivity)     Status: None   Collection Time: 10/19/22  2:13 PM  Result Value Ref Range   Troponin I (High Sensitivity) 4 <18 ng/L  D-dimer, quantitative     Status: Abnormal   Collection Time: 10/19/22  4:05 PM  Result Value Ref Range   D-Dimer, Quant 0.97 (H) 0.00 - 0.50 ug/mL-FEU  Troponin I (High Sensitivity)     Status: None   Collection Time: 10/19/22  7:26 PM  Result Value Ref Range   Troponin I (High Sensitivity) 6 <18 ng/L  Magnesium     Status: None   Collection Time: 10/20/22  6:50 AM  Result Value Ref Range   Magnesium 2.2 1.7 - 2.4 mg/dL  Phosphorus     Status: None   Collection Time: 10/20/22  6:50 AM  Result Value Ref Range   Phosphorus 2.9 2.5 - 4.6 mg/dL  Comprehensive metabolic panel     Status: Abnormal   Collection Time: 10/20/22  6:56 AM  Result Value Ref Range   Sodium 139 135 - 145 mmol/L   Potassium 3.2 (L) 3.5 - 5.1 mmol/L   Chloride 107 98 - 111 mmol/L   CO2 26 22 - 32 mmol/L   Glucose, Bld 107 (H) 70 - 99 mg/dL   BUN 13 8 - 23 mg/dL   Creatinine, Ser 0.65 0.44 - 1.00 mg/dL   Calcium 9.3 8.9 - 10.3 mg/dL   Total Protein 7.0 6.5 - 8.1 g/dL   Albumin 3.7 3.5 - 5.0 g/dL   AST 30 15 - 41 U/L   ALT 27 0 - 44 U/L   Alkaline Phosphatase 62 38 - 126 U/L   Total Bilirubin 0.6 0.3 - 1.2 mg/dL   GFR, Estimated >60 >60 mL/min   Anion gap 6 5 - 15  CBC     Status: None   Collection Time: 10/20/22  6:56 AM  Result Value Ref Range   WBC 6.5 4.0 - 10.5 K/uL   RBC 4.27 3.87 - 5.11 MIL/uL   Hemoglobin 13.4 12.0 - 15.0 g/dL   HCT 39.5 36.0 - 46.0 %   MCV 92.5 80.0 - 100.0 fL   MCH 31.4 26.0 - 34.0 pg   MCHC 33.9 30.0 - 36.0 g/dL   RDW 12.8 11.5 - 15.5 %   Platelets 215 150 - 400 K/uL   nRBC 0.0 0.0 - 0.2 %  T4, free     Status: None   Collection Time: 10/20/22   6:56 AM  Result Value Ref Range   Free T4 0.88 0.61 - 1.12 ng/dL  TSH     Status: None   Collection Time: 10/20/22  6:56 AM  Result Value Ref Range   TSH 2.133 0.350 - 4.500 uIU/mL  Brain natriuretic peptide     Status: None   Collection Time: 10/20/22  6:56 AM  Result Value Ref Range   B Natriuretic Peptide 18.1 0.0 - 100.0 pg/mL  Lipid panel     Status: Abnormal   Collection Time: 10/20/22  8:20 AM  Result Value Ref Range   Cholesterol 176 0 - 200 mg/dL   Triglycerides 87 <150 mg/dL   HDL 50 >40 mg/dL   Total CHOL/HDL Ratio 3.5 RATIO   VLDL 17 0 - 40 mg/dL   LDL Cholesterol 109 (H) 0 - 99 mg/dL   NM Pulmonary Perfusion  Result Date: 10/20/2022 CLINICAL DATA:  Elevated D-dimer level. CT IV contrast allergy. Left shoulder and posterior back pain radiating to the neck. EXAM: NUCLEAR MEDICINE PERFUSION LUNG SCAN TECHNIQUE: Perfusion images were obtained in multiple projections after intravenous  injection of radiopharmaceutical. Ventilation scans intentionally deferred if perfusion scan and chest x-ray adequate for interpretation during COVID 19 epidemic. RADIOPHARMACEUTICALS:  4.4 mCi Tc-25mMAA IV COMPARISON:  Chest radiograph 10/19/2022 FINDINGS: Expected pulmonary contour and appearance with no perfusion defects identified to indicate pulmonary embolus. IMPRESSION: 1. Normal perfusion lung scan, without perfusion defect to indicate pulmonary embolus. Electronically Signed   By: WVan ClinesM.D.   On: 10/20/2022 10:04   UKoreaVenous Img Lower Bilateral  Result Date: 10/19/2022 CLINICAL DATA:  Elevated D-dimer. EXAM: BILATERAL LOWER EXTREMITY VENOUS DOPPLER ULTRASOUND TECHNIQUE: Gray-scale sonography with graded compression, as well as color Doppler and duplex ultrasound were performed to evaluate the lower extremity deep venous systems from the level of the common femoral vein and including the common femoral, femoral, profunda femoral, popliteal and calf veins including the  posterior tibial, peroneal and gastrocnemius veins when visible. The superficial great saphenous vein was also interrogated. Spectral Doppler was utilized to evaluate flow at rest and with distal augmentation maneuvers in the common femoral, femoral and popliteal veins. COMPARISON:  None Available. FINDINGS: RIGHT LOWER EXTREMITY Common Femoral Vein: No evidence of thrombus. Normal compressibility, respiratory phasicity and response to augmentation. Saphenofemoral Junction: No evidence of thrombus. Normal compressibility and flow on color Doppler imaging. Profunda Femoral Vein: No evidence of thrombus. Normal compressibility and flow on color Doppler imaging. Femoral Vein: No evidence of thrombus. Normal compressibility, respiratory phasicity and response to augmentation. Popliteal Vein: No evidence of thrombus. Normal compressibility, respiratory phasicity and response to augmentation. Calf Veins: No evidence of thrombus. Normal compressibility and flow on color Doppler imaging. Superficial Great Saphenous Vein: No evidence of thrombus. Normal compressibility. Venous Reflux:  None. Other Findings:  None. LEFT LOWER EXTREMITY Common Femoral Vein: No evidence of thrombus. Normal compressibility, respiratory phasicity and response to augmentation. Saphenofemoral Junction: No evidence of thrombus. Normal compressibility and flow on color Doppler imaging. Profunda Femoral Vein: No evidence of thrombus. Normal compressibility and flow on color Doppler imaging. Femoral Vein: No evidence of thrombus. Normal compressibility, respiratory phasicity and response to augmentation. Popliteal Vein: No evidence of thrombus. Normal compressibility, respiratory phasicity and response to augmentation. Calf Veins: No evidence of thrombus. Normal compressibility and flow on color Doppler imaging. Superficial Great Saphenous Vein: No evidence of thrombus. Normal compressibility. Venous Reflux:  None. Other Findings:  None. IMPRESSION: No  evidence of deep venous thrombosis in either lower extremity. Electronically Signed   By: TVirgina NorfolkM.D.   On: 10/19/2022 19:15   DG Chest 2 View  Result Date: 10/19/2022 CLINICAL DATA:  Back pain radiating to the chest. EXAM: CHEST - 2 VIEW COMPARISON:  Chest radiograph 11/06/2021 FINDINGS: The cardiomediastinal silhouette is normal. There is no focal consolidation or pulmonary edema. There is no pleural effusion or pneumothorax There is no acute osseous abnormality. IMPRESSION: No radiographic evidence of acute cardiopulmonary process. Electronically Signed   By: PValetta MoleM.D.   On: 10/19/2022 14:52     ASSESSMENT AND PLAN: Chest pain with no acute EKG changes and negative troponin.  Patient can be discharged with follow-up in the office on Monday this week at 1 PM.  Viktoria Gruetzmacher A

## 2022-10-22 ENCOUNTER — Telehealth: Payer: Self-pay | Admitting: Physician Assistant

## 2022-10-22 ENCOUNTER — Ambulatory Visit: Payer: Medicare HMO | Admitting: Physician Assistant

## 2022-10-22 DIAGNOSIS — K219 Gastro-esophageal reflux disease without esophagitis: Secondary | ICD-10-CM | POA: Diagnosis not present

## 2022-10-22 DIAGNOSIS — G4730 Sleep apnea, unspecified: Secondary | ICD-10-CM | POA: Diagnosis not present

## 2022-10-22 DIAGNOSIS — I340 Nonrheumatic mitral (valve) insufficiency: Secondary | ICD-10-CM | POA: Diagnosis not present

## 2022-10-22 DIAGNOSIS — I10 Essential (primary) hypertension: Secondary | ICD-10-CM | POA: Diagnosis not present

## 2022-10-22 DIAGNOSIS — R079 Chest pain, unspecified: Secondary | ICD-10-CM | POA: Diagnosis not present

## 2022-10-22 LAB — HIGH SENSITIVITY CRP: CRP, High Sensitivity: 4.22 mg/L — ABNORMAL HIGH (ref 0.00–3.00)

## 2022-10-22 NOTE — Telephone Encounter (Signed)
Called patient to schedule hospital f/u for chest pain. Patient stated she has follow up with cardiologist-Toni

## 2022-11-05 ENCOUNTER — Ambulatory Visit: Payer: Medicare HMO | Admitting: Physician Assistant

## 2022-11-07 DIAGNOSIS — R079 Chest pain, unspecified: Secondary | ICD-10-CM | POA: Diagnosis not present

## 2022-11-12 DIAGNOSIS — E669 Obesity, unspecified: Secondary | ICD-10-CM | POA: Diagnosis not present

## 2022-11-12 DIAGNOSIS — I340 Nonrheumatic mitral (valve) insufficiency: Secondary | ICD-10-CM | POA: Diagnosis not present

## 2022-11-12 DIAGNOSIS — K219 Gastro-esophageal reflux disease without esophagitis: Secondary | ICD-10-CM | POA: Diagnosis not present

## 2022-11-12 DIAGNOSIS — G4730 Sleep apnea, unspecified: Secondary | ICD-10-CM | POA: Diagnosis not present

## 2022-11-12 DIAGNOSIS — I10 Essential (primary) hypertension: Secondary | ICD-10-CM | POA: Diagnosis not present

## 2022-11-21 ENCOUNTER — Other Ambulatory Visit: Payer: Self-pay | Admitting: Physician Assistant

## 2022-11-26 ENCOUNTER — Encounter: Payer: Self-pay | Admitting: Physician Assistant

## 2022-11-26 ENCOUNTER — Ambulatory Visit (INDEPENDENT_AMBULATORY_CARE_PROVIDER_SITE_OTHER): Payer: Medicare HMO | Admitting: Physician Assistant

## 2022-11-26 VITALS — BP 158/70 | HR 66 | Temp 98.0°F | Resp 16 | Ht <= 58 in | Wt 146.8 lb

## 2022-11-26 DIAGNOSIS — R300 Dysuria: Secondary | ICD-10-CM | POA: Diagnosis not present

## 2022-11-26 DIAGNOSIS — I10 Essential (primary) hypertension: Secondary | ICD-10-CM | POA: Diagnosis not present

## 2022-11-26 DIAGNOSIS — E876 Hypokalemia: Secondary | ICD-10-CM

## 2022-11-26 DIAGNOSIS — N390 Urinary tract infection, site not specified: Secondary | ICD-10-CM

## 2022-11-26 LAB — POCT URINALYSIS DIPSTICK
Bilirubin, UA: NEGATIVE
Blood, UA: NEGATIVE
Glucose, UA: NEGATIVE
Ketones, UA: NEGATIVE
Nitrite, UA: NEGATIVE
Protein, UA: NEGATIVE
Spec Grav, UA: 1.01 (ref 1.010–1.025)
Urobilinogen, UA: 0.2 E.U./dL
pH, UA: 6.5 (ref 5.0–8.0)

## 2022-11-26 MED ORDER — LISINOPRIL 5 MG PO TABS: 5.0000 mg | ORAL_TABLET | Freq: Every day | ORAL | 3 refills | Status: DC

## 2022-11-26 NOTE — Progress Notes (Signed)
[facility] [place], [place] [address]  Internal MEDICINE  Office Visit Note  Patient Name: Cynthia Keith  [id number]  [id number]  Date of Service: 12/04/2022  Chief Complaint  Patient presents with   Follow-up   Gastroesophageal Reflux   Hypertension   Hyperlipidemia   Quality Metric Gaps    Flu and TDAP   Memory Loss    Patient completed the Mini Mental Exam today because she is having some memory loss. Memory loss seems to be more related to loss of orientation (can not remember how to get to certain stores).    HPI Pt is here for routine follow up -Some memory concerns recently. States it is more so remembering how to get places she doesn't go regularly. She is fine with places in her normal routine. MMSE normal at 28/30. -Her mother did have dementia. Did discuss neurology referral if interested, but wants to hold off for now. -went to ED on 10/27 with atypical chest pain. She has been taking her amlodipine and HCTZ. Thinks it was actually a muscle strain.  -potassium was low in hospital, which is why hctz was suuposed to be stopped but she didn't stop this, need to adjust meds and will add lisinopril to help with potassium and BP and recheck labs -Did have a cardiology visit with Dr. Humphrey Rolls and no need for stress testing or further evaluation -Does get a little swelling in ankles with standing all day.  -She did not keep log of BP and advised to do so now -Some odor to urine and would like to check it given her hx of frequent UTIs. No burning or urgency.  Current Medication: Outpatient Encounter Medications as of 11/26/2022  Medication Sig   amLODipine (NORVASC) 10 MG tablet TAKE 1 TABLET BY MOUTH EVERY DAY   aspirin EC 81 MG tablet Take 81 mg by mouth daily.   lisinopril (ZESTRIL) 5 MG tablet Take 1 tablet (5 mg total) by mouth daily.   omeprazole (PRILOSEC) 20 MG capsule TAKE 1 CAPSULE BY MOUTH EVERY DAY   rosuvastatin (CRESTOR) 5 MG tablet TAKE  1 TABLET (5 MG TOTAL) BY MOUTH DAILY.   No facility-administered encounter medications on file as of 11/26/2022.    Surgical History: Past Surgical History:  Procedure Laterality Date   ABDOMINAL HYSTERECTOMY     COLONOSCOPY WITH PROPOFOL N/A 12/12/2015   Procedure: COLONOSCOPY WITH PROPOFOL;  Surgeon: Manya Silvas, MD;  Location: [facility] ENDOSCOPY;  Service: Endoscopy;  Laterality: N/A;   HAND SURGERY  2005    Medical History: Past Medical History:  Diagnosis Date   Actinic keratosis 03/15/2021   L post thigh    Basal cell carcinoma 06/21/2021   L alar crease, EDC done 07/31/21   GERD (gastroesophageal reflux disease)    Hyperlipidemia    Hypertension    Malignant essential hypertension 02/26/2017   Plantar fasciitis    Sleep apnea    Urinary tract infection     Family History: Family History  Problem Relation Age of Onset   Breast cancer Cousin    Stroke Mother    Heart disease Paternal Grandmother    Cancer Paternal Grandfather    Stroke Sister    Stroke Brother     Social History   Socioeconomic History   Marital status: Married    Spouse name: Not on file   Number of children: Not on file   Years of education: Not on file   Highest education level:  Not on file  Occupational History   Not on file  Tobacco Use   Smoking status: Never   Smokeless tobacco: Never  Vaping Use   Vaping Use: Never used  Substance and Sexual Activity   Alcohol use: Yes    Comment: ocassionally   Drug use: No   Sexual activity: Yes    Birth control/protection: Post-menopausal, Surgical  Other Topics Concern   Not on file  Social History Narrative   Not on file   Social Determinants of Health   Financial Resource Strain: Not on file  Food Insecurity: No Food Insecurity (10/20/2022)   Hunger Vital Sign    Worried About Running Out of Food in the Last Year: Never true    Ran Out of Food in the Last Year: Never true  Transportation Needs: No Transportation Needs  (10/20/2022)   PRAPARE - Hydrologist (Medical): No    Lack of Transportation (Non-Medical): No  Physical Activity: Not on file  Stress: Not on file  Social Connections: Not on file  Intimate Partner Violence: Not At Risk (10/20/2022)   Humiliation, Afraid, Rape, and Kick questionnaire    Fear of Current or Ex-Partner: No    Emotionally Abused: No    Physically Abused: No    Sexually Abused: No      Review of Systems  Constitutional:  Negative for chills, fatigue and unexpected weight change.  HENT:  Negative for congestion, postnasal drip, rhinorrhea, sneezing and sore throat.   Eyes:  Negative for redness.  Respiratory:  Negative for cough, chest tightness and shortness of breath.   Cardiovascular:  Negative for chest pain and palpitations.  Gastrointestinal:  Negative for abdominal pain, constipation, diarrhea, nausea and vomiting.  Genitourinary:  Negative for dysuria and frequency.  Musculoskeletal:  Negative for arthralgias, back pain, joint swelling and neck pain.  Skin:  Negative for rash.  Neurological: Negative.  Negative for tremors and numbness.  Hematological:  Negative for adenopathy. Does not bruise/bleed easily.  Psychiatric/Behavioral:  Negative for behavioral problems (Depression), sleep disturbance and suicidal ideas.     Vital Signs: BP (!) 158/70 Comment: 178/77  Pulse 66   Temp 98 F (36.7 C)   Resp 16   Ht '4\' 8"'$  (1.422 m)   Wt 146 lb 12.8 oz (66.6 kg)   SpO2 99%   BMI 32.91 kg/m    Physical Exam Vitals and nursing note reviewed.  Constitutional:      General: She is not in acute distress.    Appearance: Normal appearance. She is well-developed. She is obese. She is not diaphoretic.  HENT:     Head: Normocephalic and atraumatic.     Mouth/Throat:     Pharynx: No oropharyngeal exudate.  Eyes:     Pupils: Pupils are equal, round, and reactive to light.  Neck:     Thyroid: No thyromegaly.     Vascular: No JVD.      Trachea: No tracheal deviation.  Cardiovascular:     Rate and Rhythm: Normal rate and regular rhythm.     Heart sounds: Normal heart sounds. No murmur heard.    No friction rub. No gallop.  Pulmonary:     Effort: Pulmonary effort is normal. No respiratory distress.     Breath sounds: No wheezing or rales.  Chest:     Chest wall: No tenderness.  Abdominal:     General: Bowel sounds are normal.     Palpations: Abdomen is soft.  Musculoskeletal:  General: Normal range of motion.     Cervical back: Normal range of motion and neck supple.  Lymphadenopathy:     Cervical: No cervical adenopathy.  Skin:    General: Skin is warm and dry.  Neurological:     Mental Status: She is alert and oriented to person, place, and time.     Cranial Nerves: No cranial nerve deficit.  Psychiatric:        Behavior: Behavior normal.        Thought Content: Thought content normal.        Judgment: Judgment normal.        Assessment/Plan: 1. Essential hypertension Will add lisinopril to help control BP and to help stablize potassium while continuing amlodipine and HCTZ as before. Advised to monitor BP closely and keep log - lisinopril (ZESTRIL) 5 MG tablet; Take 1 tablet (5 mg total) by mouth daily.  Dispense: 90 tablet; Refill: 3  2. Hypokalemia Will recheck labs after starting on lisinopril - Comprehensive metabolic panel  3. Dysuria - POCT Urinalysis Dipstick  4. Urinary tract infection without hematuria, site unspecified - CULTURE, URINE COMPREHENSIVE    General Counseling: Yolunda verbalizes understanding of the findings of todays visit and agrees with plan of treatment. I have discussed any further diagnostic evaluation that may be needed or ordered today. We also reviewed her medications today. she has been encouraged to call the office with any questions or concerns that should arise related to todays visit.    Orders Placed This Encounter  Procedures   CULTURE, URINE  COMPREHENSIVE   Comprehensive metabolic panel   POCT Urinalysis Dipstick    Meds ordered this encounter  Medications   lisinopril (ZESTRIL) 5 MG tablet    Sig: Take 1 tablet (5 mg total) by mouth daily.    Dispense:  90 tablet    Refill:  3    This patient was seen by Drema Dallas, PA-C in collaboration with Dr. Clayborn Bigness as a part of collaborative care agreement.   Total time spent:30 Minutes Time spent includes review of chart, medications, test results, and follow up plan with the patient.      Dr Lavera Guise Internal medicine

## 2022-11-27 ENCOUNTER — Telehealth: Payer: Self-pay

## 2022-11-27 NOTE — Telephone Encounter (Signed)
Pt called that she was having diarrhea and throwing no fever advised her that bland diet and drink plenty of water and also advised her if had fever or any other symptoms call us back and do covid test

## 2022-11-28 LAB — CULTURE, URINE COMPREHENSIVE

## 2022-11-30 ENCOUNTER — Telehealth: Payer: Self-pay

## 2022-11-30 NOTE — Telephone Encounter (Signed)
-----   Message from Mylinda Latina, PA-C sent at 11/30/2022  1:25 PM EST ----- Let her know that her urine just showed urogenital flora so no need for ABX

## 2022-11-30 NOTE — Telephone Encounter (Signed)
Spoke with patient regarding urine results.

## 2022-12-25 ENCOUNTER — Telehealth: Payer: Medicare HMO | Admitting: Nurse Practitioner

## 2022-12-25 ENCOUNTER — Encounter: Payer: Self-pay | Admitting: Nurse Practitioner

## 2022-12-25 VITALS — Ht <= 58 in | Wt 143.0 lb

## 2022-12-25 DIAGNOSIS — J018 Other acute sinusitis: Secondary | ICD-10-CM

## 2022-12-25 MED ORDER — AZITHROMYCIN 250 MG PO TABS: ORAL_TABLET | ORAL | 0 refills | Status: AC

## 2022-12-25 NOTE — Progress Notes (Signed)
[facility] [place], [place] [address]  Internal MEDICINE  Telephone Visit  Patient Name: Cynthia Keith  [id number]  [id number]  Date of Service: 12/25/2022  I connected with the patient at 1555 by telephone and verified the patients identity using two identifiers.   I discussed the limitations, risks, security and privacy concerns of performing an evaluation and management service by telephone and the availability of in person appointments. I also discussed with the patient that there may be a patient responsible charge related to the service.  The patient expressed understanding and agrees to proceed.    Chief Complaint  Patient presents with   Telephone Assessment    [id number]   Telephone Screen    Covid test is negative    Sinusitis   Cough    Going on  1 weeks     HPI Cynthia Keith presents for a telehealth virtual visit for symptoms of sinusitis. Onset of symptoms was 1 week ago.  Reports cough, and hoarseness Covid test is negative.    Current Medication: Outpatient Encounter Medications as of 12/25/2022  Medication Sig   amLODipine (NORVASC) 10 MG tablet TAKE 1 TABLET BY MOUTH EVERY DAY   aspirin EC 81 MG tablet Take 81 mg by mouth daily.   azithromycin (ZITHROMAX) 250 MG tablet Take 2 tablets on day 1, then 1 tablet daily on days 2 through 5   lisinopril (ZESTRIL) 5 MG tablet Take 1 tablet (5 mg total) by mouth daily.   omeprazole (PRILOSEC) 20 MG capsule TAKE 1 CAPSULE BY MOUTH EVERY DAY   rosuvastatin (CRESTOR) 5 MG tablet TAKE 1 TABLET (5 MG TOTAL) BY MOUTH DAILY.   No facility-administered encounter medications on file as of 12/25/2022.    Surgical History: Past Surgical History:  Procedure Laterality Date   ABDOMINAL HYSTERECTOMY     COLONOSCOPY WITH PROPOFOL N/A 12/12/2015   Procedure: COLONOSCOPY WITH PROPOFOL;  Surgeon: Manya Silvas, MD;  Location: [facility] ENDOSCOPY;  Service: Endoscopy;  Laterality: N/A;   HAND SURGERY  2005     Medical History: Past Medical History:  Diagnosis Date   Actinic keratosis 03/15/2021   L post thigh    Basal cell carcinoma 06/21/2021   L alar crease, EDC done 07/31/21   GERD (gastroesophageal reflux disease)    Hyperlipidemia    Hypertension    Malignant essential hypertension 02/26/2017   Plantar fasciitis    Sleep apnea    Urinary tract infection     Family History: Family History  Problem Relation Age of Onset   Breast cancer Cousin    Stroke Mother    Heart disease Paternal Grandmother    Cancer Paternal Grandfather    Stroke Sister    Stroke Brother     Social History   Socioeconomic History   Marital status: Married    Spouse name: Not on file   Number of children: Not on file   Years of education: Not on file   Highest education level: Not on file  Occupational History   Not on file  Tobacco Use   Smoking status: Never   Smokeless tobacco: Never  Vaping Use   Vaping Use: Never used  Substance and Sexual Activity   Alcohol use: Yes    Comment: ocassionally   Drug use: No   Sexual activity: Yes    Birth control/protection: Post-menopausal, Surgical  Other Topics Concern   Not on file  Social History Narrative   Not on file  Social Determinants of Health   Financial Resource Strain: Not on file  Food Insecurity: No Food Insecurity (10/20/2022)   Hunger Vital Sign    Worried About Running Out of Food in the Last Year: Never true    Ran Out of Food in the Last Year: Never true  Transportation Needs: No Transportation Needs (10/20/2022)   PRAPARE - Hydrologist (Medical): No    Lack of Transportation (Non-Medical): No  Physical Activity: Not on file  Stress: Not on file  Social Connections: Not on file  Intimate Partner Violence: Not At Risk (10/20/2022)   Humiliation, Afraid, Rape, and Kick questionnaire    Fear of Current or Ex-Partner: No    Emotionally Abused: No    Physically Abused: No    Sexually  Abused: No      Review of Systems  Constitutional:  Negative for appetite change, chills, fatigue and unexpected weight change.  HENT:  Positive for congestion (green drainage), postnasal drip, rhinorrhea and voice change. Negative for sinus pressure, sinus pain, sneezing and sore throat.   Eyes:  Negative for redness.  Respiratory:  Positive for cough and chest tightness. Negative for shortness of breath and wheezing.   Cardiovascular: Negative.  Negative for chest pain and palpitations.  Gastrointestinal: Negative.  Negative for abdominal pain, constipation, diarrhea, nausea and vomiting.  Genitourinary:  Negative for dysuria and frequency.  Musculoskeletal:  Negative for arthralgias, back pain, joint swelling and neck pain.  Skin:  Negative for rash.  Neurological:  Negative for tremors, seizures, numbness and headaches.  Hematological:  Negative for adenopathy. Does not bruise/bleed easily.  Psychiatric/Behavioral:  Negative for behavioral problems (Depression), sleep disturbance and suicidal ideas. The patient is not nervous/anxious.     Vital Signs: Ht '4\' 8"'$  (1.422 m)   Wt 143 lb (64.9 kg)   BMI 32.06 kg/m    Observation/Objective: She is alert and oriented and engages in conversation appropriately. She does not sound as though she is in any acute distress over telephone call.    Assessment/Plan: 1. Acute non-recurrent sinusitis of other sinus Zpak prescribed. Call clinic if no improvement or worsening of symptoms after 5 days of antibiotic. - azithromycin (ZITHROMAX) 250 MG tablet; Take 2 tablets on day 1, then 1 tablet daily on days 2 through 5  Dispense: 6 tablet; Refill: 0   General Counseling: Cynthia Keith understanding of the findings of today's phone visit and agrees with plan of treatment. I have discussed any further diagnostic evaluation that may be needed or ordered today. We also reviewed her medications today. she has been encouraged to call the office  with any questions or concerns that should arise related to todays visit.  Return if symptoms worsen or fail to improve.   No orders of the defined types were placed in this encounter.   Meds ordered this encounter  Medications   azithromycin (ZITHROMAX) 250 MG tablet    Sig: Take 2 tablets on day 1, then 1 tablet daily on days 2 through 5    Dispense:  6 tablet    Refill:  0    Time spent:10 Minutes Time spent with patient included reviewing progress notes, labs, imaging studies, and discussing plan for follow up.   Controlled Substance Database was reviewed by me for overdose risk score (ORS) if appropriate.  This patient was seen by Jonetta Osgood, FNP-C in collaboration with Dr. Clayborn Bigness as a part of collaborative care agreement.  Sorah Falkenstein R. Valetta Fuller, MSN,  FNP-C Internal medicine

## 2022-12-31 ENCOUNTER — Ambulatory Visit: Payer: Medicare HMO | Admitting: Physician Assistant

## 2022-12-31 VITALS — BP 151/76 | HR 68 | Ht <= 58 in

## 2022-12-31 DIAGNOSIS — R300 Dysuria: Secondary | ICD-10-CM | POA: Diagnosis not present

## 2022-12-31 DIAGNOSIS — Z961 Presence of intraocular lens: Secondary | ICD-10-CM | POA: Diagnosis not present

## 2022-12-31 DIAGNOSIS — R350 Frequency of micturition: Secondary | ICD-10-CM | POA: Diagnosis not present

## 2022-12-31 DIAGNOSIS — N390 Urinary tract infection, site not specified: Secondary | ICD-10-CM | POA: Diagnosis not present

## 2022-12-31 DIAGNOSIS — R82998 Other abnormal findings in urine: Secondary | ICD-10-CM | POA: Diagnosis not present

## 2022-12-31 DIAGNOSIS — H18593 Other hereditary corneal dystrophies, bilateral: Secondary | ICD-10-CM | POA: Diagnosis not present

## 2022-12-31 LAB — URINALYSIS, COMPLETE
Bilirubin, UA: NEGATIVE
Glucose, UA: NEGATIVE
Ketones, UA: NEGATIVE
Nitrite, UA: NEGATIVE
Protein,UA: NEGATIVE
RBC, UA: NEGATIVE
Specific Gravity, UA: 1.015 (ref 1.005–1.030)
Urobilinogen, Ur: 0.2 mg/dL (ref 0.2–1.0)
pH, UA: 7.5 (ref 5.0–7.5)

## 2022-12-31 LAB — MICROSCOPIC EXAMINATION: WBC, UA: >30 /hpf — AB (ref 0–5)

## 2022-12-31 MED ORDER — ESTRADIOL 0.1 MG/GM VA CREA: TOPICAL_CREAM | VAGINAL | 12 refills | Status: DC

## 2022-12-31 MED ORDER — SULFAMETHOXAZOLE-TRIMETHOPRIM 800-160 MG PO TABS: 1.0000 | ORAL_TABLET | Freq: Two times a day (BID) | ORAL | 0 refills | Status: DC

## 2022-12-31 NOTE — Progress Notes (Signed)
12/31/2022 10:43 AM   [name] 04-Apr-1946 [id number]  CC: Chief Complaint  Patient presents with   Urinary Frequency   HPI: ISYSS ESPINAL is a 77 y.o. female with PMH pelvic organ prolapse s/p robotic colopexy, cystourethroscopy, and bladder sling with Dr. Salome Holmes at [facility] in 2016, right renal mass on surveillance, recurrent UTI on cranberry supplements and topical vaginal estrogen cream, and OAB wet who previously responded well on Myrbetriq 25 mg daily who presents today for evaluation of possible UTI.   Today she reports an approximate 2-week history of urinary frequency, dysuria, and malodorous urine.  She clarifies that it is not burning pain with voiding, but bladder pain especially with initiation.  She remains on cranberry supplements and is no longer using estrogen cream because she was not sure if she still needed it.  In-office UA today positive for 2+ leukocytes; urine microscopy with >30 WBCs/HPF and many bacteria.  PMH: Past Medical History:  Diagnosis Date   Actinic keratosis 03/15/2021   L post thigh    Basal cell carcinoma 06/21/2021   L alar crease, EDC done 07/31/21   GERD (gastroesophageal reflux disease)    Hyperlipidemia    Hypertension    Malignant essential hypertension 02/26/2017   Plantar fasciitis    Sleep apnea    Urinary tract infection     Surgical History: Past Surgical History:  Procedure Laterality Date   ABDOMINAL HYSTERECTOMY     COLONOSCOPY WITH PROPOFOL N/A 12/12/2015   Procedure: COLONOSCOPY WITH PROPOFOL;  Surgeon: Manya Silvas, MD;  Location: [place];  Service: Endoscopy;  Laterality: N/A;   HAND SURGERY  2005    Home Medications:  Allergies as of 12/31/2022       Reactions   Iodinated Contrast Media Anaphylaxis   Remote hx of respiratory arrest after IV iodine for kidney study. Immediately treated w/o sequela. No previous or subsequent IVP exposure. Remote hx of respiratory arrest after IV iodine for kidney study.  Immediately treated w/o sequela. No previous or subsequent IVP exposure. Remote hx of respiratory arrest after IV iodine for kidney study. Immediately treated w/o sequela. No previous or subsequent IVP exposure.   Iodine Shortness Of Breath   "Quit Breathing"        Medication List        Accurate as of December 31, 2022 10:43 AM. If you have any questions, ask your nurse or doctor.          amLODipine 10 MG tablet Commonly known as: NORVASC TAKE 1 TABLET BY MOUTH EVERY DAY   aspirin EC 81 MG tablet Take 81 mg by mouth daily.   HAIR SKIN & NAILS PO Take by mouth.   hydrochlorothiazide 12.5 MG tablet Commonly known as: HYDRODIURIL Take 12.5 mg by mouth daily.   lisinopril 5 MG tablet Commonly known as: ZESTRIL Take 1 tablet (5 mg total) by mouth daily.   omeprazole 20 MG capsule Commonly known as: PRILOSEC TAKE 1 CAPSULE BY MOUTH EVERY DAY   OVER THE COUNTER MEDICATION Doterra-EO Mega/AlphaCRS/MIcro Plex VM/Deep Blue Veg capsules   rosuvastatin 5 MG tablet Commonly known as: CRESTOR TAKE 1 TABLET (5 MG TOTAL) BY MOUTH DAILY.        Allergies:  Allergies  Allergen Reactions   Iodinated Contrast Media Anaphylaxis    Remote hx of respiratory arrest after IV iodine for kidney study. Immediately treated w/o sequela. No previous or subsequent IVP exposure. Remote hx of respiratory arrest after IV iodine for kidney  study. Immediately treated w/o sequela. No previous or subsequent IVP exposure. Remote hx of respiratory arrest after IV iodine for kidney study. Immediately treated w/o sequela. No previous or subsequent IVP exposure.   Iodine Shortness Of Breath    "Quit Breathing"    Family History: Family History  Problem Relation Age of Onset   Breast cancer Cousin    Stroke Mother    Heart disease Paternal Grandmother    Cancer Paternal Grandfather    Stroke Sister    Stroke Brother     Social History:   reports that she has never smoked. She has never  used smokeless tobacco. She reports current alcohol use. She reports that she does not use drugs.  Physical Exam: BP (!) 151/76   Pulse 68   Ht '4\' 8"'$  (1.422 m)   BMI 32.06 kg/m   Constitutional:  Alert and oriented, no acute distress, nontoxic appearing HEENT: [place], AT Cardiovascular: No clubbing, cyanosis, or edema Respiratory: Normal respiratory effort, no increased work of breathing Skin: No rashes, bruises or suspicious lesions Neurologic: Grossly intact, no focal deficits, moving all 4 extremities Psychiatric: Normal mood and affect  Laboratory Data: Results for orders placed or performed in visit on 12/31/22  Microscopic Examination   Urine  Result Value Ref Range   WBC, UA >30 (A) 0 - 5 /hpf   RBC, Urine 0-2 0 - 2 /hpf   Epithelial Cells (non renal) 0-10 0 - 10 /hpf   Bacteria, UA Many (A) None seen/Few  Urinalysis, Complete  Result Value Ref Range   Specific Gravity, UA 1.015 1.005 - 1.030   pH, UA 7.5 5.0 - 7.5   Color, UA Yellow Yellow   Appearance Ur Cloudy (A) Clear   Leukocytes,UA 3+ (A) Negative   Protein,UA Negative Negative/Trace   Glucose, UA Negative Negative   Ketones, UA Negative Negative   RBC, UA Negative Negative   Bilirubin, UA Negative Negative   Urobilinogen, Ur 0.2 0.2 - 1.0 mg/dL   Nitrite, UA Negative Negative   Microscopic Examination See below:    Assessment & Plan:   1. Recurrent UTI UA appears grossly infected today, will start empiric Bactrim and send for culture for further evaluation.  Given a prolonged period of persistent symptoms last year, will also obtain an atypical culture today.  I also encouraged her to resume vaginal estrogen cream as the cornerstone of UTI prevention in postmenopausal women.  We discussed resuming this with daily use x 2 weeks followed by 3 times weekly use indefinitely.  She expressed understanding. - Urinalysis, Complete - CULTURE, URINE COMPREHENSIVE - Mycoplasma / ureaplasma culture -  sulfamethoxazole-trimethoprim (BACTRIM DS) 800-160 MG tablet; Take 1 tablet by mouth 2 (two) times daily for 7 days.  Dispense: 14 tablet; Refill: 0 - estradiol (ESTRACE) 0.1 MG/GM vaginal cream; Apply one pea-sized amount around the opening of the urethra daily for 2 weeks, then 3 times weekly moving forward.  Dispense: 42.5 g; Refill: 12   Return for Will call with results.  Debroah Loop, PA-C  [facility] [address] [place], [place] [address] [phone]

## 2023-01-03 LAB — CULTURE, URINE COMPREHENSIVE

## 2023-01-04 ENCOUNTER — Telehealth: Payer: Self-pay | Admitting: *Deleted

## 2023-01-04 MED ORDER — NITROFURANTOIN MONOHYD MACRO 100 MG PO CAPS: 100.0000 mg | ORAL_CAPSULE | Freq: Two times a day (BID) | ORAL | 0 refills | Status: AC

## 2023-01-04 NOTE — Telephone Encounter (Signed)
-----  Message from Debroah Loop, [place] sent at 01/03/2023  4:47 PM EST ----- Please have her stop Bactrim and start Macrobid '100mg'$  BID x5 days instead.

## 2023-01-09 LAB — MYCOPLASMA / UREAPLASMA CULTURE
Mycoplasma hominis Culture: NEGATIVE
Ureaplasma urealyticum: NEGATIVE

## 2023-01-14 ENCOUNTER — Ambulatory Visit
Admission: RE | Admit: 2023-01-14 | Discharge: 2023-01-14 | Disposition: A | Discharge status: Home / Self Care | Payer: Medicare HMO | Source: Ambulatory Visit | Attending: Urology | Admitting: Urology

## 2023-01-14 ENCOUNTER — Ambulatory Visit: Payer: Medicare HMO

## 2023-01-14 DIAGNOSIS — N281 Cyst of kidney, acquired: Secondary | ICD-10-CM | POA: Diagnosis not present

## 2023-01-14 DIAGNOSIS — N2889 Other specified disorders of kidney and ureter: Secondary | ICD-10-CM | POA: Insufficient documentation

## 2023-01-16 ENCOUNTER — Emergency Department
Admission: EM | Admit: 2023-01-16 | Discharge: 2023-01-16 | Disposition: A | Discharge status: Home / Self Care | Payer: Medicare HMO | Attending: Emergency Medicine | Admitting: Emergency Medicine

## 2023-01-16 ENCOUNTER — Other Ambulatory Visit: Payer: Self-pay

## 2023-01-16 DIAGNOSIS — R55 Syncope and collapse: Secondary | ICD-10-CM | POA: Diagnosis not present

## 2023-01-16 DIAGNOSIS — R42 Dizziness and giddiness: Secondary | ICD-10-CM | POA: Diagnosis not present

## 2023-01-16 DIAGNOSIS — R0902 Hypoxemia: Secondary | ICD-10-CM | POA: Diagnosis not present

## 2023-01-16 DIAGNOSIS — I10 Essential (primary) hypertension: Secondary | ICD-10-CM | POA: Insufficient documentation

## 2023-01-16 LAB — BASIC METABOLIC PANEL
Anion gap: 9 (ref 5–15)
BUN: 9 mg/dL (ref 8–23)
CO2: 20 mmol/L — ABNORMAL LOW (ref 22–32)
Calcium: 8.3 mg/dL — ABNORMAL LOW (ref 8.9–10.3)
Chloride: 108 mmol/L (ref 98–111)
Creatinine, Ser: 0.56 mg/dL (ref 0.44–1.00)
GFR, Estimated: >60 mL/min (ref >60)
Glucose, Bld: 82 mg/dL (ref 70–99)
Potassium: 2.8 mmol/L — ABNORMAL LOW (ref 3.5–5.1)
Sodium: 137 mmol/L (ref 135–145)

## 2023-01-16 LAB — CBG MONITORING, ED: Glucose-Capillary: 99 mg/dL (ref 70–99)

## 2023-01-16 LAB — URINALYSIS, ROUTINE W REFLEX MICROSCOPIC
Bilirubin Urine: NEGATIVE
Glucose, UA: NEGATIVE mg/dL
Hgb urine dipstick: NEGATIVE
Ketones, ur: NEGATIVE mg/dL
Leukocytes,Ua: NEGATIVE
Nitrite: NEGATIVE
Protein, ur: NEGATIVE mg/dL
Specific Gravity, Urine: 1.003 — ABNORMAL LOW (ref 1.005–1.030)
pH: 8 (ref 5.0–8.0)

## 2023-01-16 LAB — CBC
HCT: 40.1 % (ref 36.0–46.0)
Hemoglobin: 14 g/dL (ref 12.0–15.0)
MCH: 32 pg (ref 26.0–34.0)
MCHC: 34.9 g/dL (ref 30.0–36.0)
MCV: 91.6 fL (ref 80.0–100.0)
Platelets: 252 10*3/uL (ref 150–400)
RBC: 4.38 MIL/uL (ref 3.87–5.11)
RDW: 12.7 % (ref 11.5–15.5)
WBC: 7.7 10*3/uL (ref 4.0–10.5)
nRBC: 0 % (ref 0.0–0.2)

## 2023-01-16 LAB — TROPONIN I (HIGH SENSITIVITY)
Troponin I (High Sensitivity): 3 ng/L (ref <18)
Troponin I (High Sensitivity): 3 ng/L (ref <18)

## 2023-01-16 MED ORDER — POTASSIUM CHLORIDE ER 10 MEQ PO TBCR: 10.0000 meq | EXTENDED_RELEASE_TABLET | Freq: Every day | ORAL | 0 refills | Status: DC

## 2023-01-16 MED ORDER — ONDANSETRON HCL 4 MG/2ML IJ SOLN
4.0000 mg | Freq: Once | INTRAMUSCULAR | Status: AC
  Administered 2023-01-16: 4 mg via INTRAVENOUS
  Filled 2023-01-16: qty 2

## 2023-01-16 MED ORDER — SODIUM CHLORIDE 0.9 % IV BOLUS
500.0000 mL | Freq: Once | INTRAVENOUS | Status: AC
  Administered 2023-01-16: 500 mL via INTRAVENOUS

## 2023-01-16 MED ORDER — ONDANSETRON 4 MG PO TBDP: 4.0000 mg | ORAL_TABLET | Freq: Three times a day (TID) | ORAL | 0 refills | Status: DC | PRN

## 2023-01-16 MED ORDER — POTASSIUM CHLORIDE CRYS ER 20 MEQ PO TBCR
60.0000 meq | EXTENDED_RELEASE_TABLET | Freq: Once | ORAL | Status: AC
  Administered 2023-01-16: 60 meq via ORAL
  Filled 2023-01-16: qty 3

## 2023-01-16 NOTE — ED Triage Notes (Signed)
Pt states she was at work and nearly hd a syncopal episode. She states she believed her bp may have been off. Upon ems arrival, bp was 190/80, hx htn but states she has never had an episode like this. Pt states she takes amlodipine and another bp med  No meds en route  Pt a&0x4 upon arrival, resp even and unlab, pt states she feels a little "woozy"

## 2023-01-16 NOTE — ED Provider Notes (Signed)
[facility] Provider Note    Event Date/Time   First MD Initiated Contact with Patient 01/16/23 1213     (approximate)   History   Chief Complaint: Dizziness  HPI  Cynthia Keith is a 77 y.o. 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  She had just done a weight check at her weight management program, when she started to feel like she was lightheaded.  She associates this with elevated blood pressure, so she walked around the area to other stores to see if anyone had a blood pressure cuff.  Another person assisted in calling EMS to evaluate the patient.  She denies any associated pain.  No chest pain shortness of breath or abdominal pain.  No acute headache, paresthesias, motor weakness, or vision changes.  No recent fever vomiting or diarrhea.  Normal oral intake.  Compliant with medications including amlodipine, HCTZ 12.5, lisinopril 5 mg     Physical Exam   Triage Vital Signs: ED Triage Vitals  Enc Vitals Group     BP 01/16/23 1117 (!) 161/84     Pulse Rate 01/16/23 1117 66     Resp 01/16/23 1117 17     Temp 01/16/23 1117 98.4 F (36.9 C)     Temp Source 01/16/23 1117 Oral     SpO2 01/16/23 1117 100 %     Weight 01/16/23 1113 141 lb (64 kg)     Height 01/16/23 1113 '4\' 8"'$  (1.422 m)     Head Circumference --      Peak Flow --      Pain Score 01/16/23 1113 0     Pain Loc --      Pain Edu? --      Excl. in [place]? --     Most recent vital signs: Vitals:   01/16/23 1328 01/16/23 1442  BP: (!) 151/66 132/60  Pulse: (!) 59 64  Resp: 15 19  Temp:  98.5 F (36.9 C)  SpO2: 99% 99%    General: Awake, no distress.  CV:  Good peripheral perfusion.  Regular rate rhythm Resp:  Normal effort.  Clear to auscultation bilaterally Abd:  No distention.  Soft nontender Other:  Moist oral mucosa.  Cranial nerves III through  XII intact   ED Results / Procedures / Treatments   Labs (all labs ordered are listed, but only abnormal results are displayed) Labs Reviewed  URINALYSIS, ROUTINE W REFLEX MICROSCOPIC - Abnormal; Notable for the following components:      Result Value   Color, Urine STRAW (*)    APPearance CLEAR (*)    Specific Gravity, Urine 1.003 (*)    All other components within normal limits  BASIC METABOLIC PANEL - Abnormal; Notable for the following components:   Potassium 2.8 (*)    CO2 20 (*)    Calcium 8.3 (*)    All other components within normal limits  CBC  CBG MONITORING, ED  TROPONIN I (HIGH SENSITIVITY)  TROPONIN I (HIGH SENSITIVITY)     EKG Interpreted by me Normal sinus rhythm rate of 60.  Normal axis, normal intervals.  Poor R wave progression.  Normal ST segments and T waves.  No ischemic changes.  No evidence of underlying arrhythmia.   RADIOLOGY    PROCEDURES:  Procedures   MEDICATIONS ORDERED IN ED: Medications  potassium chloride SA (KLOR-CON M) CR  tablet 60 mEq (60 mEq Oral Given 01/16/23 1329)  ondansetron (ZOFRAN) injection 4 mg (4 mg Intravenous Given 01/16/23 1336)  sodium chloride 0.9 % bolus 500 mL (500 mLs Intravenous New Bag/Given 01/16/23 1335)     IMPRESSION / MDM / ASSESSMENT AND PLAN / ED COURSE  I reviewed the triage vital signs and the nursing notes.                              Differential diagnosis includes, but is not limited to, dehydration, electrolyte abnormality, anemia, UTI, non-STEMI  Patient's presentation is most consistent with acute presentation with potential threat to life or bodily function.  Patient presents with episode of near syncope without any associated focal symptoms.  On arrival to the ED she reports some nausea but otherwise feels fine.  Still no pain.  Vital signs and exam are reassuring.  Lab panel all unremarkable except for potassium of 2.8 which may be related to her HCTZ use.  Patient given oral  supplementation, IV fluids for hydration, and Zofran for nausea.  On reassessment she is feeling much better.  Will continue her on a potassium supplement at home, Zofran as needed, follow-up with PCP.  She has an appointment coming up in the next 2 weeks.  Patient is comfortable monitoring her blood pressure at home, and advised her to increase her lisinopril to 10 mg in the meantime if she continues to see persistently elevated blood pressures.      FINAL CLINICAL IMPRESSION(S) / ED DIAGNOSES   Final diagnoses:  Near syncope     Rx / DC Orders   ED Discharge Orders          Ordered    ondansetron (ZOFRAN-ODT) 4 MG disintegrating tablet  Every 8 hours PRN        01/16/23 1443    potassium chloride (KLOR-CON) 10 MEQ tablet  Daily        01/16/23 1443             Note:  This document was prepared using Dragon voice recognition software and may include unintentional dictation errors.   Carrie Mew, MD 01/16/23 [phone]

## 2023-01-16 NOTE — ED Notes (Signed)
Pt kept down crackers and water

## 2023-01-16 NOTE — Progress Notes (Signed)
   01/16/23 1500  Spiritual Encounters  Type of Visit Initial  Care provided to: Pt and family  Conversation partners present during encounter Other (comment);Nurse  Referral source Chaplain team  Reason for visit Routine spiritual support  OnCall Visit Yes   Chaplain stopped by making rounds. Patient doing well and discharging.

## 2023-01-18 ENCOUNTER — Telehealth: Payer: Self-pay | Admitting: Internal Medicine

## 2023-01-18 NOTE — Telephone Encounter (Signed)
Called patient to schedule ED follow up. She stated she is feeling better, will see [place] 01/28/23 for her appointment-Toni

## 2023-01-21 ENCOUNTER — Inpatient Hospital Stay: Payer: Medicare HMO | Attending: Oncology

## 2023-01-21 DIAGNOSIS — D751 Secondary polycythemia: Secondary | ICD-10-CM | POA: Insufficient documentation

## 2023-01-21 LAB — CBC WITH DIFFERENTIAL/PLATELET
Abs Immature Granulocytes: 0.11 10*3/uL — ABNORMAL HIGH (ref 0.00–0.07)
Basophils Absolute: 0.1 10*3/uL (ref 0.0–0.1)
Basophils Relative: 1 %
Eosinophils Absolute: 0.3 10*3/uL (ref 0.0–0.5)
Eosinophils Relative: 4 %
HCT: 42.4 % (ref 36.0–46.0)
Hemoglobin: 14.4 g/dL (ref 12.0–15.0)
Immature Granulocytes: 2 %
Lymphocytes Relative: 34 %
Lymphs Abs: 2.4 10*3/uL (ref 0.7–4.0)
MCH: 31.6 pg (ref 26.0–34.0)
MCHC: 34 g/dL (ref 30.0–36.0)
MCV: 93 fL (ref 80.0–100.0)
Monocytes Absolute: 0.7 10*3/uL (ref 0.1–1.0)
Monocytes Relative: 10 %
Neutro Abs: 3.5 10*3/uL (ref 1.7–7.7)
Neutrophils Relative %: 49 %
Platelets: 239 10*3/uL (ref 150–400)
RBC: 4.56 MIL/uL (ref 3.87–5.11)
RDW: 12.6 % (ref 11.5–15.5)
WBC: 7.1 10*3/uL (ref 4.0–10.5)
nRBC: 0 % (ref 0.0–0.2)

## 2023-01-21 LAB — IRON AND TIBC
Iron: 113 ug/dL (ref 28–170)
Saturation Ratios: 28 % (ref 10.4–31.8)
TIBC: 400 ug/dL (ref 250–450)
UIBC: 287 ug/dL

## 2023-01-21 LAB — FERRITIN: Ferritin: 94 ng/mL (ref 11–307)

## 2023-01-23 ENCOUNTER — Telehealth: Payer: Self-pay

## 2023-01-23 ENCOUNTER — Telehealth (INDEPENDENT_AMBULATORY_CARE_PROVIDER_SITE_OTHER): Payer: Medicare HMO | Admitting: Nurse Practitioner

## 2023-01-23 ENCOUNTER — Encounter: Payer: Self-pay | Admitting: Nurse Practitioner

## 2023-01-23 VITALS — Resp 16 | Ht <= 58 in | Wt 141.0 lb

## 2023-01-23 DIAGNOSIS — R053 Chronic cough: Secondary | ICD-10-CM

## 2023-01-23 MED ORDER — BENZONATATE 200 MG PO CAPS: 200.0000 mg | ORAL_CAPSULE | Freq: Two times a day (BID) | ORAL | 0 refills | Status: DC | PRN

## 2023-01-23 MED ORDER — HYDROCOD POLI-CHLORPHE POLI ER 10-8 MG/5ML PO SUER: 5.0000 mL | Freq: Every evening | ORAL | 0 refills | Status: DC | PRN

## 2023-01-23 NOTE — Progress Notes (Signed)
[facility] [place], [place] [address]  Internal MEDICINE  Telephone Visit  Patient Name: Cynthia Keith  [id number]  [id number]  Date of Service: 01/23/2023  I connected with the patient at 1630 by telephone and verified the patients identity using two identifiers.   I discussed the limitations, risks, security and privacy concerns of performing an evaluation and management service by telephone and the availability of in person appointments. I also discussed with the patient that there may be a patient responsible charge related to the service.  The patient expressed understanding and agrees to proceed.    Chief Complaint  Patient presents with   Telephone Screen    [phone]   Telephone Assessment   Cough    Dry cough for 2 weeks     HPI Cynthia Keith presents for a telehealth virtual visit for dry cough x2 weeks.  Negative for covid. Denies any SOB, chest tightness, wheezing, nasal congestion, fatigue, sore throat or headache.     Current Medication: Outpatient Encounter Medications as of 01/23/2023  Medication Sig   amLODipine (NORVASC) 10 MG tablet TAKE 1 TABLET BY MOUTH EVERY DAY   aspirin EC 81 MG tablet Take 81 mg by mouth daily.   benzonatate (TESSALON) 200 MG capsule Take 1 capsule (200 mg total) by mouth 2 (two) times daily as needed for cough.   chlorpheniramine-HYDROcodone (TUSSIONEX) 10-8 MG/5ML Take 5 mLs by mouth at bedtime as needed for cough.   estradiol (ESTRACE) 0.1 MG/GM vaginal cream Apply one pea-sized amount around the opening of the urethra daily for 2 weeks, then 3 times weekly moving forward.   hydrochlorothiazide (HYDRODIURIL) 12.5 MG tablet Take 12.5 mg by mouth daily.   lisinopril (ZESTRIL) 5 MG tablet Take 1 tablet (5 mg total) by mouth daily.   Multiple Vitamins-Minerals (HAIR SKIN & NAILS PO) Take by mouth.   omeprazole (PRILOSEC) 20 MG capsule TAKE 1 CAPSULE BY MOUTH EVERY DAY   ondansetron (ZOFRAN-ODT) 4 MG disintegrating  tablet Take 1 tablet (4 mg total) by mouth every 8 (eight) hours as needed for nausea or vomiting.   OVER THE COUNTER MEDICATION Doterra-EO Mega/AlphaCRS/MIcro Plex VM/Deep Blue Veg capsules   potassium chloride (KLOR-CON) 10 MEQ tablet Take 1 tablet (10 mEq total) by mouth daily for 7 days.   rosuvastatin (CRESTOR) 5 MG tablet TAKE 1 TABLET (5 MG TOTAL) BY MOUTH DAILY.   No facility-administered encounter medications on file as of 01/23/2023.    Surgical History: Past Surgical History:  Procedure Laterality Date   ABDOMINAL HYSTERECTOMY     COLONOSCOPY WITH PROPOFOL N/A 12/12/2015   Procedure: COLONOSCOPY WITH PROPOFOL;  Surgeon: Manya Silvas, MD;  Location: [facility] ENDOSCOPY;  Service: Endoscopy;  Laterality: N/A;   HAND SURGERY  2005    Medical History: Past Medical History:  Diagnosis Date   Actinic keratosis 03/15/2021   L post thigh    Basal cell carcinoma 06/21/2021   L alar crease, EDC done 07/31/21   GERD (gastroesophageal reflux disease)    Hyperlipidemia    Hypertension    Malignant essential hypertension 02/26/2017   Plantar fasciitis    Sleep apnea    Urinary tract infection     Family History: Family History  Problem Relation Age of Onset   Breast cancer Cousin    Stroke Mother    Heart disease Paternal Grandmother    Cancer Paternal Grandfather    Stroke Sister    Stroke Brother     Social History  Socioeconomic History   Marital status: Married    Spouse name: Not on file   Number of children: Not on file   Years of education: Not on file   Highest education level: Not on file  Occupational History   Not on file  Tobacco Use   Smoking status: Never   Smokeless tobacco: Never  Vaping Use   Vaping Use: Never used  Substance and Sexual Activity   Alcohol use: Yes    Comment: ocassionally   Drug use: No   Sexual activity: Yes    Birth control/protection: Post-menopausal, Surgical  Other Topics Concern   Not on file  Social History Narrative    Not on file   Social Determinants of Health   Financial Resource Strain: Not on file  Food Insecurity: No Food Insecurity (10/20/2022)   Hunger Vital Sign    Worried About Running Out of Food in the Last Year: Never true    Ran Out of Food in the Last Year: Never true  Transportation Needs: No Transportation Needs (10/20/2022)   PRAPARE - Hydrologist (Medical): No    Lack of Transportation (Non-Medical): No  Physical Activity: Not on file  Stress: Not on file  Social Connections: Not on file  Intimate Partner Violence: Not At Risk (10/20/2022)   Humiliation, Afraid, Rape, and Kick questionnaire    Fear of Current or Ex-Partner: No    Emotionally Abused: No    Physically Abused: No    Sexually Abused: No      Review of Systems  HENT:  Positive for postnasal drip. Negative for congestion, rhinorrhea, sinus pressure, sinus pain, sneezing, sore throat and trouble swallowing.   Respiratory:  Positive for cough. Negative for chest tightness, shortness of breath and wheezing.     Vital Signs: Resp 16   Ht '4\' 8"'$  (1.422 m)   Wt 141 lb (64 kg)   BMI 31.61 kg/m    Observation/Objective: She is alert and oriented. No acute distress noted.     Assessment/Plan: 1. Persistent dry cough Benzonatate prescribed prn for cough, tussionex prescribed for increased coughing at night.  - benzonatate (TESSALON) 200 MG capsule; Take 1 capsule (200 mg total) by mouth 2 (two) times daily as needed for cough.  Dispense: 20 capsule; Refill: 0 - chlorpheniramine-HYDROcodone (TUSSIONEX) 10-8 MG/5ML; Take 5 mLs by mouth at bedtime as needed for cough.  Dispense: 70 mL; Refill: 0   General Counseling: Latishia verbalizes understanding of the findings of today's phone visit and agrees with plan of treatment. I have discussed any further diagnostic evaluation that may be needed or ordered today. We also reviewed her medications today. she has been encouraged to call the  office with any questions or concerns that should arise related to todays visit.  Return if symptoms worsen or fail to improve.   No orders of the defined types were placed in this encounter.   Meds ordered this encounter  Medications   benzonatate (TESSALON) 200 MG capsule    Sig: Take 1 capsule (200 mg total) by mouth 2 (two) times daily as needed for cough.    Dispense:  20 capsule    Refill:  0   chlorpheniramine-HYDROcodone (TUSSIONEX) 10-8 MG/5ML    Sig: Take 5 mLs by mouth at bedtime as needed for cough.    Dispense:  70 mL    Refill:  0    Time spent:10 Minutes Time spent with patient included reviewing progress notes, labs, imaging  studies, and discussing plan for follow up.  [place] Controlled Substance Database was reviewed by me for overdose risk score (ORS) if appropriate.  This patient was seen by Jonetta Osgood, FNP-C in collaboration with Dr. Clayborn Bigness as a part of collaborative care agreement.  Dezerae Freiberger R. Valetta Fuller, MSN, FNP-C Internal medicine

## 2023-01-23 NOTE — Telephone Encounter (Signed)
     Patient  visit on 01/16/2023  at [facility] was for near syncope, dizziness.  Have you been able to follow up with your primary care physician? Patient has appointment scheduled 01/28/2023.  The patient was or was not able to obtain any needed medicine or equipment. No medication prescribed.  Are there diet recommendations that you are having difficulty following? No  Patient expresses understanding of discharge instructions and education provided has no other needs at this time.    [facility] Resource Care Guide   [email or web address]  ?? [phone]   Website: [email or web address]  [email or web address]

## 2023-01-23 NOTE — Telephone Encounter (Signed)
Spoke with pt made her appt today for cough

## 2023-01-28 ENCOUNTER — Encounter: Payer: Self-pay | Admitting: Physician Assistant

## 2023-01-28 ENCOUNTER — Inpatient Hospital Stay: Payer: Medicare HMO | Attending: Oncology | Admitting: Oncology

## 2023-01-28 ENCOUNTER — Ambulatory Visit: Payer: Medicare HMO | Admitting: Urology

## 2023-01-28 ENCOUNTER — Encounter: Payer: Self-pay | Admitting: Oncology

## 2023-01-28 ENCOUNTER — Ambulatory Visit (INDEPENDENT_AMBULATORY_CARE_PROVIDER_SITE_OTHER): Payer: Medicare HMO | Admitting: Physician Assistant

## 2023-01-28 ENCOUNTER — Inpatient Hospital Stay: Payer: Medicare HMO

## 2023-01-28 VITALS — BP 162/72 | HR 63 | Temp 98.4°F | Resp 16 | Ht <= 58 in | Wt 146.0 lb

## 2023-01-28 DIAGNOSIS — E876 Hypokalemia: Secondary | ICD-10-CM

## 2023-01-28 DIAGNOSIS — K760 Fatty (change of) liver, not elsewhere classified: Secondary | ICD-10-CM | POA: Insufficient documentation

## 2023-01-28 DIAGNOSIS — E559 Vitamin D deficiency, unspecified: Secondary | ICD-10-CM

## 2023-01-28 DIAGNOSIS — Z79899 Other long term (current) drug therapy: Secondary | ICD-10-CM | POA: Insufficient documentation

## 2023-01-28 DIAGNOSIS — I10 Essential (primary) hypertension: Secondary | ICD-10-CM | POA: Diagnosis not present

## 2023-01-28 DIAGNOSIS — D751 Secondary polycythemia: Secondary | ICD-10-CM | POA: Diagnosis not present

## 2023-01-28 DIAGNOSIS — E538 Deficiency of other specified B group vitamins: Secondary | ICD-10-CM

## 2023-01-28 MED ORDER — LISINOPRIL 10 MG PO TABS: 10.0000 mg | ORAL_TABLET | Freq: Every day | ORAL | 3 refills | Status: DC

## 2023-01-28 NOTE — Assessment & Plan Note (Signed)
Recommend life style modification.

## 2023-01-28 NOTE — Progress Notes (Signed)
[facility] [place], [place] [address]  Internal MEDICINE  Office Visit Note  Patient Name: Cynthia Keith  [id number]  [id number]  Date of Service: 01/28/2023  Chief Complaint  Patient presents with   Follow-up   Gastroesophageal Reflux   Hypertension   Hyperlipidemia   Memory Loss    Recently having short-term memory loss   Quality Metric Gaps    TDAP    HPI Pt is here for routine follow up -Since last visit patient had an episode of dizziness at work and EMS was called and found to have elevated BP and was taken ED. In ED was found to have low potassium. Was given supplement for potassium for 1 week and recommended to increase to '10mg'$  Lisinopril however she was not aware of this and has not done this. Calcium also low on recent labs and will supplement -At last visit lisinopril had been added with lab order placed to check potassium, but unfortunately pt did not have this done. -Will increase to '10mg'$  Lisinopril at this time and will need to recheck potassium in 2 weeks. Instructions written for patient to remind her to have this done. If continued low potassium will need supplement -Has not checked BP very much recently and advised to check daily now -Will go ahead and check b12 and vit D with cmp as well -Continues to have some forgetfulness, mainly with directions to places she doesn't travel regularly. MMSE done previously and was normal. 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   benzonatate (TESSALON) 200 MG capsule Take 1 capsule (200 mg total) by mouth 2 (two) times daily as needed for cough.    chlorpheniramine-HYDROcodone (TUSSIONEX) 10-8 MG/5ML Take 5 mLs by mouth at bedtime as needed for cough.   estradiol (ESTRACE) 0.1 MG/GM vaginal cream Apply one pea-sized amount around the opening of the urethra daily for 2 weeks, then 3 times weekly moving forward.   hydrochlorothiazide (HYDRODIURIL) 12.5 MG tablet Take 12.5 mg by mouth daily.   lisinopril (ZESTRIL) 10 MG tablet Take 1 tablet (10 mg total) by mouth daily.   Multiple Vitamins-Minerals (HAIR SKIN & NAILS PO) Take by mouth.   omeprazole (PRILOSEC) 20 MG capsule TAKE 1 CAPSULE BY MOUTH EVERY DAY   ondansetron (ZOFRAN-ODT) 4 MG disintegrating tablet Take 1 tablet (4 mg total) by mouth every 8 (eight) hours as needed for nausea or vomiting.   OVER THE COUNTER MEDICATION Doterra-EO Mega/AlphaCRS/MIcro Plex VM/Deep Blue Veg capsules   rosuvastatin (CRESTOR) 5 MG tablet TAKE 1 TABLET (5 MG TOTAL) BY MOUTH DAILY.   [DISCONTINUED] lisinopril (ZESTRIL) 5 MG tablet Take 1 tablet (5 mg total) by mouth daily.   potassium chloride (KLOR-CON) 10 MEQ tablet Take 1 tablet (10 mEq total) by mouth daily for 7 days.   No facility-administered encounter medications on file as of 01/28/2023.    Surgical History: Past Surgical History:  Procedure Laterality Date   ABDOMINAL HYSTERECTOMY     COLONOSCOPY WITH PROPOFOL N/A 12/12/2015   Procedure: COLONOSCOPY WITH PROPOFOL;  Surgeon: Manya Silvas, MD;  Location: [facility] ENDOSCOPY;  Service: Endoscopy;  Laterality:  N/A;   HAND SURGERY  2005    Medical History: Past Medical History:  Diagnosis Date   Actinic keratosis 03/15/2021   L post thigh    Basal cell carcinoma 06/21/2021   L alar crease, EDC done 07/31/21   GERD (gastroesophageal reflux disease)    Hyperlipidemia    Hypertension    Malignant essential hypertension 02/26/2017   Plantar fasciitis    Sleep apnea    Urinary tract infection     Family History: Family History  Problem Relation Age of Onset   Breast cancer Cousin    Stroke  Mother    Heart disease Paternal Grandmother    Cancer Paternal Grandfather    Stroke Sister    Stroke Brother     Social History   Socioeconomic History   Marital status: Married    Spouse name: Not on file   Number of children: Not on file   Years of education: Not on file   Highest education level: Not on file  Occupational History   Not on file  Tobacco Use   Smoking status: Never   Smokeless tobacco: Never  Vaping Use   Vaping Use: Never used  Substance and Sexual Activity   Alcohol use: Yes    Comment: ocassionally   Drug use: No   Sexual activity: Yes    Birth control/protection: Post-menopausal, Surgical  Other Topics Concern   Not on file  Social History Narrative   Not on file   Social Determinants of Health   Financial Resource Strain: Not on file  Food Insecurity: No Food Insecurity (10/20/2022)   Hunger Vital Sign    Worried About Running Out of Food in the Last Year: Never true    Ran Out of Food in the Last Year: Never true  Transportation Needs: No Transportation Needs (10/20/2022)   PRAPARE - Hydrologist (Medical): No    Lack of Transportation (Non-Medical): No  Physical Activity: Not on file  Stress: Not on file  Social Connections: Not on file  Intimate Partner Violence: Not At Risk (10/20/2022)   Humiliation, Afraid, Rape, and Kick questionnaire    Fear of Current or Ex-Partner: No    Emotionally Abused: No    Physically Abused: No    Sexually Abused: No      Review of Systems  Constitutional:  Negative for chills, fatigue and unexpected weight change.  HENT:  Negative for congestion, postnasal drip, rhinorrhea, sneezing and sore throat.   Eyes:  Negative for redness.  Respiratory:  Negative for cough, chest tightness and shortness of breath.   Cardiovascular:  Negative for chest pain and palpitations.  Gastrointestinal:  Negative for abdominal pain, constipation, diarrhea, nausea and vomiting.   Genitourinary:  Negative for dysuria and frequency.  Musculoskeletal:  Negative for arthralgias, back pain, joint swelling and neck pain.  Skin:  Negative for rash.  Neurological: Negative.  Negative for tremors and numbness.  Hematological:  Negative for adenopathy. Does not bruise/bleed easily.  Psychiatric/Behavioral:  Negative for behavioral problems (Depression), sleep disturbance and suicidal ideas.     Vital Signs: BP (!) 162/72 Comment: 164/76  Pulse 63   Temp 98.4 F (36.9 C)   Resp 16   Ht '4\' 8"'$  (1.422 m)   Wt 146 lb (66.2 kg)   SpO2 99%   BMI 32.73 kg/m    Physical Exam Vitals and nursing note reviewed.  Constitutional:      General: She is not in acute  distress.    Appearance: Normal appearance. She is well-developed. She is obese. She is not diaphoretic.  HENT:     Head: Normocephalic and atraumatic.     Mouth/Throat:     Pharynx: No oropharyngeal exudate.  Eyes:     Pupils: Pupils are equal, round, and reactive to light.  Neck:     Thyroid: No thyromegaly.     Vascular: No JVD.     Trachea: No tracheal deviation.  Cardiovascular:     Rate and Rhythm: Normal rate and regular rhythm.     Heart sounds: Normal heart sounds. No murmur heard.    No friction rub. No gallop.  Pulmonary:     Effort: Pulmonary effort is normal. No respiratory distress.     Breath sounds: No wheezing or rales.  Chest:     Chest wall: No tenderness.  Abdominal:     General: Bowel sounds are normal.     Palpations: Abdomen is soft.  Musculoskeletal:        General: Normal range of motion.     Cervical back: Normal range of motion and neck supple.  Lymphadenopathy:     Cervical: No cervical adenopathy.  Skin:    General: Skin is warm and dry.  Neurological:     Mental Status: She is alert and oriented to person, place, and time.     Cranial Nerves: No cranial nerve deficit.  Psychiatric:        Behavior: Behavior normal.        Thought Content: Thought content normal.         Judgment: Judgment normal.        Assessment/Plan: 1. Essential hypertension Will increase to '10mg'$  daily and continue other medications as before. Will need to recheck potassium in 2 weeks and monitor BP daily - lisinopril (ZESTRIL) 10 MG tablet; Take 1 tablet (10 mg total) by mouth daily.  Dispense: 90 tablet; Refill: 3  2. Hypokalemia Will have labs done in 2 weeks to recheck after supplement and Lisinopril adjustment - Comprehensive metabolic panel  3. Vitamin B12 deficiency - B12 and Folate Panel  4. Vitamin D deficiency - VITAMIN D 25 Hydroxy (Vit-D Deficiency, Fractures)    General Counseling: Zandra verbalizes understanding of the findings of todays visit and agrees with plan of treatment. I have discussed any further diagnostic evaluation that may be needed or ordered today. We also reviewed her medications today. she has been encouraged to call the office with any questions or concerns that should arise related to todays visit.    Orders Placed This Encounter  Procedures   Comprehensive metabolic panel   E31 and Folate Panel   VITAMIN D 25 Hydroxy (Vit-D Deficiency, Fractures)    Meds ordered this encounter  Medications   lisinopril (ZESTRIL) 10 MG tablet    Sig: Take 1 tablet (10 mg total) by mouth daily.    Dispense:  90 tablet    Refill:  3    This patient was seen by Drema Dallas, PA-C in collaboration with Dr. Clayborn Bigness as a part of collaborative care agreement.   Total time spent:30 Minutes Time spent includes review of chart, medications, test results, and follow up plan with the patient.      Dr Lavera Guise Internal medicine

## 2023-01-28 NOTE — Assessment & Plan Note (Addendum)
#  Compound heterozygous hemochromatosis mutation Patient carries 2 mutations [id number] and [id number] Labs reviewed and discussed with patient. Iron labs remain stable, ferritin level 94 Normal liver function.  No need for phlebotomy at this point for isolated increased iron saturation Continue expectant management. Avoid iron supplementation, alcohol use, vitamin C supplementation.

## 2023-01-28 NOTE — Progress Notes (Signed)
Hematology/Oncology Progress note [phone] [phone]     CHIEF COMPLAINTS/REASON FOR VISIT:  Follow-up for compound heterozygous hemochromatosis  ASSESSMENT & PLAN:   Hemochromatosis #Compound heterozygous hemochromatosis mutation Patient carries 2 mutations [id number] and [id number] Labs reviewed and discussed with patient. Iron labs remain stable, ferritin level 94 Normal liver function.  No need for phlebotomy at this point for isolated increased iron saturation Continue expectant management. Avoid iron supplementation, alcohol use, vitamin C supplementation.  Fatty liver disease, nonalcoholic Recommend life style modification.   Erythrocytosis Likely reactive. Resolved.   Orders Placed This Encounter  Procedures   CBC with Differential/Platelet    Standing Status:   Future    Standing Expiration Date:   01/29/2024   Comprehensive metabolic panel    Standing Status:   Future    Standing Expiration Date:   01/29/2024   Ferritin    Standing Status:   Future    Standing Expiration Date:   01/29/2024   Iron and TIBC    Standing Status:   Future    Standing Expiration Date:   01/29/2024   Follow up in 1 year All questions were answered. The patient knows to call the clinic with any problems, questions or concerns.  Earlie Server, MD, PhD [facility] Hematology Oncology 01/28/2023   PERTINENT HEMATOLOGY HISTORY  07/05/2020, patient had blood work done which showed decreased iron saturation 14, ferritin level was increased at 356, TIBC 315.  Patient was referred to hematology for further evaluation for possible hemochromatosis/elevated ferritin. Patient reports feeling well at baseline today. Patient drinks red wine 4-5 days per week.  Occasionally she also drinks beer when she orders pizza. Denies any recent infection, new medication. Denies any family history of hemochromatosis.   INTERVAL HISTORY Cynthia Keith is a 77 y.o. female who has above history  reviewed by me today presents for follow up visit for compound heterozygous hemochromatosis mutation Today patient reports feeling well. No new complaints  Review of Systems  Constitutional:  Negative for appetite change, chills, fatigue and fever.  HENT:   Negative for hearing loss and voice change.   Eyes:  Negative for eye problems.  Respiratory:  Negative for chest tightness and cough.   Cardiovascular:  Negative for chest pain and leg swelling.  Gastrointestinal:  Negative for abdominal distention and blood in stool.  Endocrine: Negative for hot flashes.  Genitourinary:  Negative for difficulty urinating and frequency.   Musculoskeletal:  Negative for arthralgias.  Skin:  Negative for itching and rash.  Neurological:  Negative for extremity weakness.  Hematological:  Negative for adenopathy.  Psychiatric/Behavioral:  Negative for confusion.     MEDICAL HISTORY:  Past Medical History:  Diagnosis Date   Actinic keratosis 03/15/2021   L post thigh    Basal cell carcinoma 06/21/2021   L alar crease, EDC done 07/31/21   GERD (gastroesophageal reflux disease)    Hyperlipidemia    Hypertension    Malignant essential hypertension 02/26/2017   Plantar fasciitis    Sleep apnea    Urinary tract infection     SURGICAL HISTORY: Past Surgical History:  Procedure Laterality Date   ABDOMINAL HYSTERECTOMY     COLONOSCOPY WITH PROPOFOL N/A 12/12/2015   Procedure: COLONOSCOPY WITH PROPOFOL;  Surgeon: Manya Silvas, MD;  Location: [facility];  Service: Endoscopy;  Laterality: N/A;   HAND SURGERY  2005    SOCIAL HISTORY: Social History   Socioeconomic History   Marital status: Married    Spouse name:  Not on file   Number of children: Not on file   Years of education: Not on file   Highest education level: Not on file  Occupational History   Not on file  Tobacco Use   Smoking status: Never   Smokeless tobacco: Never  Vaping Use   Vaping Use: Never used  Substance and  Sexual Activity   Alcohol use: Yes    Comment: ocassionally   Drug use: No   Sexual activity: Yes    Birth control/protection: Post-menopausal, Surgical  Other Topics Concern   Not on file  Social History Narrative   Not on file   Social Determinants of Health   Financial Resource Strain: Not on file  Food Insecurity: No Food Insecurity (10/20/2022)   Hunger Vital Sign    Worried About Running Out of Food in the Last Year: Never true    Ran Out of Food in the Last Year: Never true  Transportation Needs: No Transportation Needs (10/20/2022)   PRAPARE - Hydrologist (Medical): No    Lack of Transportation (Non-Medical): No  Physical Activity: Not on file  Stress: Not on file  Social Connections: Not on file  Intimate Partner Violence: Not At Risk (10/20/2022)   Humiliation, Afraid, Rape, and Kick questionnaire    Fear of Current or Ex-Partner: No    Emotionally Abused: No    Physically Abused: No    Sexually Abused: No    FAMILY HISTORY: Family History  Problem Relation Age of Onset   Breast cancer Cousin    Stroke Mother    Heart disease Paternal Grandmother    Cancer Paternal Grandfather    Stroke Sister    Stroke Brother     ALLERGIES:  is allergic to iodinated contrast media and iodine.  MEDICATIONS:  Current Outpatient Medications  Medication Sig Dispense Refill   amLODipine (NORVASC) 10 MG tablet TAKE 1 TABLET BY MOUTH EVERY DAY 90 tablet 1   aspirin EC 81 MG tablet Take 81 mg by mouth daily.     benzonatate (TESSALON) 200 MG capsule Take 1 capsule (200 mg total) by mouth 2 (two) times daily as needed for cough. 20 capsule 0   chlorpheniramine-HYDROcodone (TUSSIONEX) 10-8 MG/5ML Take 5 mLs by mouth at bedtime as needed for cough. 70 mL 0   estradiol (ESTRACE) 0.1 MG/GM vaginal cream Apply one pea-sized amount around the opening of the urethra daily for 2 weeks, then 3 times weekly moving forward. 42.5 g 12   hydrochlorothiazide  (HYDRODIURIL) 12.5 MG tablet Take 12.5 mg by mouth daily.     lisinopril (ZESTRIL) 10 MG tablet Take 1 tablet (10 mg total) by mouth daily. 90 tablet 3   Multiple Vitamins-Minerals (HAIR SKIN & NAILS PO) Take by mouth.     omeprazole (PRILOSEC) 20 MG capsule TAKE 1 CAPSULE BY MOUTH EVERY DAY 90 capsule 1   ondansetron (ZOFRAN-ODT) 4 MG disintegrating tablet Take 1 tablet (4 mg total) by mouth every 8 (eight) hours as needed for nausea or vomiting. 20 tablet 0   OVER THE COUNTER MEDICATION Doterra-EO Mega/AlphaCRS/MIcro Plex VM/Deep Blue Veg capsules     potassium chloride (KLOR-CON) 10 MEQ tablet Take 1 tablet (10 mEq total) by mouth daily for 7 days. 7 tablet 0   rosuvastatin (CRESTOR) 5 MG tablet TAKE 1 TABLET (5 MG TOTAL) BY MOUTH DAILY. 90 tablet 3   No current facility-administered medications for this visit.     PHYSICAL EXAMINATION: ECOG PERFORMANCE STATUS:  1 - Symptomatic but completely ambulatory Vitals:   01/28/23 1354  BP: (!) 149/95  Pulse: 79  Temp: 97.6 F (36.4 C)  SpO2: 99%   Filed Weights   01/28/23 1354  Weight: 146 lb 12.8 oz (66.6 kg)    Physical Exam Constitutional:      General: She is not in acute distress. HENT:     Head: Normocephalic and atraumatic.  Eyes:     General: No scleral icterus. Cardiovascular:     Rate and Rhythm: Normal rate and regular rhythm.     Heart sounds: Normal heart sounds.  Pulmonary:     Effort: Pulmonary effort is normal. No respiratory distress.     Breath sounds: No wheezing.  Abdominal:     General: Bowel sounds are normal. There is no distension.     Palpations: Abdomen is soft.  Musculoskeletal:        General: No deformity. Normal range of motion.     Cervical back: Normal range of motion and neck supple.  Skin:    General: Skin is warm and dry.     Findings: No erythema or rash.  Neurological:     Mental Status: She is alert and oriented to person, place, and time. Mental status is at baseline.     Cranial  Nerves: No cranial nerve deficit.     Coordination: Coordination normal.  Psychiatric:        Mood and Affect: Mood normal.     LABORATORY DATA:  I have reviewed the data as listed     Latest Ref Rng & Units 01/21/2023    9:38 AM 01/16/2023   11:37 AM 10/20/2022    6:56 AM  CBC  WBC 4.0 - 10.5 K/uL 7.1  7.7  6.5   Hemoglobin 12.0 - 15.0 g/dL 14.4  14.0  13.4   Hematocrit 36.0 - 46.0 % 42.4  40.1  39.5   Platelets 150 - 400 K/uL 239  252  215     Lab Results  Component Value Date   IRON 113 01/21/2023   TIBC 400 01/21/2023   FERRITIN 94 01/21/2023     RADIOGRAPHIC STUDIES: I have personally reviewed the radiological images as listed and agreed with the findings in the report. Ultrasound renal complete  Result Date: 01/15/2023 CLINICAL DATA:  Renal mass surveillance EXAM: RENAL / URINARY TRACT ULTRASOUND COMPLETE COMPARISON:  Renal ultrasounds dated January 24, 2022 and February 06, 2021 FINDINGS: Right Kidney: Renal measurements: 10.3 x 5.0 x 5.4 cm = volume: 145 mL. The known nearly isoechoic mass on the right measures 1.5 x 1.5 x 1.5 cm today versus 1.7 x 1.2 x 1.6 cm on the January 24, 2022 study and 1.4 x 1.5 x 1.2 cm on the February 06, 2021 study. This mass measured 1.6 x 1.4 x 1.2 cm on the February 23, 2019 study. The known complex cyst with a septation in the right kidney measures up to 15 mm today versus 9 mm on the January 24, 2022 study. Left Kidney: Renal measurements: 9.8 x 4.4 x 4.7 cm = volume: 106 mL. Echogenicity within normal limits. No mass or hydronephrosis visualized. Bladder: Appears normal for degree of bladder distention. Other: None. IMPRESSION: 1. The known isoechoic, solid-appearing mass in the right kidney measures 1.5 x 1.5 x 1.5 cm today versus 1.7 x 1.2 x 1.6 cm on the January 24, 2022 study and 1.4 x 1.5 x 1.2 cm on the February 06, 2021 study. The mass remains worrisome  for a slowly growing neoplasm. If not previously performed, recommend MRI of the  abdomen with and without contrast for better characterization. 2. The complicated/complex cyst in the right kidney measures larger in the interval. Recommend continued attention on follow-up. 3. The left kidney and bladder are normal. Electronically Signed   By: Dorise Bullion III M.D.   On: 01/15/2023 07:19

## 2023-01-28 NOTE — Assessment & Plan Note (Signed)
Likely reactive. Resolved.

## 2023-01-29 LAB — JAK2 V617F RFX CALR/MPL/E12-15

## 2023-01-29 LAB — CALR +MPL + E12-E15  (REFLEX)

## 2023-01-30 ENCOUNTER — Ambulatory Visit: Payer: Medicare HMO | Admitting: Urology

## 2023-02-01 ENCOUNTER — Telehealth: Payer: Self-pay

## 2023-02-01 MED ORDER — PREDNISONE 10 MG (21) PO TBPK: ORAL_TABLET | ORAL | 0 refills | Status: DC

## 2023-02-01 NOTE — Telephone Encounter (Signed)
Pt called that she is still coughing she finished cough med as per alyssa send prednisone and advised if she is not feeling better need appt

## 2023-02-02 ENCOUNTER — Other Ambulatory Visit: Payer: Self-pay | Admitting: Physician Assistant

## 2023-02-02 DIAGNOSIS — I10 Essential (primary) hypertension: Secondary | ICD-10-CM

## 2023-02-11 ENCOUNTER — Ambulatory Visit: Payer: Medicare HMO | Admitting: Urology

## 2023-02-11 ENCOUNTER — Encounter: Payer: Self-pay | Admitting: Urology

## 2023-02-11 VITALS — BP 121/72 | HR 91 | Ht <= 58 in | Wt 141.0 lb

## 2023-02-11 DIAGNOSIS — R351 Nocturia: Secondary | ICD-10-CM | POA: Diagnosis not present

## 2023-02-11 DIAGNOSIS — N2889 Other specified disorders of kidney and ureter: Secondary | ICD-10-CM

## 2023-02-11 NOTE — Progress Notes (Signed)
02/11/2023 2:53 PM   Cynthia Keith [birth year] [id number]  Referring provider: Lavera Guise, [place],  [place] [address]  Chief Complaint  Patient presents with   Recurrent UTI    Urologic History 1. Renal Mass - 2 x 1.5 x 1.4 cm enhancing right renal mass MRI 2009 - Surveillance elective  2.  Recurrent UTI   HPI: 77 y.o. female presents for annual follow-up  Since I saw her last year she has had several office visits for recurrent UTI symptoms but only 1 positive culture. Denies recent UTI symptoms though over the last 2 months she has had increasing nocturia every 2 hours.  Describes fairly large voided volumes.   No bothersome daytime symptoms States recent PCP visit was felt to have possible sleep apnea but has not yet undergone evaluation RUS 01/14/2023 with a stable solid right renal mass measuring 1.5 cm the complex cyst has slightly increased in size   PMH: Past Medical History:  Diagnosis Date   Actinic keratosis 03/15/2021   L post thigh    Basal cell carcinoma 06/21/2021   L alar crease, EDC done 07/31/21   GERD (gastroesophageal reflux disease)    Hyperlipidemia    Hypertension    Malignant essential hypertension 02/26/2017   Plantar fasciitis    Sleep apnea    Urinary tract infection     Surgical History: Past Surgical History:  Procedure Laterality Date   ABDOMINAL HYSTERECTOMY     COLONOSCOPY WITH PROPOFOL N/A 12/12/2015   Procedure: COLONOSCOPY WITH PROPOFOL;  Surgeon: Manya Silvas, MD;  Location: [facility];  Service: Endoscopy;  Laterality: N/A;   HAND SURGERY  2005    Home Medications:  Allergies as of 02/11/2023       Reactions   Iodinated Contrast Media Anaphylaxis   Remote hx of respiratory arrest after IV iodine for kidney study. Immediately treated w/o sequela. No previous or subsequent IVP exposure. Remote hx of respiratory arrest after IV iodine for kidney study. Immediately treated w/o sequela. No previous  or subsequent IVP exposure. Remote hx of respiratory arrest after IV iodine for kidney study. Immediately treated w/o sequela. No previous or subsequent IVP exposure.   Iodine Shortness Of Breath   "Quit Breathing"        Medication List        Accurate as of February 11, 2023  2:53 PM. If you have any questions, ask your nurse or doctor.          amLODipine 10 MG tablet Commonly known as: NORVASC TAKE 1 TABLET BY MOUTH EVERY DAY   aspirin EC 81 MG tablet Take 81 mg by mouth daily.   benzonatate 200 MG capsule Commonly known as: TESSALON Take 1 capsule (200 mg total) by mouth 2 (two) times daily as needed for cough.   chlorpheniramine-HYDROcodone 10-8 MG/5ML Commonly known as: TUSSIONEX Take 5 mLs by mouth at bedtime as needed for cough.   estradiol 0.1 MG/GM vaginal cream Commonly known as: ESTRACE Apply one pea-sized amount around the opening of the urethra daily for 2 weeks, then 3 times weekly moving forward.   HAIR SKIN & NAILS PO Take by mouth.   hydrochlorothiazide 12.5 MG tablet Commonly known as: HYDRODIURIL Take 12.5 mg by mouth daily.   lisinopril 10 MG tablet Commonly known as: ZESTRIL Take 1 tablet (10 mg total) by mouth daily.   omeprazole 20 MG capsule Commonly known as: PRILOSEC TAKE 1 CAPSULE BY MOUTH EVERY DAY  ondansetron 4 MG disintegrating tablet Commonly known as: ZOFRAN-ODT Take 1 tablet (4 mg total) by mouth every 8 (eight) hours as needed for nausea or vomiting.   OVER THE COUNTER MEDICATION Doterra-EO Mega/AlphaCRS/MIcro Plex VM/Deep Blue Veg capsules   potassium chloride 10 MEQ tablet Commonly known as: KLOR-CON Take 1 tablet (10 mEq total) by mouth daily for 7 days.   predniSONE 10 MG (21) Tbpk tablet Commonly known as: STERAPRED UNI-PAK 21 TAB Use as directed for 6 day   rosuvastatin 5 MG tablet Commonly known as: CRESTOR TAKE 1 TABLET (5 MG TOTAL) BY MOUTH DAILY.        Allergies:  Allergies  Allergen  Reactions   Iodinated Contrast Media Anaphylaxis    Remote hx of respiratory arrest after IV iodine for kidney study. Immediately treated w/o sequela. No previous or subsequent IVP exposure. Remote hx of respiratory arrest after IV iodine for kidney study. Immediately treated w/o sequela. No previous or subsequent IVP exposure. Remote hx of respiratory arrest after IV iodine for kidney study. Immediately treated w/o sequela. No previous or subsequent IVP exposure.   Iodine Shortness Of Breath    "Quit Breathing"    Family History: Family History  Problem Relation Age of Onset   Breast cancer Cousin    Stroke Mother    Heart disease Paternal Grandmother    Cancer Paternal Grandfather    Stroke Sister    Stroke Brother     Social History:  reports that she has never smoked. She has never used smokeless tobacco. She reports current alcohol use. She reports that she does not use drugs.   Physical Exam: BP 121/72   Pulse 91   Ht 4' 8"$  (1.422 m)   Wt 141 lb (64 kg)   BMI 31.61 kg/m   Constitutional:  Alert and oriented, No acute distress. HEENT: [place] AT, moist mucus membranes.  Trachea midline, no masses. Cardiovascular: No clubbing, cyanosis, or edema. Respiratory: Normal respiratory effort, no increased work of breathing. Psychiatric: Normal mood and affect.   Assessment & Plan:    1.  Right renal mass Stable solid right renal mass.  Slightly increased size of the complex right renal cyst Most recent imaging has been renal ultrasound with increased size of the cyst will schedule a renal mass protocol MRI.  2.  Nocturia We discussed sleep apnea is a common cause of nocturia and further evaluation of sleep apnea by her PCP is pending   Abbie Sons, [place] [address], [place], [place] [address] [phone]

## 2023-02-12 ENCOUNTER — Encounter: Payer: Self-pay | Admitting: Urology

## 2023-02-14 ENCOUNTER — Ambulatory Visit: Payer: Medicare HMO | Admitting: Urology

## 2023-02-19 ENCOUNTER — Telehealth: Payer: Self-pay

## 2023-02-19 DIAGNOSIS — J101 Influenza due to other identified influenza virus with other respiratory manifestations: Secondary | ICD-10-CM | POA: Diagnosis not present

## 2023-02-19 DIAGNOSIS — R531 Weakness: Secondary | ICD-10-CM | POA: Diagnosis not present

## 2023-02-19 DIAGNOSIS — R42 Dizziness and giddiness: Secondary | ICD-10-CM | POA: Diagnosis not present

## 2023-02-19 NOTE — Telephone Encounter (Signed)
Patient called stating she is having a dizzy spell like she did a few months ago, offer patient an appointment for tomorrow but she stated she believes she will go to the urgent care today.

## 2023-02-25 ENCOUNTER — Ambulatory Visit (INDEPENDENT_AMBULATORY_CARE_PROVIDER_SITE_OTHER): Payer: Medicare HMO | Admitting: Physician Assistant

## 2023-02-25 ENCOUNTER — Encounter: Payer: Self-pay | Admitting: Physician Assistant

## 2023-02-25 VITALS — BP 128/74 | HR 77 | Temp 98.4°F | Resp 16 | Ht <= 58 in | Wt 148.4 lb

## 2023-02-25 DIAGNOSIS — I10 Essential (primary) hypertension: Secondary | ICD-10-CM | POA: Diagnosis not present

## 2023-02-25 DIAGNOSIS — E538 Deficiency of other specified B group vitamins: Secondary | ICD-10-CM

## 2023-02-25 DIAGNOSIS — E876 Hypokalemia: Secondary | ICD-10-CM | POA: Diagnosis not present

## 2023-02-25 DIAGNOSIS — E559 Vitamin D deficiency, unspecified: Secondary | ICD-10-CM | POA: Diagnosis not present

## 2023-02-25 NOTE — Progress Notes (Signed)
[facility] [place], [place] [address]  Internal MEDICINE  Office Visit Note  Patient Name: Cynthia Keith  [id number]  [id number]  Date of Service: 02/25/2023  Chief Complaint  Patient presents with   Follow-up    Review labs   Gastroesophageal Reflux   Hypertension   Hyperlipidemia    HPI Pt is here for routine follow up with her husband -Did have the flu last week and went to walk in clinic and was treated with tamiflu. Reports she is feeling better now, but she was very fatigued and just rested for the last week which is not typical for her -Having more difficulty with remembering things. She has difficulty with names and directions of how to get places.  -previous MMSE was normal, but will continue to monitor.  -Does still need to have labs done to check potassium as well as B12 which can have some impact as well -BP greatly improved  Current Medication: Outpatient Encounter Medications as of 02/25/2023  Medication Sig   amLODipine (NORVASC) 10 MG tablet TAKE 1 TABLET BY MOUTH EVERY DAY   aspirin EC 81 MG tablet Take 81 mg by mouth daily.   benzonatate (TESSALON) 200 MG capsule Take 1 capsule (200 mg total) by mouth 2 (two) times daily as needed for cough.   chlorpheniramine-HYDROcodone (TUSSIONEX) 10-8 MG/5ML Take 5 mLs by mouth at bedtime as needed for cough.   estradiol (ESTRACE) 0.1 MG/GM vaginal cream Apply one pea-sized amount around the opening of the urethra daily for 2 weeks, then 3 times weekly moving forward.   hydrochlorothiazide (HYDRODIURIL) 12.5 MG tablet Take 12.5 mg by mouth daily.   lisinopril (ZESTRIL) 10 MG tablet Take 1 tablet (10 mg total) by mouth daily.   Multiple Vitamins-Minerals (HAIR SKIN & NAILS PO) Take by mouth.   omeprazole (PRILOSEC) 20 MG capsule TAKE 1 CAPSULE BY MOUTH EVERY DAY   ondansetron (ZOFRAN-ODT) 4 MG disintegrating tablet Take 1 tablet (4 mg total) by mouth every 8 (eight) hours as needed for nausea or  vomiting.   oseltamivir (TAMIFLU) 75 MG capsule Take 75 mg by mouth 2 (two) times daily.   OVER THE COUNTER MEDICATION Doterra-EO Mega/AlphaCRS/MIcro Plex VM/Deep Blue Veg capsules   rosuvastatin (CRESTOR) 5 MG tablet TAKE 1 TABLET (5 MG TOTAL) BY MOUTH DAILY.   [DISCONTINUED] predniSONE (STERAPRED UNI-PAK 21 TAB) 10 MG (21) TBPK tablet Use as directed for 6 day   potassium chloride (KLOR-CON) 10 MEQ tablet Take 1 tablet (10 mEq total) by mouth daily for 7 days.   No facility-administered encounter medications on file as of 02/25/2023.    Surgical History: Past Surgical History:  Procedure Laterality Date   ABDOMINAL HYSTERECTOMY     COLONOSCOPY WITH PROPOFOL N/A 12/12/2015   Procedure: COLONOSCOPY WITH PROPOFOL;  Surgeon: Manya Silvas, MD;  Location: [facility] END[facility]COPY;  Service: Endoscopy;  Laterality: N/A;   HAND SURGERY  2005    Medical History: Past Medical History:  Diagnosis Date   Actinic keratosis 03/15/2021   L post thigh    Basal cell carcinoma 06/21/2021   L alar crease, EDC done 07/31/21   GERD (gastroesophageal reflux disease)    Hyperlipidemia    Hypertension    Malignant essential hypertension 02/26/2017   Plantar fasciitis    Sleep apnea    Urinary tract infection     Family History: Family History  Problem Relation Age of Onset   Breast cancer Cousin    Stroke Mother  Heart disease Paternal Grandmother    Cancer Paternal Grandfather    Stroke Sister    Stroke Brother     Social History   Socioeconomic History   Marital status: Married    Spouse name: Not on file   Number of children: Not on file   Years of education: Not on file   Highest education level: Not on file  Occupational History   Not on file  Tobacco Use   Smoking status: Never   Smokeless tobacco: Never  Vaping Use   Vaping Use: Never used  Substance and Sexual Activity   Alcohol use: Yes    Comment: ocassionally   Drug use: No   Sexual activity: Yes    Birth  control/protection: Post-menopausal, Surgical  Other Topics Concern   Not on file  Social History Narrative   Not on file   Social Determinants of Health   Financial Resource Strain: Not on file  Food Insecurity: No Food Insecurity (10/20/2022)   Hunger Vital Sign    Worried About Running Out of Food in the Last Year: Never true    Ran Out of Food in the Last Year: Never true  Transportation Needs: No Transportation Needs (10/20/2022)   PRAPARE - Hydrologist (Medical): No    Lack of Transportation (Non-Medical): No  Physical Activity: Not on file  Stress: Not on file  Social Connections: Not on file  Intimate Partner Violence: Not At Risk (10/20/2022)   Humiliation, Afraid, Rape, and Kick questionnaire    Fear of Current or Ex-Partner: No    Emotionally Abused: No    Physically Abused: No    Sexually Abused: No      Review of Systems  Constitutional:  Negative for chills, fatigue and unexpected weight change.  HENT:  Negative for congestion, postnasal drip, rhinorrhea, sneezing and sore throat.   Eyes:  Negative for redness.  Respiratory:  Negative for cough, chest tightness and shortness of breath.   Cardiovascular:  Negative for chest pain and palpitations.  Gastrointestinal:  Negative for abdominal pain, constipation, diarrhea, nausea and vomiting.  Genitourinary:  Negative for dysuria and frequency.  Musculoskeletal:  Negative for arthralgias, back pain, joint swelling and neck pain.  Skin:  Negative for rash.  Neurological: Negative.  Negative for tremors and numbness.  Hematological:  Negative for adenopathy. Does not bruise/bleed easily.  Psychiatric/Behavioral:  Negative for behavioral problems (Depression), sleep disturbance and suicidal ideas.     Vital Signs: BP 128/74   Pulse 77   Temp 98.4 F (36.9 C)   Resp 16   Ht '4\' 8"'$  (1.422 m)   Wt 148 lb 6.4 oz (67.3 kg)   SpO2 99%   BMI 33.27 kg/m    Physical Exam Vitals and  nursing note reviewed.  Constitutional:      General: She is not in acute distress.    Appearance: Normal appearance. She is well-developed. She is obese. She is not diaphoretic.  HENT:     Head: Normocephalic and atraumatic.     Mouth/Throat:     Pharynx: No oropharyngeal exudate.  Eyes:     Pupils: Pupils are equal, round, and reactive to light.  Neck:     Thyroid: No thyromegaly.     Vascular: No JVD.     Trachea: No tracheal deviation.  Cardiovascular:     Rate and Rhythm: Normal rate and regular rhythm.     Heart sounds: Normal heart sounds. No murmur heard.  No friction rub. No gallop.  Pulmonary:     Effort: Pulmonary effort is normal. No respiratory distress.     Breath sounds: No wheezing or rales.  Chest:     Chest wall: No tenderness.  Abdominal:     General: Bowel sounds are normal.     Palpations: Abdomen is soft.  Musculoskeletal:        General: Normal range of motion.     Cervical back: Normal range of motion and neck supple.  Lymphadenopathy:     Cervical: No cervical adenopathy.  Skin:    General: Skin is warm and dry.  Neurological:     Mental Status: She is alert and oriented to person, place, and time.     Cranial Nerves: No cranial nerve deficit.  Psychiatric:        Behavior: Behavior normal.        Thought Content: Thought content normal.        Judgment: Judgment normal.        Assessment/Plan: 1. Essential hypertension Greatly improved, continue current medications  2. Hypokalemia Will go for labs previously ordered  3. Vitamin B12 deficiency Will go for labs previously ordered   General Counseling: teja moring understanding of the findings of todays visit and agrees with plan of treatment. I have discussed any further diagnostic evaluation that may be needed or ordered today. We also reviewed her medications today. she has been encouraged to call the office with any questions or concerns that should arise related to todays  visit.    No orders of the defined types were placed in this encounter.   No orders of the defined types were placed in this encounter.   This patient was seen by Drema Dallas, PA-C in collaboration with Dr. Clayborn Bigness as a part of collaborative care agreement.   Total time spent:30 Minutes Time spent includes review of chart, medications, test results, and follow up plan with the patient.      Dr Lavera Guise Internal medicine

## 2023-02-26 ENCOUNTER — Telehealth: Payer: Self-pay

## 2023-02-26 LAB — COMPREHENSIVE METABOLIC PANEL
ALT: 28 IU/L (ref 0–32)
AST: 29 IU/L (ref 0–40)
Albumin/Globulin Ratio: 1.6 (ref 1.2–2.2)
Albumin: 4.5 g/dL (ref 3.8–4.8)
Alkaline Phosphatase: 90 IU/L (ref 44–121)
BUN/Creatinine Ratio: 12 (ref 12–28)
BUN: 10 mg/dL (ref 8–27)
Bilirubin Total: 0.4 mg/dL (ref 0.0–1.2)
CO2: 25 mmol/L (ref 20–29)
Calcium: 10.5 mg/dL — ABNORMAL HIGH (ref 8.7–10.3)
Chloride: 97 mmol/L (ref 96–106)
Creatinine, Ser: 0.83 mg/dL (ref 0.57–1.00)
Globulin, Total: 2.9 g/dL (ref 1.5–4.5)
Glucose: 129 mg/dL — ABNORMAL HIGH (ref 70–99)
Potassium: 3.5 mmol/L (ref 3.5–5.2)
Sodium: 137 mmol/L (ref 134–144)
Total Protein: 7.4 g/dL (ref 6.0–8.5)
eGFR: 73 mL/min/{1.73_m2} (ref >59)

## 2023-02-26 LAB — VITAMIN D 25 HYDROXY (VIT D DEFICIENCY, FRACTURES): Vit D, 25-Hydroxy: 34.3 ng/mL (ref 30.0–100.0)

## 2023-02-26 LAB — B12 AND FOLATE PANEL
Folate: >20 ng/mL (ref >3.0)
Vitamin B-12: 1138 pg/mL (ref 232–1245)

## 2023-02-26 NOTE — Telephone Encounter (Signed)
-----   Message from Mylinda Latina, PA-C sent at 02/26/2023 12:50 PM EST ----- Please let her know that her B12 is good, vit D is borderline low and should consider supplement OTC. Her calcium is now elevated after being low last time, she should decrease her supplement. Potassium is back to normal now.

## 2023-02-26 NOTE — Telephone Encounter (Signed)
Spoke with patient regarding lab results. 

## 2023-02-27 ENCOUNTER — Other Ambulatory Visit: Payer: Self-pay | Admitting: Physician Assistant

## 2023-02-27 ENCOUNTER — Telehealth: Payer: Self-pay

## 2023-02-27 DIAGNOSIS — R053 Chronic cough: Secondary | ICD-10-CM

## 2023-02-27 MED ORDER — BENZONATATE 200 MG PO CAPS: 200.0000 mg | ORAL_CAPSULE | Freq: Two times a day (BID) | ORAL | 0 refills | Status: DC | PRN

## 2023-02-27 NOTE — Telephone Encounter (Signed)
done 

## 2023-03-14 ENCOUNTER — Telehealth: Payer: Self-pay

## 2023-03-14 ENCOUNTER — Other Ambulatory Visit: Payer: Self-pay

## 2023-03-14 MED ORDER — AZITHROMYCIN 250 MG PO TABS: ORAL_TABLET | ORAL | 0 refills | Status: AC

## 2023-03-14 NOTE — Telephone Encounter (Signed)
Patient called to report persistent cough. Sent Z-Pack per Ander Purpura.

## 2023-03-21 ENCOUNTER — Encounter: Payer: Self-pay | Admitting: Physician Assistant

## 2023-03-21 ENCOUNTER — Ambulatory Visit (INDEPENDENT_AMBULATORY_CARE_PROVIDER_SITE_OTHER): Payer: Medicare HMO | Admitting: Physician Assistant

## 2023-03-21 VITALS — BP 144/84 | HR 70 | Temp 98.9°F | Resp 16 | Ht <= 58 in | Wt 147.2 lb

## 2023-03-21 DIAGNOSIS — I10 Essential (primary) hypertension: Secondary | ICD-10-CM

## 2023-03-21 DIAGNOSIS — R053 Chronic cough: Secondary | ICD-10-CM

## 2023-03-21 MED ORDER — LOSARTAN POTASSIUM 25 MG PO TABS: 25.0000 mg | ORAL_TABLET | Freq: Every day | ORAL | 2 refills | Status: DC

## 2023-03-21 NOTE — Progress Notes (Signed)
[facility] [address], [place] [address]  Internal MEDICINE  Office Visit Note  Patient Name: Cynthia Keith  [id number]  [id number]  Date of Service: 03/21/2023  Chief Complaint  Patient presents with   Cough    Dry cough for a month     HPI Pt is here for a sick visit. -Dry cough for weeks. Did increase lisinopril recently and cough is likely due to this. She has no wheezing, SOB, drainage, or congestion.  -Wil d/c lisinopril and change to losartan. Did discuss possible cross over of symptoms and may need to be adjusted again if not improving still  Current Medication:  Outpatient Encounter Medications as of 03/21/2023  Medication Sig Note   amLODipine (NORVASC) 10 MG tablet TAKE 1 TABLET BY MOUTH EVERY DAY    aspirin EC 81 MG tablet Take 81 mg by mouth daily.    benzonatate (TESSALON) 200 MG capsule Take 1 capsule (200 mg total) by mouth 2 (two) times daily as needed for cough.    chlorpheniramine-HYDROcodone (TUSSIONEX) 10-8 MG/5ML Take 5 mLs by mouth at bedtime as needed for cough.    estradiol (ESTRACE) 0.1 MG/GM vaginal cream Apply one pea-sized amount around the opening of the urethra daily for 2 weeks, then 3 times weekly moving forward.    hydrochlorothiazide (HYDRODIURIL) 12.5 MG tablet Take 12.5 mg by mouth daily.    losartan (COZAAR) 25 MG tablet Take 1 tablet (25 mg total) by mouth daily.    Multiple Vitamins-Minerals (HAIR SKIN & NAILS PO) Take by mouth.    omeprazole (PRILOSEC) 20 MG capsule TAKE 1 CAPSULE BY MOUTH EVERY DAY    ondansetron (ZOFRAN-ODT) 4 MG disintegrating tablet Take 1 tablet (4 mg total) by mouth every 8 (eight) hours as needed for nausea or vomiting.    OVER THE COUNTER MEDICATION Doterra-EO Mega/AlphaCRS/MIcro Plex VM/Deep Blue Veg capsules    rosuvastatin (CRESTOR) 5 MG tablet TAKE 1 TABLET (5 MG TOTAL) BY MOUTH DAILY.    [DISCONTINUED] lisinopril (ZESTRIL) 10 MG tablet Take 1 tablet (10 mg total) by mouth daily.  03/21/2023: dry cough   [DISCONTINUED] oseltamivir (TAMIFLU) 75 MG capsule Take 75 mg by mouth 2 (two) times daily.    potassium chloride (KLOR-CON) 10 MEQ tablet Take 1 tablet (10 mEq total) by mouth daily for 7 days.    No facility-administered encounter medications on file as of 03/21/2023.      Medical History: Past Medical History:  Diagnosis Date   Actinic keratosis 03/15/2021   L post thigh    Basal cell carcinoma 06/21/2021   L alar crease, EDC done 07/31/21   GERD (gastroesophageal reflux disease)    Hyperlipidemia    Hypertension    Malignant essential hypertension 02/26/2017   Plantar fasciitis    Sleep apnea    Urinary tract infection      Vital Signs: BP (!) 144/84   Pulse 70   Temp 98.9 F (37.2 C)   Resp 16   Ht 4\' 8"  (1.422 m)   Wt 147 lb 3.2 oz (66.8 kg)   SpO2 98%   BMI 33.00 kg/m    Review of Systems  Constitutional:  Negative for chills, fatigue and unexpected weight change.  HENT:  Negative for congestion, postnasal drip, rhinorrhea, sneezing and sore throat.   Eyes:  Negative for redness.  Respiratory:  Positive for cough. Negative for chest tightness, shortness of breath and wheezing.   Cardiovascular:  Negative for chest pain and palpitations.  Gastrointestinal:  Negative for abdominal pain, constipation, diarrhea, nausea and vomiting.  Genitourinary:  Negative for dysuria and frequency.  Musculoskeletal:  Negative for arthralgias, back pain, joint swelling and neck pain.  Skin:  Negative for rash.  Neurological: Negative.  Negative for tremors and numbness.  Hematological:  Negative for adenopathy. Does not bruise/bleed easily.  Psychiatric/Behavioral:  Negative for behavioral problems (Depression), sleep disturbance and suicidal ideas. The patient is not nervous/anxious.     Physical Exam Vitals and nursing note reviewed.  Constitutional:      General: She is not in acute distress.    Appearance: Normal appearance. She is well-developed.  She is obese. She is not diaphoretic.  HENT:     Head: Normocephalic and atraumatic.     Mouth/Throat:     Pharynx: No oropharyngeal exudate.  Eyes:     Pupils: Pupils are equal, round, and reactive to light.  Neck:     Thyroid: No thyromegaly.     Vascular: No JVD.     Trachea: No tracheal deviation.  Cardiovascular:     Rate and Rhythm: Normal rate and regular rhythm.     Heart sounds: Normal heart sounds. No murmur heard.    No friction rub. No gallop.  Pulmonary:     Effort: Pulmonary effort is normal. No respiratory distress.     Breath sounds: No wheezing or rales.  Chest:     Chest wall: No tenderness.  Abdominal:     General: Bowel sounds are normal.     Palpations: Abdomen is soft.  Musculoskeletal:        General: Normal range of motion.     Cervical back: Normal range of motion and neck supple.  Lymphadenopathy:     Cervical: No cervical adenopathy.  Skin:    General: Skin is warm and dry.  Neurological:     Mental Status: She is alert and oriented to person, place, and time.     Cranial Nerves: No cranial nerve deficit.  Psychiatric:        Behavior: Behavior normal.        Thought Content: Thought content normal.        Judgment: Judgment normal.       Assessment/Plan: 1. Persistent dry cough Will stop lisinopril and switch to losartan to see if this improves as she has no other symptoms  2. Essential hypertension Continue amlodipine and HCTZ and will add losartan in place of lisinopril - losartan (COZAAR) 25 MG tablet; Take 1 tablet (25 mg total) by mouth daily.  Dispense: 30 tablet; Refill: 2   General Counseling: Cynthia Keith verbalizes understanding of the findings of todays visit and agrees with plan of treatment. I have discussed any further diagnostic evaluation that may be needed or ordered today. We also reviewed her medications today. she has been encouraged to call the office with any questions or concerns that should arise related to todays  visit.    Counseling:    No orders of the defined types were placed in this encounter.   Meds ordered this encounter  Medications   losartan (COZAAR) 25 MG tablet    Sig: Take 1 tablet (25 mg total) by mouth daily.    Dispense:  30 tablet    Refill:  2    Time spent:30 Minutes

## 2023-03-25 ENCOUNTER — Telehealth: Payer: Self-pay

## 2023-03-25 NOTE — Telephone Encounter (Signed)
Patient called to report that she is still coughing. Advised patient that per Ander Purpura, this can be a side effect of Lisinopril and to give it a little more time. Told patient to call back in a few days if still no improvement.

## 2023-03-27 ENCOUNTER — Telehealth: Payer: Self-pay

## 2023-03-28 ENCOUNTER — Other Ambulatory Visit: Payer: Self-pay | Admitting: Physician Assistant

## 2023-03-28 DIAGNOSIS — R053 Chronic cough: Secondary | ICD-10-CM

## 2023-03-28 MED ORDER — BENZONATATE 200 MG PO CAPS: 200.0000 mg | ORAL_CAPSULE | Freq: Two times a day (BID) | ORAL | 0 refills | Status: DC | PRN

## 2023-03-28 NOTE — Telephone Encounter (Signed)
Pt notified  that we order Chest xray and also send tessalon

## 2023-03-29 ENCOUNTER — Ambulatory Visit
Admission: RE | Admit: 2023-03-29 | Discharge: 2023-03-29 | Disposition: A | Discharge status: Home / Self Care | Payer: Medicare HMO | Source: Ambulatory Visit | Attending: Physician Assistant | Admitting: Physician Assistant

## 2023-03-29 DIAGNOSIS — R053 Chronic cough: Secondary | ICD-10-CM

## 2023-03-29 DIAGNOSIS — R059 Cough, unspecified: Secondary | ICD-10-CM | POA: Diagnosis not present

## 2023-04-02 ENCOUNTER — Telehealth: Payer: Self-pay

## 2023-04-02 NOTE — Telephone Encounter (Signed)
Pt advised that she need to stopped lisinopril she was confused she will check and call me back which one she is taking and we can go from there next step  may be need appt

## 2023-04-02 NOTE — Telephone Encounter (Signed)
Please confirm she stopped lisinopril and started losartan. It can sometimes take a month for cough to resolve after stopping the medication. Losartan can also cause this though less often. We can stop both completely and start on hydralazine instead, but again it can take a month for it to resolve after stopping the med. Continue HCTZ and amlodipine as before.

## 2023-04-08 ENCOUNTER — Encounter: Payer: Medicare HMO | Admitting: Dermatology

## 2023-04-11 ENCOUNTER — Encounter: Payer: Self-pay | Admitting: Internal Medicine

## 2023-04-11 ENCOUNTER — Ambulatory Visit: Payer: Medicare HMO | Admitting: Internal Medicine

## 2023-04-11 ENCOUNTER — Telehealth: Payer: Self-pay | Admitting: Internal Medicine

## 2023-04-11 VITALS — BP 160/85 | HR 66 | Temp 98.0°F | Resp 16 | Ht <= 58 in | Wt 149.2 lb

## 2023-04-11 DIAGNOSIS — R0602 Shortness of breath: Secondary | ICD-10-CM | POA: Diagnosis not present

## 2023-04-11 DIAGNOSIS — K219 Gastro-esophageal reflux disease without esophagitis: Secondary | ICD-10-CM

## 2023-04-11 DIAGNOSIS — R053 Chronic cough: Secondary | ICD-10-CM | POA: Diagnosis not present

## 2023-04-11 NOTE — Patient Instructions (Signed)
Cough, Adult A cough helps to clear your throat and lungs. It may be a sign of an illness or another condition. A short-term (acute) cough may last 2-3 weeks. A long-term (chronic) cough may last 8 or more weeks. Many things can cause a cough. They include: Illnesses such as: An infection in your throat or lungs. Asthma or other heart or lung problems. Gastroesophageal reflux. This is when acid comes back up from your stomach. Breathing in things that bother (irritate) your lungs. Allergies. Postnasal drip. This is when mucus runs down the back of your throat. Smoking. Some medicines. Follow these instructions at home: Medicines Take over-the-counter and prescription medicines only as told by your doctor. Talk with your doctor before you take cough medicine (cough suppressants). Eating and drinking Do not drink alcohol. Do not drink caffeine. Drink enough fluid to keep your pee (urine) pale yellow. Lifestyle Stay away from cigarette smoke. Do not smoke or use any products that contain nicotine or tobacco. If you need help quitting, ask your doctor. Stay away from things that make you cough. These may include perfume, candles, cleaning products, or campfire smoke. General instructions  Watch for any changes to your cough. Tell your doctor about them. Always cover your mouth when you cough. If the air is dry in your home, use a cool mist vaporizer or humidifier. If your cough is worse at night, try using extra pillows to raise your head up higher while you sleep. Rest as needed. Contact a doctor if: You have new symptoms. Your symptoms get worse. You cough up pus. You have a fever that does not go away. Your cough does not get better after 2-3 weeks. Cough medicine does not help, and you are not sleeping well. You have pain that gets worse or is not helped with medicine. You are losing weight and do not know why. You have night sweats. Get help right away if: You cough up  blood. You have trouble breathing. Your heart is beating very fast. These symptoms may be an emergency. Get help right away. Call 911. Do not wait to see if the symptoms will go away. Do not drive yourself to the hospital. This information is not intended to replace advice given to you by your health care provider. Make sure you discuss any questions you have with your health care provider. Document Revised: 08/10/2022 Document Reviewed: 08/10/2022 Elsevier Patient Education  2023 [employer]  

## 2023-04-11 NOTE — Telephone Encounter (Signed)
Lvm notifying patient of ugi appointment date, arrival time, location and npo after midnight-Toni

## 2023-04-11 NOTE — Progress Notes (Signed)
[facility] [address] [place], [place] [address]  Pulmonary Sleep Medicine   Office Visit Note  Patient Name: Cynthia MONCEAUX DOB: 1946-05-18 MRN [id number]  Date of Service: 04/11/2023  Complaints/HPI: She has had a cough for about 5 weeks. She states it is a dry cough. She notes that she has had a round of abx. She has history of acid reflux. She does not have asthma. She is a nonsmoker. 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 She is around sprays and other perfumes. She had been on ace inhibitors which have been stopped. She had been on lisinopril and switched to losartan also and was stopped. Patient is not on any inhalers. She denies having chest pain but is having lower back pain. She has no weight loss. Last CXR looks normal. She does note that the cough is worse when she lays down  Office Spirometry Results:     ROS  General: (-) fever, (-) chills, (-) night sweats, (-) weakness Skin: (-) rashes, (-) itching,. Eyes: (-) visual changes, (-) redness, (-) itching. Nose and Sinuses: (-) nasal stuffiness or itchiness, (-) postnasal drip, (-) nosebleeds, (-) sinus trouble. Mouth and Throat: (-) sore throat, (-) hoarseness. Neck: (-) swollen glands, (-) enlarged thyroid, (-) neck pain. Respiratory: + cough, (-) bloody sputum, + shortness of breath, - wheezing. Cardiovascular: - ankle swelling, (-) chest pain. Lymphatic: (-) lymph node enlargement. Neurologic: (-) numbness, (-) tingling. Psychiatric: (-) anxiety, (-) depression   Current Medication: Outpatient Encounter Medications as of 04/11/2023  Medication Sig   amLODipine (NORVASC) 10 MG tablet TAKE 1 TABLET BY MOUTH EVERY DAY   aspirin EC 81 MG tablet Take 81 mg by mouth daily.   benzonatate (TESSALON) 200 MG capsule Take 1 capsule (200 mg total) by mouth 2 (two) times daily as needed for cough.   chlorpheniramine-HYDROcodone (TUSSIONEX) 10-8 MG/5ML Take 5 mLs by mouth at bedtime as needed for cough.    estradiol (ESTRACE) 0.1 MG/GM vaginal cream Apply one pea-sized amount around the opening of the urethra daily for 2 weeks, then 3 times weekly moving forward.   hydrochlorothiazide (HYDRODIURIL) 12.5 MG tablet Take 12.5 mg by mouth daily.   losartan (COZAAR) 25 MG tablet Take 1 tablet (25 mg total) by mouth daily.   Multiple Vitamins-Minerals (HAIR SKIN & NAILS PO) Take by mouth.   omeprazole (PRILOSEC) 20 MG capsule TAKE 1 CAPSULE BY MOUTH EVERY DAY   ondansetron (ZOFRAN-ODT) 4 MG disintegrating tablet Take 1 tablet (4 mg total) by mouth every 8 (eight) hours as needed for nausea or vomiting.   OVER THE COUNTER MEDICATION Doterra-EO Mega/AlphaCRS/MIcro Plex VM/Deep Blue Veg capsules   rosuvastatin (CRESTOR) 5 MG tablet TAKE 1 TABLET (5 MG TOTAL) BY MOUTH DAILY.   potassium chloride (KLOR-CON) 10 MEQ tablet Take 1 tablet (10 mEq total) by mouth daily for 7 days.   No facility-administered encounter medications on file as of 04/11/2023.    Surgical History: Past Surgical History:  Procedure Laterality Date   ABDOMINAL HYSTERECTOMY     COLONOSCOPY WITH PROPOFOL N/A 12/12/2015   Procedure: COLONOSCOPY WITH PROPOFOL;  Surgeon: Scot Jun, MD;  Location: [facility] ENDOSCOPY;  Service: Endoscopy;  Laterality: N/A;   HAND SURGERY  2005    Medical History: Past Medical History:  Diagnosis Date   Actinic keratosis 03/15/2021   L post thigh    Basal cell carcinoma 06/21/2021   L alar crease, EDC done 07/31/21   GERD (gastroesophageal reflux disease)  Hyperlipidemia    Hypertension    Malignant essential hypertension 02/26/2017   Plantar fasciitis    Sleep apnea    Urinary tract infection     Family History: Family History  Problem Relation Age of Onset   Breast cancer Cousin    Stroke Mother    Heart disease Paternal Grandmother    Cancer Paternal Grandfather    Stroke Sister    Stroke Brother     Social History: Social History   Socioeconomic History   Marital status:  Married    Spouse name: Not on file   Number of children: Not on file   Years of education: Not on file   Highest education level: Not on file  Occupational History   Not on file  Tobacco Use   Smoking status: Never   Smokeless tobacco: Never  Vaping Use   Vaping Use: Never used  Substance and Sexual Activity   Alcohol use: Yes    Comment: ocassionally   Drug use: No   Sexual activity: Yes    Birth control/protection: Post-menopausal, Surgical  Other Topics Concern   Not on file  Social History Narrative   Not on file   Social Determinants of Health   Financial Resource Strain: Not on file  Food Insecurity: No Food Insecurity (10/20/2022)   Hunger Vital Sign    Worried About Running Out of Food in the Last Year: Never true    Ran Out of Food in the Last Year: Never true  Transportation Needs: No Transportation Needs (10/20/2022)   PRAPARE - Administrator, Civil Service (Medical): No    Lack of Transportation (Non-Medical): No  Physical Activity: Not on file  Stress: Not on file  Social Connections: Not on file  Intimate Partner Violence: Not At Risk (10/20/2022)   Humiliation, Afraid, Rape, and Kick questionnaire    Fear of Current or Ex-Partner: No    Emotionally Abused: No    Physically Abused: No    Sexually Abused: No    Vital Signs: Blood pressure (!) 160/85, pulse 66, temperature 98 F (36.7 C), resp. rate 16, height 4\' 8"  (1.422 m), weight 149 lb 3.2 oz (67.7 kg), SpO2 97 %.  Examination: General Appearance: The patient is well-developed, well-nourished, and in no distress. Skin: Gross inspection of skin unremarkable. Head: normocephalic, no gross deformities. Eyes: no gross deformities noted. ENT: ears appear grossly normal no exudates. Neck: Supple. No thyromegaly. No LAD. Respiratory: no rhonchi noted. Cardiovascular: Normal S1 and S2 without murmur or rub. Extremities: No cyanosis. pulses are equal. Neurologic: Alert and oriented. No  involuntary movements.  LABS: Recent Results (from the past 2160 hour(s))  CBC     Status: None   Collection Time: 01/16/23 11:37 AM  Result Value Ref Range   WBC 7.7 4.0 - 10.5 K/uL   RBC 4.38 3.87 - 5.11 MIL/uL   Hemoglobin 14.0 12.0 - 15.0 g/dL   HCT 16.1 09.6 - 04.5 %   MCV 91.6 80.0 - 100.0 fL   MCH 32.0 26.0 - 34.0 pg   MCHC 34.9 30.0 - 36.0 g/dL   RDW 40.9 81.1 - 91.4 %   Platelets 252 150 - 400 K/uL   nRBC 0.0 0.0 - 0.2 %    Comment: Performed at [facility], [address] [place], [place] [address]  Urinalysis, Routine w reflex microscopic -Urine, Clean Catch     Status: Abnormal   Collection Time: 01/16/23 11:37 AM  Result Value Ref  Range   Color, Urine STRAW (A) YELLOW   APPearance CLEAR (A) CLEAR   Specific Gravity, Urine 1.003 (L) 1.005 - 1.030   pH 8.0 5.0 - 8.0   Glucose, UA NEGATIVE NEGATIVE mg/dL   Hgb urine dipstick NEGATIVE NEGATIVE   Bilirubin Urine NEGATIVE NEGATIVE   Ketones, ur NEGATIVE NEGATIVE mg/dL   Protein, ur NEGATIVE NEGATIVE mg/dL   Nitrite NEGATIVE NEGATIVE   Leukocytes,Ua NEGATIVE NEGATIVE    Comment: Performed at [facility], [address] [place], [place] [address]  Troponin I (High Sensitivity)     Status: None   Collection Time: 01/16/23 11:37 AM  Result Value Ref Range   Troponin I (High Sensitivity) 3 <18 ng/L    Comment: (NOTE) Elevated high sensitivity troponin I (hsTnI) values and significant  changes across serial measurements may suggest ACS but many other  chronic and acute conditions are known to elevate hsTnI results.  Refer to the Links section for chest pain algorithms and additional  guidance. Performed at [facility], [address] [place], [place] [address]   CBG monitoring, ED     Status: None   Collection Time: 01/16/23 12:01 PM  Result Value Ref Range   Glucose-Capillary 99 70 - 99 mg/dL    Comment: Glucose reference range applies only to samples taken after fasting for at  least 8 hours.  Basic metabolic panel     Status: Abnormal   Collection Time: 01/16/23 12:49 PM  Result Value Ref Range   Sodium 137 135 - 145 mmol/L   Potassium 2.8 (L) 3.5 - 5.1 mmol/L   Chloride 108 98 - 111 mmol/L   CO2 20 (L) 22 - 32 mmol/L   Glucose, Bld 82 70 - 99 mg/dL    Comment: Glucose reference range applies only to samples taken after fasting for at least 8 hours.   BUN 9 8 - 23 mg/dL   Creatinine, Ser 0.98 0.44 - 1.00 mg/dL   Calcium 8.3 (L) 8.9 - 10.3 mg/dL   GFR, Estimated >11 >91 mL/min    Comment: (NOTE) Calculated using the CKD-EPI Creatinine Equation (2021)    Anion gap 9 5 - 15    Comment: Performed at [facility], [address], [place], [place] [address]  Troponin I (High Sensitivity)     Status: None   Collection Time: 01/16/23  1:40 PM  Result Value Ref Range   Troponin I (High Sensitivity) 3 <18 ng/L    Comment: (NOTE) Elevated high sensitivity troponin I (hsTnI) values and significant  changes across serial measurements may suggest ACS but many other  chronic and acute conditions are known to elevate hsTnI results.  Refer to the "Links" section for chest pain algorithms and additional  guidance. Performed at [facility], [address] [place], [place] [address]   JAK2 V617F rfx CALR/MPL/E12-15     Status: None   Collection Time: 01/21/23  9:38 AM  Result Value Ref Range   JAK2 V617F Result Comment     Comment: (NOTE) NEGATIVE The JAK2 V617F mutation is not detected in the provided specimen of this individual. Results should be interpreted in conjunction with clinical and other laboratory findings for the most accurate interpretation. This test was developed and its performance characteristics determined by [facility]. It has not been cleared or approved by the Food and Drug Administration.    Reflex Comment     Comment: (NOTE) Reflex to CALR Mutation Analysis, JAK2 Exon 12-15 Mutation Analysis, and  MPL Mutation Analysis  is indicated.    V617F Rfx CALR/MPL/E12-15 Bkgd Comment     Comment: (NOTE) Molecular testing of blood or bone marrow is useful in the evaluation of suspected myeloproliferative neoplasms (MPN). Mutations in the JAK2, MPL, and CALR genes are present in virtually all MPNs and their presence help distinguish benign reactive processes from clonal neoplasms. These mutations are generally considered mutually exclusive, although concurrent clones have been reported in rare patients. This test will assess for the JAK2V617F (exon 14) mutation first and will reflex to CALR mutation analysis, MPL mutation analysis, and JAK2 exon 12 to 15 mutation analysis if the JAK2V617F mutation is negative. The JAK2 (Janus kinase 2) gene encodes for a non-receptor protein tyrosine kinase that activates cytokine and growth factor signaling. The V617F (c.1849 G>T) mutation results in constitutive activation of JAK2 and downstream STAT5 and ERK signaling. The V617F mutation is observed in approximately 95% of polycythemia vera (PV), 60% of essential thrombocythemi a (ET) and primary myelofibrosis (PMF). It is also infrequently present (3-5%) in myelodysplastic syndrome, chronic myelomonocytic leukemia, and other atypical chronic myeloid disorders. A small percentage of JAK2 mutation positive patients (3.3%) contain other non-V617F mutations within exons 12 to 15. In particular, mutations in exon 12 of JAK2 have been described in approximately 3% of patients with PV. JAK2 allele burden correlates with clinical phenotype, with low levels of mutant allele characterized by thrombocytosis, intermediate levels with erythrocytosis, and high mutant allele burden correlating with enhanced myelopoiesis of the BM, leukocytosis, increasing spleen size, and circulating CD34-positive cells. The CALR (Calreticulin) gene encodes for a multifunctional calcium-binding protein involved in many cellular activities such as  growth, proliferation, adhesion, and programmed cell death. Among patients with JAK2 negative MPNs, CALR are found in approximat ely 70% of patients with JAK2-negative essential thrombocythemia (ET) and 60-88% of patients with JAK2-negative primary myelofibrosis(PMF). Only a minority of patients (approximately 8%) with myelodysplasia have mutations in the CALR gene. CALR mutations are rarely detected in patients with de novo acute myeloid leukemia, chronic myelogenous leukemia, lymphoid leukemia, or solid tumors. CALR mutations are not detected in polycythemia and generally appear to be mutually exclusive with JAK2 mutations and MPL mutations. The majority of mutational changes involve a variety of insertion deletion mutations in exon 9 of the calreticulin gene: approximately 53% of all CALR mutations are a 52 bp deletion (type-1) while the second most prevalent mutation (approximately 32%) contains a 5 bp insertion (type-2). Other mutations (non-type 1 or type 2) are seen in a small minority of cases. CALR mutations in PMF tend to be with a favorable prognosis compared to JAK2 V617F mutations, w hereas primary myelofibrosis negative for CALR, JAK2 V617F and MPL mutations (so-called triple negative) is associated with a poor prognosis and shorter survival. The MPL (myeloproliferative leukemia virus oncogene) gene encodes the thrombopoietin receptor which regulates hematopoiesis and megakaryopoiesis. Activating MPL mutations are associated with a subset of myeloproliferative neoplasms and acute megakaryoblastic leukemia. MPL W515 mutations are present in approximately 5-8% of patients with primary myelofibrosis (PMF) and 1-4% of patients with essential thrombocythemia (ET). The S505 mutation is detected in patients with hereditary thrombocythemia. Limitations This assay has a sensitivity of approximately 1% VAF for JAK2 V617F, 2.5% VAF for other mutations in JAK2 exons 12 to 15,  CALR mutations, and MPL mutations.    Method based next generation sequencing.     Comment: Comment [employer]    References Comment     Comment: (NOTE) Alghasham N, Alnouri Y, Abalkhail H,  Clarita Leber. Detection of mutations in JAK2 exons 12-15 by [employer] sequencing. Int J Lab Hematol. 2016 Feb;38(1):34-41. doi: 10.1111/ijlh.84696. Epub 2015 Sep 11. PMID: 29528413. Pura Spice, Unk Lightning, Hasserjian R, Patrica Duel, Borowitz MJ, Leroy Libman MM, Clio CD, [place] Boro, Vardiman JW. The 2016 revision to the World Health Organization classification of myeloid neoplasms and acute leukemia. Blood. 2016 May 19;127(20):2391-405. doi: 10.1182/blood-2016-03-643544. Epub 2016 Apr 11. PMID: 24401027. [facility], Zhang ZJ, Cary S, Albitar M. Mutation profile of JAK2 transcripts in patients with chronic myeloproliferative neoplasias. J Mol Diagn. 2009 Jan;11(1):49-53.doi: 10.2353/jmoldx.2009.080114. Epub 2008 Dec 12. PMID: 25366440; PMCID: HKV4259563. NCCN Clinical Practice Guidelines in Oncology (NCCN Guidelines) Myeloproliferative Neoplasms Version 3.2022 - August 03, 2021. Swerdlow SH, Programmer, multimedia. WHO classif ication of Tumours of Haematopoietic and Lymphoid Tissues. 4th edn. Jaci Standard, [place]: Geologist, engineering for [employer] on [employer]; 2017. Tefferi A. Primary myelofibrosis: 2021 update on diagnosis, risk-stratification and management. Am J Hematol. 2021 Jan;96(1):145-162. doi: 10.1002/ajh.26050. Epub 2020 Dec 2. PMID: 87564332. Royetta Car, Kralovics R. Genetic basis and molecular pathophysiology of classical myeloproliferative neoplasms. Blood. 2017 Feb 9;129(6):667-679. doi: 10.1182/blood-2016-10-695940. Epub 2016 Dec 27. PMID: 95188416.    Director Review Comment     Comment: (NOTE) Technical Component performed at [employer] performed by: Mellody Life, PhD, Hafa Adai Specialist Group Director, Molecular Oncology [address] [place], [place]  [address] [phone] Performed At: [facility] [address] [place], [place] [id number] Maurine Simmering MDPhD [id number] Performed At: [facility] [address] [place], [place] [id number] Maurine Simmering MDPhD [phone]   Iron and TIBC     Status: None   Collection Time: 01/21/23  9:38 AM  Result Value Ref Range   Iron 113 28 - 170 ug/dL   TIBC 703 500 - 938 ug/dL   Saturation Ratios 28 10.4 - 31.8 %   UIBC 287 ug/dL    Comment: Performed at [facility], [address] [place], [place] [address]  Ferritin     Status: None   Collection Time: 01/21/23  9:38 AM  Result Value Ref Range   Ferritin 94 11 - 307 ng/mL    Comment: Performed at [facility], [address] [place], [place] [address]  CBC with Differential/Platelet     Status: Abnormal   Collection Time: 01/21/23  9:38 AM  Result Value Ref Range   WBC 7.1 4.0 - 10.5 K/uL   RBC 4.56 3.87 - 5.11 MIL/uL   Hemoglobin 14.4 12.0 - 15.0 g/dL   HCT 67.8 93.8 - 10.1 %   MCV 93.0 80.0 - 100.0 fL   MCH 31.6 26.0 - 34.0 pg   MCHC 34.0 30.0 - 36.0 g/dL   RDW 75.1 02.5 - 85.2 %   Platelets 239 150 - 400 K/uL   nRBC 0.0 0.0 - 0.2 %   Neutrophils Relative % 49 %   Neutro Abs 3.5 1.7 - 7.7 K/uL   Lymphocytes Relative 34 %   Lymphs Abs 2.4 0.7 - 4.0 K/uL   Monocytes Relative 10 %   Monocytes Absolute 0.7 0.1 - 1.0 K/uL   Eosinophils Relative 4 %   Eosinophils Absolute 0.3 0.0 - 0.5 K/uL   Basophils Relative 1 %   Basophils Absolute 0.1 0.0 - 0.1 K/uL   Immature Granulocytes 2 %   Abs Immature Granulocytes 0.11 (H) 0.00 - 0.07 K/uL    Comment: Performed at [facility], [address], [place], [place] [address]  CALR +MPL + E12-E15 (reflexed)     Status: None   Collection Time: 01/21/23  9:38 AM  Result Value Ref Range   CALR Result Comment     Comment: (NOTE) NEGATIVE No insertions or deletions were detected within the analyzed region of the calreticulin (CALR) gene. A negative  result does not entirely exclude the possibility of a clonal population carrying CALR gene mutations that are not covered by this assay. Results should be interpreted in conjunction with clinical and laboratory findings for the most accurate interpretation.    MPL Result Comment     Comment: (NOTE) NEGATIVE No MPL mutation was identified in the provided specimen of this individual. Results should be interpreted in conjunction with clinical and other laboratory findings for the most accurate interpretation.    E12-15 Result Comment     Comment: (NOTE) NEGATIVE JAK2 mutations were not detected in exons 12, 13, 14 and 15. This result does not rule out the presence of JAK2 mutation at a level below the detection sensitivity of this assay, the presence of other mutations outside the analyzed region of the JAK2 gene, or the presence of a myeloproliferative or other neoplasm. Result must be correlated with other clinical data for the most accurate diagnosis. Performed At: [facility] [address] [place], [place] [id number] Maurine Simmering MDPhD [id number]   Comprehensive metabolic panel     Status: Abnormal   Collection Time: 02/25/23  2:01 PM  Result Value Ref Range   Glucose 129 (H) 70 - 99 mg/dL   BUN 10 8 - 27 mg/dL   Creatinine, Ser 5.78 0.57 - 1.00 mg/dL   eGFR 73 >46 NG/EXB/2.84   BUN/Creatinine Ratio 12 12 - 28   Sodium 137 134 - 144 mmol/L   Potassium 3.5 3.5 - 5.2 mmol/L   Chloride 97 96 - 106 mmol/L   CO2 25 20 - 29 mmol/L   Calcium 10.5 (H) 8.7 - 10.3 mg/dL   Total Protein 7.4 6.0 - 8.5 g/dL   Albumin 4.5 3.8 - 4.8 g/dL   Globulin, Total 2.9 1.5 - 4.5 g/dL   Albumin/Globulin Ratio 1.6 1.2 - 2.2   Bilirubin Total 0.4 0.0 - 1.2 mg/dL   Alkaline Phosphatase 90 44 - 121 IU/L   AST 29 0 - 40 IU/L   ALT 28 0 - 32 IU/L  B12 and Folate Panel     Status: None   Collection Time: 02/25/23  2:01 PM  Result Value Ref Range   Vitamin B-12 1,138 232 - 1,245 pg/mL    Folate >20.0 >3.0 ng/mL    Comment: A serum folate concentration of less than 3.1 ng/mL is considered to represent clinical deficiency.   VITAMIN D 25 Hydroxy (Vit-D Deficiency, Fractures)     Status: None   Collection Time: 02/25/23  2:01 PM  Result Value Ref Range   Vit D, 25-Hydroxy 34.3 30.0 - 100.0 ng/mL    Comment: Vitamin D deficiency has been defined by the Institute of Medicine and an Endocrine Society practice guideline as a level of serum 25-OH vitamin D less than 20 ng/mL (1,2). The Endocrine Society went on to further define vitamin D insufficiency as a level between 21 and 29 ng/mL (2). 1. IOM (Institute of Medicine). 2010. Dietary reference    intakes for calcium and D. [place]: The    [employer]. 2. Holick MF, Binkley [place], Bischoff-Ferrari HA, et al.    Evaluation, treatment, and prevention of vitamin  D    deficiency: an Endocrine Society clinical practice    guideline. JCEM. 2011 Jul; 96(7):1911-30.     Radiology: DG Chest 2 View  Result Date: 04/01/2023 CLINICAL DATA:  Persistent cough. EXAM: CHEST - 2 VIEW COMPARISON:  Chest radiograph 10/19/2022 FINDINGS: The cardiomediastinal contours are normal. Aortic atherosclerosis. Pulmonary vasculature is normal. No consolidation, pleural effusion, or pneumothorax. No pulmonary mass or visualized nodule. No acute osseous abnormalities are seen. IMPRESSION: No acute chest findings or explanation for cough. Electronically Signed   By: Narda Rutherford M.D.   On: 04/01/2023 16:40    No results found.  DG Chest 2 View  Result Date: 04/01/2023 CLINICAL DATA:  Persistent cough. EXAM: CHEST - 2 VIEW COMPARISON:  Chest radiograph 10/19/2022 FINDINGS: The cardiomediastinal contours are normal. Aortic atherosclerosis. Pulmonary vasculature is normal. No consolidation, pleural effusion, or pneumothorax. No pulmonary mass or visualized nodule. No acute osseous abnormalities are seen. IMPRESSION: No acute chest  findings or explanation for cough. Electronically Signed   By: Narda Rutherford M.D.   On: 04/01/2023 16:40    Assessment and Plan: Patient Active Problem List   Diagnosis Date Noted   Chest pain 10/19/2022   Erythrocytosis 07/24/2022   Fatty liver disease, nonalcoholic 12/26/2020   Encounter for general adult medical examination with abnormal findings 09/04/2020   Hemochromatosis 09/04/2020   Infection 07/05/2020   Chills 07/05/2020   Body aches 07/05/2020   Other fatigue 07/05/2020   Restless legs syndrome 05/13/2019   Screening for osteoporosis 11/19/2018   Recurrent right upper quadrant abdominal pain 11/19/2018   Need for vaccination against Streptococcus pneumoniae using pneumococcal conjugate vaccine 13 05/26/2018   Essential hypertension 01/27/2018   Urinary tract infection with hematuria 01/27/2018   GERD (gastroesophageal reflux disease) 01/13/2018   Leukocytosis 02/27/2017   Hyperglycemia 02/27/2017   Rhabdomyolysis 02/26/2017   Dysuria 06/13/2016   Frequency of urination 06/13/2016   Posterior vaginal wall prolapse 02/21/2016   Vaginal vault prolapse 09/20/2014   Vaginal atrophy 09/20/2014   Acquired cyst of kidney 08/31/2013   Hereditary and idiopathic peripheral neuropathy 08/18/2012   Renal mass, right 08/18/2012    1. Chronic cough Lisinopril stopped may want to consider stopping losartan also - Pulmonary function test; Future - DG UGI W SMALL BOWEL; Future  2. Shortness of breath Likely secondary to COPD will get a evaluation with pulmonary functions to further assess. - Pulmonary function test; Future  3. Gastroesophageal reflux disease without esophagitis Will get a evaluation upper GI has microaspiration may also be contributing to her symptoms of shortness of breath. - DG UGI W SMALL BOWEL; Future   General Counseling: I have discussed the findings of the evaluation and examination with Liborio Nixon.  I have also discussed any further diagnostic  evaluation thatmay be needed or ordered today. Serenah verbalizes understanding of the findings of todays visit. We also reviewed her medications today and discussed drug interactions and side effects including but not limited excessive drowsiness and altered mental states. We also discussed that there is always a risk not just to her but also people around her. she has been encouraged to call the office with any questions or concerns that should arise related to todays visit.  No orders of the defined types were placed in this encounter.    Time spent: 34  I have personally obtained a history, examined the patient, evaluated laboratory and imaging results, formulated the assessment and plan and placed orders.    Yevonne Pax, MD [facility] Pulmonary  and Critical Care Sleep medicine

## 2023-04-16 ENCOUNTER — Encounter: Payer: Self-pay | Admitting: Nurse Practitioner

## 2023-04-16 ENCOUNTER — Ambulatory Visit (INDEPENDENT_AMBULATORY_CARE_PROVIDER_SITE_OTHER): Payer: Medicare HMO | Admitting: Nurse Practitioner

## 2023-04-16 VITALS — BP 150/70 | HR 65 | Temp 96.9°F | Resp 16 | Ht <= 58 in | Wt 167.8 lb

## 2023-04-16 DIAGNOSIS — M5432 Sciatica, left side: Secondary | ICD-10-CM

## 2023-04-16 DIAGNOSIS — M25552 Pain in left hip: Secondary | ICD-10-CM

## 2023-04-16 MED ORDER — CYCLOBENZAPRINE HCL 5 MG PO TABS: 5.0000 mg | ORAL_TABLET | Freq: Every day | ORAL | 0 refills | Status: DC

## 2023-04-16 NOTE — Progress Notes (Signed)
[facility] [address] [place], [place] [address]  Internal MEDICINE  Office Visit Note  Patient Name: Cynthia Keith  [id number]  [id number]  Date of Service: 04/16/2023  Chief Complaint  Patient presents with   Acute Visit    Left hips pain.     HPI Cynthia presents for an acute sick visit for left hip pain --a couple of days ago  Left hip with radiation into the gluteal muscles Sharp deep pain  Denies any burning or tingling  down the leg Denies any injury or fall.      Current Medication:  Outpatient Encounter Medications as of 04/16/2023  Medication Sig   amLODipine (NORVASC) 10 MG tablet TAKE 1 TABLET BY MOUTH EVERY DAY   aspirin EC 81 MG tablet Take 81 mg by mouth daily.   benzonatate (TESSALON) 200 MG capsule Take 1 capsule (200 mg total) by mouth 2 (two) times daily as needed for cough.   chlorpheniramine-HYDROcodone (TUSSIONEX) 10-8 MG/5ML Take 5 mLs by mouth at bedtime as needed for cough.   cyclobenzaprine (FLEXERIL) 5 MG tablet Take 1 tablet (5 mg total) by mouth at bedtime.   estradiol (ESTRACE) 0.1 MG/GM vaginal cream Apply one pea-sized amount around the opening of the urethra daily for 2 weeks, then 3 times weekly moving forward.   hydrochlorothiazide (HYDRODIURIL) 12.5 MG tablet Take 12.5 mg by mouth daily.   losartan (COZAAR) 25 MG tablet Take 1 tablet (25 mg total) by mouth daily.   Multiple Vitamins-Minerals (HAIR SKIN & NAILS PO) Take by mouth.   omeprazole (PRILOSEC) 20 MG capsule TAKE 1 CAPSULE BY MOUTH EVERY DAY   ondansetron (ZOFRAN-ODT) 4 MG disintegrating tablet Take 1 tablet (4 mg total) by mouth every 8 (eight) hours as needed for nausea or vomiting.   OVER THE COUNTER MEDICATION Doterra-EO Mega/AlphaCRS/MIcro Plex VM/Deep Blue Veg capsules   rosuvastatin (CRESTOR) 5 MG tablet TAKE 1 TABLET (5 MG TOTAL) BY MOUTH DAILY.   potassium chloride (KLOR-CON) 10 MEQ tablet Take 1 tablet (10 mEq total) by mouth daily for 7 days.   No  facility-administered encounter medications on file as of 04/16/2023.      Medical History: Past Medical History:  Diagnosis Date   Actinic keratosis 03/15/2021   L post thigh    Basal cell carcinoma 06/21/2021   L alar crease, EDC done 07/31/21   GERD (gastroesophageal reflux disease)    Hyperlipidemia    Hypertension    Malignant essential hypertension 02/26/2017   Plantar fasciitis    Sleep apnea    Urinary tract infection      Vital Signs: BP (!) 150/70 Comment: 161/74  Pulse 65   Temp (!) 96.9 F (36.1 C)   Resp 16   Ht  (1.422 m)   Wt 167 lb 12.8 oz (76.1 kg)   SpO2 99%   BMI 37.62 kg/m    Review of Systems  Constitutional: Negative.  Negative for fatigue.  Respiratory: Negative.  Negative for cough, chest tightness, shortness of breath and wheezing.   Cardiovascular: Negative.  Negative for chest pain and palpitations.  Musculoskeletal:  Positive for arthralgias, back pain and myalgias.    Physical Exam Vitals reviewed.  Constitutional:      General: She is not in acute distress.    Appearance: Normal appearance. She is obese. She is not ill-appearing.  HENT:     Head: Normocephalic and atraumatic.  Eyes:     Pupils: Pupils are equal, round, and reactive to  light.  Cardiovascular:     Rate and Rhythm: Normal rate and regular rhythm.  Pulmonary:     Effort: Pulmonary effort is normal. No respiratory distress.  Musculoskeletal:     Left hip: No tenderness. Decreased range of motion.  Neurological:     Mental Status: She is alert and oriented to person, place, and time.  Psychiatric:        Mood and Affect: Mood normal.        Behavior: Behavior normal.       Assessment/Plan: 1. Acute hip pain, left Continue warm compress, stretches and rest.  Also may take prn muscle relaxant as prescribed - cyclobenzaprine (FLEXERIL) 5 MG tablet; Take 1 tablet (5 mg total) by mouth at bedtime.  Dispense: 30 tablet; Refill: 0  2. Left sided  sciatica Continue discussed intervention and take prn muscle relaxant as prescribed.  - cyclobenzaprine (FLEXERIL) 5 MG tablet; Take 1 tablet (5 mg total) by mouth at bedtime.  Dispense: 30 tablet; Refill: 0   General Counseling: Keith verbalizes understanding of the findings of todays visit and agrees with plan of treatment. I have discussed any further diagnostic evaluation that may be needed or ordered today. We also reviewed her medications today. she has been encouraged to call the office with any questions or concerns that should arise related to todays visit.    Counseling:    No orders of the defined types were placed in this encounter.   Meds ordered this encounter  Medications   cyclobenzaprine (FLEXERIL) 5 MG tablet    Sig: Take 1 tablet (5 mg total) by mouth at bedtime.    Dispense:  30 tablet    Refill:  0    Return if symptoms worsen or fail to improve.  [place] Controlled Substance Database was reviewed by me for overdose risk score (ORS)  Time spent:30 Minutes Time spent with patient included reviewing progress notes, labs, imaging studies, and discussing plan for follow up.   This patient was seen by Sallyanne Kuster, FNP-C in collaboration with Dr. Beverely Risen as a part of collaborative care agreement.  Burnis Halling R. Tedd Sias, MSN, FNP-C Internal Medicine

## 2023-04-22 ENCOUNTER — Other Ambulatory Visit: Payer: Self-pay | Admitting: Internal Medicine

## 2023-04-22 ENCOUNTER — Ambulatory Visit
Admission: RE | Admit: 2023-04-22 | Discharge: 2023-04-22 | Disposition: A | Discharge status: Home / Self Care | Payer: Medicare HMO | Source: Ambulatory Visit | Attending: Internal Medicine | Admitting: Internal Medicine

## 2023-04-22 DIAGNOSIS — R053 Chronic cough: Secondary | ICD-10-CM | POA: Diagnosis not present

## 2023-04-22 DIAGNOSIS — K219 Gastro-esophageal reflux disease without esophagitis: Secondary | ICD-10-CM | POA: Diagnosis not present

## 2023-04-22 DIAGNOSIS — H18593 Other hereditary corneal dystrophies, bilateral: Secondary | ICD-10-CM | POA: Diagnosis not present

## 2023-04-22 DIAGNOSIS — K449 Diaphragmatic hernia without obstruction or gangrene: Secondary | ICD-10-CM | POA: Diagnosis not present

## 2023-04-22 DIAGNOSIS — R0602 Shortness of breath: Secondary | ICD-10-CM

## 2023-04-22 DIAGNOSIS — Z961 Presence of intraocular lens: Secondary | ICD-10-CM | POA: Diagnosis not present

## 2023-04-24 ENCOUNTER — Ambulatory Visit (INDEPENDENT_AMBULATORY_CARE_PROVIDER_SITE_OTHER): Payer: Medicare HMO | Admitting: Internal Medicine

## 2023-04-24 DIAGNOSIS — R053 Chronic cough: Secondary | ICD-10-CM

## 2023-04-24 DIAGNOSIS — R0602 Shortness of breath: Secondary | ICD-10-CM

## 2023-04-25 DIAGNOSIS — M5416 Radiculopathy, lumbar region: Secondary | ICD-10-CM | POA: Diagnosis not present

## 2023-04-30 ENCOUNTER — Other Ambulatory Visit: Payer: Self-pay | Admitting: Physician Assistant

## 2023-04-30 ENCOUNTER — Telehealth: Payer: Self-pay

## 2023-04-30 DIAGNOSIS — M5432 Sciatica, left side: Secondary | ICD-10-CM

## 2023-04-30 MED ORDER — GABAPENTIN 100 MG PO CAPS: 100.0000 mg | ORAL_CAPSULE | Freq: Three times a day (TID) | ORAL | 1 refills | Status: DC | PRN

## 2023-04-30 NOTE — Procedures (Signed)
[facility] [address] [place], [address]    Complete Pulmonary Function Testing Interpretation:  FINDINGS:  Forced vital capacity is normal FEV1 is normal FEV1 FVC ratio was normal.  Postbronchodilator no significant change in FEV1 is noted.  Total lung capacity is normal.  Residual volume is decreased.  FRC was decreased.  Residual volume total lung passive ratio was decreased.  DLCO is mildly decreased.  IMPRESSION:  The spirometry is within normal limits lung volumes may be suggestive of a mild restrictive component however clinical correlation is strongly recommended.  Yevonne Pax, MD [facility] Pulmonary Critical Care Medicine Sleep Medicine

## 2023-05-01 NOTE — Telephone Encounter (Signed)
Pt.notified

## 2023-05-15 ENCOUNTER — Other Ambulatory Visit: Payer: Self-pay | Admitting: Physician Assistant

## 2023-05-16 ENCOUNTER — Ambulatory Visit (INDEPENDENT_AMBULATORY_CARE_PROVIDER_SITE_OTHER): Payer: Medicare HMO | Admitting: Physician Assistant

## 2023-05-16 ENCOUNTER — Encounter: Payer: Self-pay | Admitting: Physician Assistant

## 2023-05-16 ENCOUNTER — Telehealth: Payer: Self-pay | Admitting: Physician Assistant

## 2023-05-16 VITALS — BP 145/70 | HR 79 | Temp 97.9°F | Resp 16 | Ht <= 58 in | Wt 150.6 lb

## 2023-05-16 DIAGNOSIS — R413 Other amnesia: Secondary | ICD-10-CM

## 2023-05-16 DIAGNOSIS — R053 Chronic cough: Secondary | ICD-10-CM

## 2023-05-16 DIAGNOSIS — M5432 Sciatica, left side: Secondary | ICD-10-CM | POA: Diagnosis not present

## 2023-05-16 DIAGNOSIS — I10 Essential (primary) hypertension: Secondary | ICD-10-CM | POA: Diagnosis not present

## 2023-05-16 DIAGNOSIS — R7309 Other abnormal glucose: Secondary | ICD-10-CM | POA: Diagnosis not present

## 2023-05-16 NOTE — Telephone Encounter (Signed)
Awaiting 05/16/23 office notes for Neurology referral-Toni

## 2023-05-16 NOTE — Progress Notes (Signed)
[facility] [address] [place], [place] [address]  Internal MEDICINE  Office Visit Note  Patient Name: Cynthia Keith  [id number]  [id number]  Date of Service: 05/16/2023  Chief Complaint  Patient presents with   Acute Visit   Cough   Memory Loss     HPI Pt is here for a sick visit. -pt continues to have memory concerns. Gets lost driving places and mixed up medication instructions. Family has expressed concerns about her memory as well. Does have Fhx of dementia. Labs have been ok. Will go ahead and refer to neurology for further evaluation and treatment -Pt continues to have dry cough, no other symptoms. Has had ABX, cough medications, chest xray, and has seen pulmonology, and most likely cause thought to be her ACEI (Lisinopril) and was discontinued and switched to losartan. Pt advised it can take weeks for S/E to subside after discontinuing and that ARBs can have similar side effect -Pt brought list of all meds she has been taking and upon review discovered she has been taking lisinopril still along with losartan and is likely why she continues to cough. She does want to trial losartan on its own without ACEI now. Crossed off her list and instructions written out to ensure appropriate medications now. -Advised to monitor BP at home and bring log next visit -left side sciatica still and is taking gabapentin BID and puts topical deepblue on and seems to help. Will follow up with ortho if needed -will decrease supplement to lower calcium, and add separate vit D supplement to increase this. Will recheck labs before 1 month follow up  Current Medication:  Outpatient Encounter Medications as of 05/16/2023  Medication Sig   amLODipine (NORVASC) 10 MG tablet TAKE 1 TABLET BY MOUTH EVERY DAY   aspirin EC 81 MG tablet Take 81 mg by mouth daily.   benzonatate (TESSALON) 200 MG capsule Take 1 capsule (200 mg total) by mouth 2 (two) times daily as needed for cough.    chlorpheniramine-HYDROcodone (TUSSIONEX) 10-8 MG/5ML Take 5 mLs by mouth at bedtime as needed for cough.   cyclobenzaprine (FLEXERIL) 5 MG tablet Take 1 tablet (5 mg total) by mouth at bedtime.   estradiol (ESTRACE) 0.1 MG/GM vaginal cream Apply one pea-sized amount around the opening of the urethra daily for 2 weeks, then 3 times weekly moving forward.   gabapentin (NEURONTIN) 100 MG capsule Take 1 capsule (100 mg total) by mouth 3 (three) times daily as needed.   hydrochlorothiazide (HYDRODIURIL) 12.5 MG tablet Take 12.5 mg by mouth daily.   losartan (COZAAR) 25 MG tablet Take 1 tablet (25 mg total) by mouth daily.   Multiple Vitamins-Minerals (HAIR SKIN & NAILS PO) Take by mouth.   omeprazole (PRILOSEC) 20 MG capsule TAKE 1 CAPSULE BY MOUTH EVERY DAY   ondansetron (ZOFRAN-ODT) 4 MG disintegrating tablet Take 1 tablet (4 mg total) by mouth every 8 (eight) hours as needed for nausea or vomiting.   OVER THE COUNTER MEDICATION Doterra-EO Mega/AlphaCRS/MIcro Plex VM/Deep Blue Veg capsules   rosuvastatin (CRESTOR) 5 MG tablet TAKE 1 TABLET (5 MG TOTAL) BY MOUTH DAILY.   potassium chloride (KLOR-CON) 10 MEQ tablet Take 1 tablet (10 mEq total) by mouth daily for 7 days.   No facility-administered encounter medications on file as of 05/16/2023.      Medical History: Past Medical History:  Diagnosis Date   Actinic keratosis 03/15/2021   L post thigh    Basal cell carcinoma 06/21/2021   L  alar crease, EDC done 07/31/21   GERD (gastroesophageal reflux disease)    Hyperlipidemia    Hypertension    Malignant essential hypertension 02/26/2017   Plantar fasciitis    Sleep apnea    Urinary tract infection      Vital Signs: BP (!) 145/70   Pulse 79   Temp 97.9 F (36.6 C)   Resp 16   Ht 4\' 8"  (1.422 m)   Wt 150 lb 9.6 oz (68.3 kg)   SpO2 99%   BMI 33.76 kg/m    Review of Systems  Constitutional:  Negative for chills, fatigue and unexpected weight change.  HENT:  Negative for congestion,  postnasal drip, rhinorrhea, sneezing and sore throat.   Eyes:  Negative for redness.  Respiratory:  Positive for cough. Negative for chest tightness, shortness of breath and wheezing.   Cardiovascular:  Negative for chest pain and palpitations.  Gastrointestinal:  Negative for abdominal pain, constipation, diarrhea, nausea and vomiting.  Genitourinary:  Negative for dysuria and frequency.  Musculoskeletal:  Positive for back pain. Negative for arthralgias, joint swelling and neck pain.  Skin:  Negative for rash.  Neurological: Negative.  Negative for tremors and numbness.  Hematological:  Negative for adenopathy. Does not bruise/bleed easily.  Psychiatric/Behavioral:  Positive for confusion. Negative for behavioral problems (Depression), sleep disturbance and suicidal ideas. The patient is not nervous/anxious.        Memory loss    Physical Exam Vitals and nursing note reviewed.  Constitutional:      General: She is not in acute distress.    Appearance: Normal appearance. She is well-developed. She is obese. She is not diaphoretic.  HENT:     Head: Normocephalic and atraumatic.     Mouth/Throat:     Pharynx: No oropharyngeal exudate.  Eyes:     Pupils: Pupils are equal, round, and reactive to light.  Neck:     Thyroid: No thyromegaly.     Vascular: No JVD.     Trachea: No tracheal deviation.  Cardiovascular:     Rate and Rhythm: Normal rate and regular rhythm.     Heart sounds: Normal heart sounds. No murmur heard.    No friction rub. No gallop.  Pulmonary:     Effort: Pulmonary effort is normal. No respiratory distress.     Breath sounds: No wheezing or rales.  Chest:     Chest wall: No tenderness.  Abdominal:     General: Bowel sounds are normal.     Palpations: Abdomen is soft.  Musculoskeletal:        General: Normal range of motion.     Cervical back: Normal range of motion and neck supple.  Lymphadenopathy:     Cervical: No cervical adenopathy.  Skin:    General:  Skin is warm and dry.  Neurological:     Mental Status: She is alert and oriented to person, place, and time.     Cranial Nerves: No cranial nerve deficit.  Psychiatric:        Behavior: Behavior normal.        Thought Content: Thought content normal.        Judgment: Judgment normal.       Assessment/Plan: 1. Persistent dry cough Will actually d/c lisinopril now and continue losartan instead for 1 month trial to see if cough improving. If not, may need to switch away from ACEI and ARB  2. Memory loss Will refer to neurology given continued memory concerns - Ambulatory referral to  Neurology  3. Essential hypertension Will log BP daily and bring next visit  4. Serum calcium elevated Decrease supplement and will recheck labs - Comprehensive metabolic panel  5. Elevated glucose - Hgb A1C w/o eAG  6. Left sided sciatica May continue gabapentin as before and follow up with ortho as needed   General Counseling: amanee to understanding of the findings of todays visit and agrees with plan of treatment. I have discussed any further diagnostic evaluation that may be needed or ordered today. We also reviewed her medications today. she has been encouraged to call the office with any questions or concerns that should arise related to todays visit.    Counseling:    Orders Placed This Encounter  Procedures   Comprehensive metabolic panel   Hgb A1C w/o eAG   Ambulatory referral to Neurology    No orders of the defined types were placed in this encounter.   Time spent:30 Minutes

## 2023-05-21 ENCOUNTER — Telehealth: Payer: Self-pay | Admitting: Physician Assistant

## 2023-05-21 NOTE — Telephone Encounter (Signed)
Neurology referral sent via [facility] to [facility]-Toni 

## 2023-05-24 ENCOUNTER — Other Ambulatory Visit: Payer: Self-pay | Admitting: Physician Assistant

## 2023-05-24 ENCOUNTER — Telehealth: Payer: Self-pay

## 2023-05-24 DIAGNOSIS — I10 Essential (primary) hypertension: Secondary | ICD-10-CM

## 2023-05-24 MED ORDER — HYDRALAZINE HCL 10 MG PO TABS: 10.0000 mg | ORAL_TABLET | Freq: Three times a day (TID) | ORAL | 1 refills | Status: DC

## 2023-05-24 NOTE — Telephone Encounter (Signed)
Done by kiran  

## 2023-05-24 NOTE — Telephone Encounter (Signed)
Patient called to report that she is still coughing. Per Leotis Shames, patient is to stop Losartan and Lisinopril. Lauren is sending Hydralazine to patient's pharmacy. Patient is to continue Amlodipine and HCTZ. Also advised patient's daughter of these changes. Will send patient an updated list of medications for clarification.

## 2023-05-27 ENCOUNTER — Other Ambulatory Visit: Payer: Self-pay

## 2023-05-30 ENCOUNTER — Other Ambulatory Visit: Payer: Self-pay | Admitting: Physician Assistant

## 2023-05-30 DIAGNOSIS — I10 Essential (primary) hypertension: Secondary | ICD-10-CM

## 2023-05-30 NOTE — Telephone Encounter (Signed)
Please review and send 

## 2023-06-03 ENCOUNTER — Ambulatory Visit: Payer: Medicare HMO | Admitting: Physician Assistant

## 2023-06-03 ENCOUNTER — Ambulatory Visit: Payer: Medicare HMO

## 2023-06-03 DIAGNOSIS — R413 Other amnesia: Secondary | ICD-10-CM | POA: Diagnosis not present

## 2023-06-03 DIAGNOSIS — Z114 Encounter for screening for human immunodeficiency virus [HIV]: Secondary | ICD-10-CM | POA: Diagnosis not present

## 2023-06-03 NOTE — Progress Notes (Signed)
Patient and daughter came in today to confirm that the correct medication is being taken. I confirmed all meds are correct. Patient will start taking Allegra daily to see if this improves cough. Patient has follow up appointment in 2 weeks with Lauren.

## 2023-06-12 ENCOUNTER — Telehealth: Payer: Self-pay | Admitting: Physician Assistant

## 2023-06-12 NOTE — Telephone Encounter (Signed)
Lvm to see if patient will have labs done before 06/17/23 follow up-Cynthia Keith

## 2023-06-13 ENCOUNTER — Telehealth: Payer: Self-pay | Admitting: Physician Assistant

## 2023-06-13 DIAGNOSIS — R7309 Other abnormal glucose: Secondary | ICD-10-CM | POA: Diagnosis not present

## 2023-06-13 NOTE — Telephone Encounter (Signed)
Neurology appointment 06/03/23/24 @ [facility]

## 2023-06-14 DIAGNOSIS — S0990XA Unspecified injury of head, initial encounter: Secondary | ICD-10-CM | POA: Diagnosis not present

## 2023-06-14 LAB — COMPREHENSIVE METABOLIC PANEL
ALT: 29 IU/L (ref 0–32)
AST: 32 IU/L (ref 0–40)
Albumin: 4.4 g/dL (ref 3.8–4.8)
Alkaline Phosphatase: 94 IU/L (ref 44–121)
BUN/Creatinine Ratio: 14 (ref 12–28)
BUN: 9 mg/dL (ref 8–27)
Bilirubin Total: 0.4 mg/dL (ref 0.0–1.2)
CO2: 28 mmol/L (ref 20–29)
Calcium: 10.4 mg/dL — ABNORMAL HIGH (ref 8.7–10.3)
Chloride: 100 mmol/L (ref 96–106)
Creatinine, Ser: 0.64 mg/dL (ref 0.57–1.00)
Globulin, Total: 2.8 g/dL (ref 1.5–4.5)
Glucose: 106 mg/dL — ABNORMAL HIGH (ref 70–99)
Potassium: 3.8 mmol/L (ref 3.5–5.2)
Sodium: 141 mmol/L (ref 134–144)
Total Protein: 7.2 g/dL (ref 6.0–8.5)
eGFR: 92 mL/min/{1.73_m2} (ref >59)

## 2023-06-14 LAB — HGB A1C W/O EAG: Hgb A1c MFr Bld: 5.9 % — ABNORMAL HIGH (ref 4.8–5.6)

## 2023-06-15 ENCOUNTER — Other Ambulatory Visit: Payer: Self-pay | Admitting: Physician Assistant

## 2023-06-15 DIAGNOSIS — I10 Essential (primary) hypertension: Secondary | ICD-10-CM

## 2023-06-17 ENCOUNTER — Encounter: Payer: Self-pay | Admitting: Physician Assistant

## 2023-06-17 ENCOUNTER — Telehealth: Payer: Self-pay

## 2023-06-17 ENCOUNTER — Ambulatory Visit (INDEPENDENT_AMBULATORY_CARE_PROVIDER_SITE_OTHER): Payer: Medicare HMO | Admitting: Physician Assistant

## 2023-06-17 VITALS — BP 125/70 | HR 70 | Temp 97.9°F | Resp 16 | Ht <= 58 in | Wt 149.6 lb

## 2023-06-17 DIAGNOSIS — R053 Chronic cough: Secondary | ICD-10-CM | POA: Diagnosis not present

## 2023-06-17 DIAGNOSIS — R413 Other amnesia: Secondary | ICD-10-CM

## 2023-06-17 DIAGNOSIS — I10 Essential (primary) hypertension: Secondary | ICD-10-CM | POA: Diagnosis not present

## 2023-06-17 NOTE — Telephone Encounter (Signed)
-----   Message from Carlean Jews, PA-C sent at 06/17/2023  1:01 PM EDT ----- Please let patient know that her calcium is still up slightly but improving and to decrease her supplements with calcium in them. Also she is now in prediabetic range and will need to monitor and should work to reduce sugar intake

## 2023-06-17 NOTE — Progress Notes (Signed)
[facility] [address] [place], [place] [address]  Internal MEDICINE  Office Visit Note  Patient Name: Cynthia Keith  [id number]  [id number]  Date of Service: 06/26/2023  Chief Complaint  Patient presents with   Follow-up   Gastroesophageal Reflux   Hypertension   Hyperlipidemia    HPI Pt is here for routine follow up, her daughter is with her -Did have a fall last week and hit her head but did not have any LOC. She was checked out at walk in clinic and told she was good to go. Has not had any headache since. States she tripped on mat/rug at work -BP stable -Cough is a little better, but still having coughing spells mainly at night -Is taking omeprazole  -not having any burning or heartburn, discussed this can be a cause of coughing and to continue omeprazole. She does also have a known hiatal hernia which may contribute though denies any pain or nausea. -Taking meds as prescribed now and has been off ACEI/ARB for 3 weeks. Discussed cough S/E can also take awhile to subside completely. She has a pulmonary follow up next month as well and will keep this appt -Of note patient had no coughing spells throughout visit -Taking allegra -some mood changes along with memory changes over time. Seeing neurology now and they are monitoring. Daughter reports that patient does not readily admit to memory or mood changes, however in office today she does acknowledge that her memory has declined. 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 She continues to work full time in [employer]. She also had trouble with her medications when her BP meds were being changed and continued to take medication that had been stopped due to coughing S/E. She was therefore scheduled to come in for nurse visit to review meds and given new med list with instructions with daughter present. BP greatly improved today  Current  Medication: Outpatient Encounter Medications as of 06/17/2023  Medication Sig   amLODipine (NORVASC) 10 MG tablet TAKE 1 TABLET BY MOUTH EVERY DAY   aspirin EC 81 MG tablet Take 81 mg by mouth daily.   benzonatate (TESSALON) 200 MG capsule Take 1 capsule (200 mg total) by mouth 2 (two) times daily as needed for cough.   chlorpheniramine-HYDROcodone (TUSSIONEX) 10-8 MG/5ML Take 5 mLs by mouth at bedtime as needed for cough.   cyclobenzaprine (FLEXERIL) 5 MG tablet Take 1 tablet (5 mg total) by mouth at bedtime.   estradiol (ESTRACE) 0.1 MG/GM vaginal cream Apply one pea-sized amount around the opening of the urethra daily for 2 weeks, then 3 times weekly moving forward.   gabapentin (NEURONTIN) 100 MG capsule Take 1 capsule (100 mg total) by mouth 3 (three) times daily as needed.   hydrochlorothiazide (HYDRODIURIL) 12.5 MG tablet TAKE 1 TABLET BY MOUTH EVERY DAY   Multiple Vitamins-Minerals (HAIR SKIN & NAILS PO) Take by mouth.   omeprazole (PRILOSEC) 20 MG capsule TAKE 1 CAPSULE BY MOUTH EVERY DAY   ondansetron (ZOFRAN-ODT) 4 MG disintegrating tablet Take 1 tablet (4 mg total) by mouth every 8 (eight) hours as needed for nausea or vomiting.   OVER THE COUNTER MEDICATION Doterra-EO Mega/AlphaCRS/MIcro Plex VM/Deep Blue Veg capsules   rosuvastatin (CRESTOR) 5 MG tablet TAKE 1 TABLET (5 MG TOTAL) BY MOUTH DAILY.   [DISCONTINUED] hydrALAZINE (APRESOLINE) 10 MG tablet Take 1 tablet (10 mg total) by mouth 3 (three) times daily.  potassium chloride (KLOR-CON) 10 MEQ tablet Take 1 tablet (10 mEq total) by mouth daily for 7 days.   No facility-administered encounter medications on file as of 06/17/2023.    Surgical History: Past Surgical History:  Procedure Laterality Date   ABDOMINAL HYSTERECTOMY     COLONOSCOPY WITH PROPOFOL N/A 12/12/2015   Procedure: COLONOSCOPY WITH PROPOFOL;  Surgeon: Scot Jun, MD;  Location: [facility] ENDOSCOPY;  Service: Endoscopy;  Laterality: N/A;   HAND SURGERY  2005     Medical History: Past Medical History:  Diagnosis Date   Actinic keratosis 03/15/2021   L post thigh    Basal cell carcinoma 06/21/2021   L alar crease, EDC done 07/31/21   GERD (gastroesophageal reflux disease)    Hyperlipidemia    Hypertension    Malignant essential hypertension 02/26/2017   Plantar fasciitis    Sleep apnea    Urinary tract infection     Family History: Family History  Problem Relation Age of Onset   Breast cancer Cousin    Stroke Mother    Heart disease Paternal Grandmother    Cancer Paternal Grandfather    Stroke Sister    Stroke Brother     Social History   Socioeconomic History   Marital status: Married    Spouse name: Not on file   Number of children: Not on file   Years of education: Not on file   Highest education level: Not on file  Occupational History   Not on file  Tobacco Use   Smoking status: Never   Smokeless tobacco: Never  Vaping Use   Vaping Use: Never used  Substance and Sexual Activity   Alcohol use: Yes    Comment: ocassionally   Drug use: No   Sexual activity: Yes    Birth control/protection: Post-menopausal, Surgical  Other Topics Concern   Not on file  Social History Narrative   Not on file   Social Determinants of Health   Financial Resource Strain: Not on file  Food Insecurity: No Food Insecurity (10/20/2022)   Hunger Vital Sign    Worried About Running Out of Food in the Last Year: Never true    Ran Out of Food in the Last Year: Never true  Transportation Needs: No Transportation Needs (10/20/2022)   PRAPARE - Administrator, Civil Service (Medical): No    Lack of Transportation (Non-Medical): No  Physical Activity: Not on file  Stress: Not on file  Social Connections: Not on file  Intimate Partner Violence: Not At Risk (10/20/2022)   Humiliation, Afraid, Rape, and Kick questionnaire    Fear of Current or Ex-Partner: No    Emotionally Abused: No    Physically Abused: No    Sexually  Abused: No      Review of Systems  Constitutional:  Negative for chills, fatigue and unexpected weight change.  HENT:  Negative for congestion, postnasal drip, rhinorrhea, sneezing and sore throat.   Eyes:  Negative for redness.  Respiratory:  Positive for cough. Negative for chest tightness, shortness of breath and wheezing.   Cardiovascular:  Negative for chest pain and palpitations.  Gastrointestinal:  Negative for abdominal pain, constipation, diarrhea, nausea and vomiting.  Genitourinary:  Negative for dysuria and frequency.  Musculoskeletal:  Negative for arthralgias, joint swelling and neck pain.  Skin:  Negative for rash.  Neurological: Negative.  Negative for tremors and numbness.  Hematological:  Negative for adenopathy. Does not bruise/bleed easily.  Psychiatric/Behavioral:  Positive for confusion. Negative for  behavioral problems (Depression), sleep disturbance and suicidal ideas. The patient is not nervous/anxious.        Memory loss    Vital Signs: BP 125/70   Pulse 70   Temp 97.9 F (36.6 C)   Resp 16   Ht 4\' 8"  (1.422 m)   Wt 149 lb 9.6 oz (67.9 kg)   SpO2 99%   BMI 33.54 kg/m    Physical Exam Vitals and nursing note reviewed.  Constitutional:      General: She is not in acute distress.    Appearance: Normal appearance. She is well-developed. She is obese. She is not diaphoretic.  HENT:     Head: Normocephalic and atraumatic.     Mouth/Throat:     Pharynx: No oropharyngeal exudate.  Eyes:     Pupils: Pupils are equal, round, and reactive to light.  Neck:     Thyroid: No thyromegaly.     Vascular: No JVD.     Trachea: No tracheal deviation.  Cardiovascular:     Rate and Rhythm: Normal rate and regular rhythm.     Heart sounds: Normal heart sounds. No murmur heard.    No friction rub. No gallop.  Pulmonary:     Effort: Pulmonary effort is normal. No respiratory distress.     Breath sounds: No wheezing or rales.  Chest:     Chest wall: No  tenderness.  Abdominal:     General: Bowel sounds are normal.     Palpations: Abdomen is soft.  Musculoskeletal:        General: Normal range of motion.     Cervical back: Normal range of motion and neck supple.  Lymphadenopathy:     Cervical: No cervical adenopathy.  Skin:    General: Skin is warm and dry.  Neurological:     Mental Status: She is alert and oriented to person, place, and time.     Cranial Nerves: No cranial nerve deficit.  Psychiatric:        Behavior: Behavior normal.        Thought Content: Thought content normal.        Judgment: Judgment normal.        Assessment/Plan: 1. Essential hypertension Greatly improved and will continue current medication  2. Persistent dry cough Improving some, though still coughing, mainly at night. Stopped all ACEI/ARB 3 weeks ago as likely cause. Does also have a hiatal hernia that may contribute. Continue to monitor and will follow up with pulmonology next month as planned. Did also discuss previous OSA finding and patient declining CPAP and may need to revisit  3. Memory loss Followed by neurology   General Counseling: Trudi Ida understanding of the findings of todays visit and agrees with plan of treatment. I have discussed any further diagnostic evaluation that may be needed or ordered today. We also reviewed her medications today. she has been encouraged to call the office with any questions or concerns that should arise related to todays visit.    No orders of the defined types were placed in this encounter.   No orders of the defined types were placed in this encounter.   This patient was seen by Lynn Ito, PA-C in collaboration with Dr. Beverely Risen as a part of collaborative care agreement.   Total time spent:30 Minutes Time spent includes review of chart, medications, test results, and follow up plan with the patient.      Dr Lyndon Code Internal medicine

## 2023-06-17 NOTE — Telephone Encounter (Signed)
Spoke with patient regarding lab results. 

## 2023-06-24 LAB — PULMONARY FUNCTION TEST

## 2023-07-15 ENCOUNTER — Encounter: Payer: Self-pay | Admitting: Internal Medicine

## 2023-07-15 ENCOUNTER — Ambulatory Visit: Payer: Medicare HMO | Admitting: Internal Medicine

## 2023-07-15 VITALS — BP 115/70 | HR 81 | Temp 98.3°F | Resp 16 | Ht <= 58 in | Wt 151.0 lb

## 2023-07-15 DIAGNOSIS — K449 Diaphragmatic hernia without obstruction or gangrene: Secondary | ICD-10-CM

## 2023-07-15 DIAGNOSIS — K219 Gastro-esophageal reflux disease without esophagitis: Secondary | ICD-10-CM

## 2023-07-15 DIAGNOSIS — R053 Chronic cough: Secondary | ICD-10-CM

## 2023-07-15 MED ORDER — OMEPRAZOLE 40 MG PO CPDR: 40.0000 mg | DELAYED_RELEASE_CAPSULE | Freq: Every day | ORAL | 3 refills | Status: DC

## 2023-07-15 NOTE — Patient Instructions (Signed)
Gastroesophageal Reflux Disease, Adult  Gastroesophageal reflux (GER) happens when acid from the stomach flows up into the tube that connects the mouth and the stomach (esophagus). Normally, food travels down the esophagus and stays in the stomach to be digested. With GER, food and stomach acid sometimes move back up into the esophagus. You may have a disease called gastroesophageal reflux disease (GERD) if the reflux: Happens often. Causes frequent or very bad symptoms. Causes problems such as damage to the esophagus. When this happens, the esophagus becomes sore and swollen. Over time, GERD can make small holes (ulcers) in the lining of the esophagus. What are the causes? This condition is caused by a problem with the muscle between the esophagus and the stomach. When this muscle is weak or not normal, it does not close properly to keep food and acid from coming back up from the stomach. The muscle can be weak because of: Tobacco use. Pregnancy. Having a certain type of hernia (hiatal hernia). Alcohol use. Certain foods and drinks, such as coffee, chocolate, onions, and peppermint. What increases the risk? Being overweight. Having a disease that affects your connective tissue. Taking NSAIDs, such a ibuprofen. What are the signs or symptoms? Heartburn. Difficult or painful swallowing. The feeling of having a lump in the throat. A bitter taste in the mouth. Bad breath. Having a lot of saliva. Having an upset or bloated stomach. Burping. Chest pain. Different conditions can cause chest pain. Make sure you see your doctor if you have chest pain. Shortness of breath or wheezing. A long-term cough or a cough at night. Wearing away of the surface of teeth (tooth enamel). Weight loss. How is this treated? Making changes to your diet. Taking medicine. Having surgery. Treatment will depend on how bad your symptoms are. Follow these instructions at home: Eating and drinking  Follow a  diet as told by your doctor. You may need to avoid foods and drinks such as: Coffee and tea, with or without caffeine. Drinks that contain alcohol. Energy drinks and sports drinks. Bubbly (carbonated) drinks or sodas. Chocolate and cocoa. Peppermint and mint flavorings. Garlic and onions. Horseradish. Spicy and acidic foods. These include peppers, chili powder, curry powder, vinegar, hot sauces, and BBQ sauce. Citrus fruit juices and citrus fruits, such as oranges, lemons, and limes. Tomato-based foods. These include red sauce, chili, salsa, and pizza with red sauce. Fried and fatty foods. These include donuts, french fries, potato chips, and high-fat dressings. High-fat meats. These include hot dogs, rib eye steak, sausage, ham, and bacon. High-fat dairy items, such as whole milk, butter, and cream cheese. Eat small meals often. Avoid eating large meals. Avoid drinking large amounts of liquid with your meals. Avoid eating meals during the 2-3 hours before bedtime. Avoid lying down right after you eat. Do not exercise right after you eat. Lifestyle  Do not smoke or use any products that contain nicotine or tobacco. If you need help quitting, ask your doctor. Try to lower your stress. If you need help doing this, ask your doctor. If you are overweight, lose an amount of weight that is healthy for you. Ask your doctor about a safe weight loss goal. General instructions Pay attention to any changes in your symptoms. Take over-the-counter and prescription medicines only as told by your doctor. Do not take aspirin, ibuprofen, or other NSAIDs unless your doctor says it is okay. Wear loose clothes. Do not wear anything tight around your waist. Raise (elevate) the head of your bed about   6 inches (15 cm). You may need to use a wedge to do this. Avoid bending over if this makes your symptoms worse. Keep all follow-up visits. Contact a doctor if: You have new symptoms. You lose weight and you  do not know why. You have trouble swallowing or it hurts to swallow. You have wheezing or a cough that keeps happening. You have a hoarse voice. Your symptoms do not get better with treatment. Get help right away if: You have sudden pain in your arms, neck, jaw, teeth, or back. You suddenly feel sweaty, dizzy, or light-headed. You have chest pain or shortness of breath. You vomit and the vomit is green, yellow, or black, or it looks like blood or coffee grounds. You faint. Your poop (stool) is red, bloody, or black. You cannot swallow, drink, or eat. These symptoms may represent a serious problem that is an emergency. Do not wait to see if the symptoms will go away. Get medical help right away. Call your local emergency services (911 in the [place] Do not drive yourself to the hospital. Summary If a person has gastroesophageal reflux disease (GERD), food and stomach acid move back up into the esophagus and cause symptoms or problems such as damage to the esophagus. Treatment will depend on how bad your symptoms are. Follow a diet as told by your doctor. Take all medicines only as told by your doctor. This information is not intended to replace advice given to you by your health care provider. Make sure you discuss any questions you have with your health care provider. Document Revised: 06/20/2020 Document Reviewed: 06/20/2020 [employer] Patient Education  2024 [employer]  

## 2023-07-15 NOTE — Progress Notes (Signed)
[facility] [address] [place], [place] [address]  Pulmonary Sleep Medicine   Office Visit Note  Patient Name: Cynthia Keith DOB: 07-31-1946 MRN [id number]  Date of Service: 07/15/2023  Complaints/HPI: She had an UGI done and this confirms presence of a hiatal hernia and esophageal motility issues. This is likely contributing to her cough. She had PFT done which was basically normal except for decreased lung volumes(Obesity). She also needs to work on losing weight. She is on omeprazole but only 20mg   Office Spirometry Results:     ROS  General: (-) fever, (-) chills, (-) night sweats, (-) weakness Skin: (-) rashes, (-) itching,. Eyes: (-) visual changes, (-) redness, (-) itching. Nose and Sinuses: (-) nasal stuffiness or itchiness, (-) postnasal drip, (-) nosebleeds, (-) sinus trouble. Mouth and Throat: (-) sore throat, (-) hoarseness. Neck: (-) swollen glands, (-) enlarged thyroid, (-) neck pain. Respiratory: + cough, (-) bloody sputum, - shortness of breath, - wheezing. Cardiovascular: - ankle swelling, (-) chest pain. Lymphatic: (-) lymph node enlargement. Neurologic: (-) numbness, (-) tingling. Psychiatric: (-) anxiety, (-) depression   Current Medication: Outpatient Encounter Medications as of 07/15/2023  Medication Sig   amLODipine (NORVASC) 10 MG tablet TAKE 1 TABLET BY MOUTH EVERY DAY   aspirin EC 81 MG tablet Take 81 mg by mouth daily.   benzonatate (TESSALON) 200 MG capsule Take 1 capsule (200 mg total) by mouth 2 (two) times daily as needed for cough.   chlorpheniramine-HYDROcodone (TUSSIONEX) 10-8 MG/5ML Take 5 mLs by mouth at bedtime as needed for cough.   cyclobenzaprine (FLEXERIL) 5 MG tablet Take 1 tablet (5 mg total) by mouth at bedtime.   estradiol (ESTRACE) 0.1 MG/GM vaginal cream Apply one pea-sized amount around the opening of the urethra daily for 2 weeks, then 3 times weekly moving forward.   gabapentin (NEURONTIN) 100 MG capsule Take  1 capsule (100 mg total) by mouth 3 (three) times daily as needed.   hydrALAZINE (APRESOLINE) 10 MG tablet TAKE 1 TABLET BY MOUTH THREE TIMES A DAY   hydrochlorothiazide (HYDRODIURIL) 12.5 MG tablet TAKE 1 TABLET BY MOUTH EVERY DAY   Multiple Vitamins-Minerals (HAIR SKIN & NAILS PO) Take by mouth.   omeprazole (PRILOSEC) 20 MG capsule TAKE 1 CAPSULE BY MOUTH EVERY DAY   ondansetron (ZOFRAN-ODT) 4 MG disintegrating tablet Take 1 tablet (4 mg total) by mouth every 8 (eight) hours as needed for nausea or vomiting.   OVER THE COUNTER MEDICATION Doterra-EO Mega/AlphaCRS/MIcro Plex VM/Deep Blue Veg capsules   rosuvastatin (CRESTOR) 5 MG tablet TAKE 1 TABLET (5 MG TOTAL) BY MOUTH DAILY.   potassium chloride (KLOR-CON) 10 MEQ tablet Take 1 tablet (10 mEq total) by mouth daily for 7 days.   No facility-administered encounter medications on file as of 07/15/2023.    Surgical History: Past Surgical History:  Procedure Laterality Date   ABDOMINAL HYSTERECTOMY     COLONOSCOPY WITH PROPOFOL N/A 12/12/2015   Procedure: COLONOSCOPY WITH PROPOFOL;  Surgeon: Scot Jun, MD;  Location: [facility] ENDOSCOPY;  Service: Endoscopy;  Laterality: N/A;   HAND SURGERY  2005    Medical History: Past Medical History:  Diagnosis Date   Actinic keratosis 03/15/2021   L post thigh    Basal cell carcinoma 06/21/2021   L alar crease, EDC done 07/31/21   GERD (gastroesophageal reflux disease)    Hyperlipidemia    Hypertension    Malignant essential hypertension 02/26/2017   Plantar fasciitis    Sleep apnea  Urinary tract infection     Family History: Family History  Problem Relation Age of Onset   Breast cancer Cousin    Stroke Mother    Heart disease Paternal Grandmother    Cancer Paternal Grandfather    Stroke Sister    Stroke Brother     Social History: Social History   Socioeconomic History   Marital status: Married    Spouse name: Not on file   Number of children: Not on file   Years of  education: Not on file   Highest education level: Not on file  Occupational History   Not on file  Tobacco Use   Smoking status: Never   Smokeless tobacco: Never  Vaping Use   Vaping status: Never Used  Substance and Sexual Activity   Alcohol use: Yes    Comment: ocassionally   Drug use: No   Sexual activity: Yes    Birth control/protection: Post-menopausal, Surgical  Other Topics Concern   Not on file  Social History Narrative   Not on file   Social Determinants of Health   Financial Resource Strain: Not on file  Food Insecurity: No Food Insecurity (10/20/2022)   Hunger Vital Sign    Worried About Running Out of Food in the Last Year: Never true    Ran Out of Food in the Last Year: Never true  Transportation Needs: No Transportation Needs (10/20/2022)   PRAPARE - Administrator, Civil Service (Medical): No    Lack of Transportation (Non-Medical): No  Physical Activity: Not on file  Stress: Not on file  Social Connections: Not on file  Intimate Partner Violence: Not At Risk (10/20/2022)   Humiliation, Afraid, Rape, and Kick questionnaire    Fear of Current or Ex-Partner: No    Emotionally Abused: No    Physically Abused: No    Sexually Abused: No    Vital Signs: Blood pressure 115/70, pulse 81, temperature 98.3 F (36.8 C), resp. rate 16, height 4\' 8"  (1.422 m), weight 151 lb (68.5 kg), SpO2 98%.  Examination: General Appearance: The patient is well-developed, well-nourished, and in no distress. Skin: Gross inspection of skin unremarkable. Head: normocephalic, no gross deformities. Eyes: no gross deformities noted. ENT: ears appear grossly normal no exudates. Neck: Supple. No thyromegaly. No LAD. Respiratory: no rhonchi noted. Cardiovascular: Normal S1 and S2 without murmur or rub. Extremities: No cyanosis. pulses are equal. Neurologic: Alert and oriented. No involuntary movements.  LABS: Recent Results (from the past 2160 hour(s))  Comprehensive  metabolic panel     Status: Abnormal   Collection Time: 06/13/23  8:08 AM  Result Value Ref Range   Glucose 106 (H) 70 - 99 mg/dL   BUN 9 8 - 27 mg/dL   Creatinine, Ser 2.95 0.57 - 1.00 mg/dL   eGFR 92 >18 AC/ZYS/0.63   BUN/Creatinine Ratio 14 12 - 28   Sodium 141 134 - 144 mmol/L   Potassium 3.8 3.5 - 5.2 mmol/L   Chloride 100 96 - 106 mmol/L   CO2 28 20 - 29 mmol/L   Calcium 10.4 (H) 8.7 - 10.3 mg/dL   Total Protein 7.2 6.0 - 8.5 g/dL   Albumin 4.4 3.8 - 4.8 g/dL   Globulin, Total 2.8 1.5 - 4.5 g/dL   Bilirubin Total 0.4 0.0 - 1.2 mg/dL   Alkaline Phosphatase 94 44 - 121 IU/L   AST 32 0 - 40 IU/L   ALT 29 0 - 32 IU/L  Hgb A1C w/o eAG  Status: Abnormal   Collection Time: 06/13/23  8:08 AM  Result Value Ref Range   Hgb A1c MFr Bld 5.9 (H) 4.8 - 5.6 %    Comment:          Prediabetes: 5.7 - 6.4          Diabetes: >6.4          Glycemic control for adults with diabetes: <7.0   Pulmonary Function Test     Status: None   Collection Time: 06/24/23  9:06 AM  Result Value Ref Range   FEV1     FVC     FEV1/FVC     TLC     DLCO      Radiology: DG UGI W DOUBLE CM (HD BA)  Result Date: 04/22/2023 CLINICAL DATA:  Patient has history of cough and gastroesophageal reflux. Request is for double upper GI for further evaluation EXAM: UPPER GI SERIES WITH HIGH DENSITY WITHOUT KUB TECHNIQUE: Combined double and single contrast examination was performed using effervescent crystals, high-density barium and thin liquid barium. This exam was performed by Anders Grant NP and was supervised and interpreted by Roanna Banning, MD FLUOROSCOPY: Radiation Exposure Index (as provided by the fluoroscopic device): 23.20 mGy Kerma COMPARISON:  None Available. FINDINGS: Esophagus: Normal appearance. Esophageal motility: Tertiary contractions distal esophagus. Gastroesophageal reflux: None visualized. Ingested 13mm barium tablet: Passed normally Stomach: Normal appearance.  Moderate hiatal hernia. Gastric  emptying: Normal. Duodenum: Normal appearance. Other:  None. IMPRESSION: 1.  Moderate-sized hiatal hernia 2.  Tertiary contractions, as can be seen with presbyesophagus. Electronically Signed   By: Roanna Banning M.D.   On: 04/22/2023 10:59    No results found.  No results found.  Assessment and Plan: Patient Active Problem List   Diagnosis Date Noted   Chest pain 10/19/2022   Erythrocytosis 07/24/2022   Fatty liver disease, nonalcoholic 12/26/2020   Encounter for general adult medical examination with abnormal findings 09/04/2020   Hemochromatosis 09/04/2020   Infection 07/05/2020   Chills 07/05/2020   Body aches 07/05/2020   Other fatigue 07/05/2020   Restless legs syndrome 05/13/2019   Screening for osteoporosis 11/19/2018   Recurrent right upper quadrant abdominal pain 11/19/2018   Need for vaccination against Streptococcus pneumoniae using pneumococcal conjugate vaccine 13 05/26/2018   Essential hypertension 01/27/2018   Urinary tract infection with hematuria 01/27/2018   GERD (gastroesophageal reflux disease) 01/13/2018   Leukocytosis 02/27/2017   Hyperglycemia 02/27/2017   Rhabdomyolysis 02/26/2017   Dysuria 06/13/2016   Frequency of urination 06/13/2016   Posterior vaginal wall prolapse 02/21/2016   Vaginal vault prolapse 09/20/2014   Vaginal atrophy 09/20/2014   Acquired cyst of kidney 08/31/2013   Hereditary and idiopathic peripheral neuropathy 08/18/2012   Renal mass, right 08/18/2012    1. Gastroesophageal reflux disease without esophagitis Significant issues noted on UGI will increase omeprazole - omeprazole (PRILOSEC) 40 MG capsule; Take 1 capsule (40 mg total) by mouth daily.  Dispense: 30 capsule; Refill: 3  2. Obesity, morbid (HCC) Obesity Counseling: Had a lengthy discussion regarding patients BMI and weight issues. Patient was instructed on portion control as well as increased activity. Also discussed caloric restrictions with trying to maintain intake  less than 2000 Kcal. Discussions were made in accordance with the 5As of weight management. Simple actions such as not eating late and if able to, taking a walk is suggested.   3. Persistent dry cough Likely reflux related PFT is fine and also the CXR looked clear  4. Hiatal  hernia Reviewed basic lifestyle modifications with ther   General Counseling: I have discussed the findings of the evaluation and examination with Liborio Nixon.  I have also discussed any further diagnostic evaluation thatmay be needed or ordered today. Rilynn verbalizes understanding of the findings of todays visit. We also reviewed her medications today and discussed drug interactions and side effects including but not limited excessive drowsiness and altered mental states. We also discussed that there is always a risk not just to her but also people around her. she has been encouraged to call the office with any questions or concerns that should arise related to todays visit.  No orders of the defined types were placed in this encounter.    Time spent: 78  I have personally obtained a history, examined the patient, evaluated laboratory and imaging results, formulated the assessment and plan and placed orders.    Yevonne Pax, MD [facility] Pulmonary and Critical Care Sleep medicine

## 2023-07-17 ENCOUNTER — Other Ambulatory Visit: Payer: Self-pay | Admitting: Internal Medicine

## 2023-07-17 DIAGNOSIS — K219 Gastro-esophageal reflux disease without esophagitis: Secondary | ICD-10-CM

## 2023-07-18 ENCOUNTER — Other Ambulatory Visit: Payer: Self-pay | Admitting: Internal Medicine

## 2023-07-18 DIAGNOSIS — K219 Gastro-esophageal reflux disease without esophagitis: Secondary | ICD-10-CM

## 2023-07-23 ENCOUNTER — Telehealth: Payer: Self-pay

## 2023-07-23 NOTE — Telephone Encounter (Signed)
Pt.notified

## 2023-07-25 ENCOUNTER — Telehealth: Payer: Self-pay

## 2023-07-25 DIAGNOSIS — J069 Acute upper respiratory infection, unspecified: Secondary | ICD-10-CM | POA: Diagnosis not present

## 2023-07-25 DIAGNOSIS — Z20822 Contact with and (suspected) exposure to covid-19: Secondary | ICD-10-CM | POA: Diagnosis not present

## 2023-07-25 NOTE — Telephone Encounter (Signed)
Patient called to report that she is having some lightheadedness and headache. She states that her husband had to pick her up from work. I asked the patient what time her last meal/snack was and she told me she had not eaten since 7:30am this morning. Advised patient to eat and drink something and then to call back in 30 minutes to see if there has been any improvement. Patient called back 30 minutes later to report that she is feeling better, but still has a slight headache. I notified the patient to please go to urgent care or the ED if symptoms do not improve, or worsen, within the next hour. Patient states she does not have blood pressure issues and is not diabetic. Patient was compliant with advice that was given.

## 2023-07-27 ENCOUNTER — Other Ambulatory Visit: Payer: Self-pay | Admitting: Physician Assistant

## 2023-07-27 DIAGNOSIS — I10 Essential (primary) hypertension: Secondary | ICD-10-CM

## 2023-08-03 DIAGNOSIS — I10 Essential (primary) hypertension: Secondary | ICD-10-CM | POA: Diagnosis not present

## 2023-08-03 DIAGNOSIS — Z8249 Family history of ischemic heart disease and other diseases of the circulatory system: Secondary | ICD-10-CM | POA: Diagnosis not present

## 2023-08-03 DIAGNOSIS — G2581 Restless legs syndrome: Secondary | ICD-10-CM | POA: Diagnosis not present

## 2023-08-03 DIAGNOSIS — Z809 Family history of malignant neoplasm, unspecified: Secondary | ICD-10-CM | POA: Diagnosis not present

## 2023-08-03 DIAGNOSIS — G629 Polyneuropathy, unspecified: Secondary | ICD-10-CM | POA: Diagnosis not present

## 2023-08-03 DIAGNOSIS — K219 Gastro-esophageal reflux disease without esophagitis: Secondary | ICD-10-CM | POA: Diagnosis not present

## 2023-08-03 DIAGNOSIS — G4733 Obstructive sleep apnea (adult) (pediatric): Secondary | ICD-10-CM | POA: Diagnosis not present

## 2023-08-03 DIAGNOSIS — M5430 Sciatica, unspecified side: Secondary | ICD-10-CM | POA: Diagnosis not present

## 2023-08-03 DIAGNOSIS — M1990 Unspecified osteoarthritis, unspecified site: Secondary | ICD-10-CM | POA: Diagnosis not present

## 2023-08-03 DIAGNOSIS — E669 Obesity, unspecified: Secondary | ICD-10-CM | POA: Diagnosis not present

## 2023-08-03 DIAGNOSIS — R32 Unspecified urinary incontinence: Secondary | ICD-10-CM | POA: Diagnosis not present

## 2023-08-03 DIAGNOSIS — E785 Hyperlipidemia, unspecified: Secondary | ICD-10-CM | POA: Diagnosis not present

## 2023-08-05 ENCOUNTER — Telehealth: Payer: Self-pay

## 2023-08-05 NOTE — Telephone Encounter (Signed)
Patient reported that she is coughing still. Advised patient to try the 40mg  of Omeprazole prescribed by [facility] for GERD. She will also monitor her diet and limit spicy foods.

## 2023-08-12 ENCOUNTER — Ambulatory Visit
Admission: RE | Admit: 2023-08-12 | Discharge: 2023-08-12 | Disposition: A | Discharge status: Home / Self Care | Payer: Medicare HMO | Source: Ambulatory Visit | Attending: Urology | Admitting: Urology

## 2023-08-12 DIAGNOSIS — N2889 Other specified disorders of kidney and ureter: Secondary | ICD-10-CM | POA: Diagnosis not present

## 2023-08-12 DIAGNOSIS — R1903 Right lower quadrant abdominal swelling, mass and lump: Secondary | ICD-10-CM | POA: Diagnosis not present

## 2023-08-12 DIAGNOSIS — M47816 Spondylosis without myelopathy or radiculopathy, lumbar region: Secondary | ICD-10-CM | POA: Diagnosis not present

## 2023-08-12 DIAGNOSIS — K7689 Other specified diseases of liver: Secondary | ICD-10-CM | POA: Diagnosis not present

## 2023-08-12 DIAGNOSIS — N289 Disorder of kidney and ureter, unspecified: Secondary | ICD-10-CM | POA: Diagnosis not present

## 2023-08-12 MED ORDER — GADOBUTROL 1 MMOL/ML IV SOLN
6.0000 mL | Freq: Once | INTRAVENOUS | Status: AC | PRN
  Administered 2023-08-12: 6 mL via INTRAVENOUS

## 2023-08-23 ENCOUNTER — Other Ambulatory Visit: Payer: Self-pay | Admitting: Physician Assistant

## 2023-08-23 DIAGNOSIS — E782 Mixed hyperlipidemia: Secondary | ICD-10-CM

## 2023-09-04 ENCOUNTER — Telehealth: Payer: Self-pay | Admitting: Urology

## 2023-09-04 NOTE — Telephone Encounter (Signed)
MRI showed a stable right renal mass- no worrisome renal cysts are seen

## 2023-09-04 NOTE — Telephone Encounter (Signed)
Pt returned call and has some questions about MRI results.

## 2023-09-09 ENCOUNTER — Encounter: Payer: Self-pay | Admitting: Internal Medicine

## 2023-09-09 ENCOUNTER — Ambulatory Visit: Payer: Medicare HMO | Admitting: Internal Medicine

## 2023-09-09 VITALS — BP 130/90 | HR 69 | Temp 98.2°F | Resp 16 | Ht <= 58 in | Wt 150.2 lb

## 2023-09-09 DIAGNOSIS — R053 Chronic cough: Secondary | ICD-10-CM

## 2023-09-09 DIAGNOSIS — K219 Gastro-esophageal reflux disease without esophagitis: Secondary | ICD-10-CM

## 2023-09-09 NOTE — Progress Notes (Signed)
[facility] [address] [place], [place] [address]  Pulmonary Sleep Medicine   Office Visit Note  Patient Name: Cynthia Keith DOB: [birth year] MRN [id number]  Date of Service: 09/09/2023  Complaints/HPI: follow up for cough. She has a history of hiatal hernia and probable GERD. I think this is the major issue with her as far as the cough is concerned. Seems to be worse at night. Spoke to her also about putting the last meal 4 hours before bedtime. She needs to work on not laying down immediately after eating. However despite all this she has not had a recent CT scan so will be a good idea to get this done now. In addition she is on losaartan and we may discuss the possibility of changing this for her BP control. Of note she had PFT done which was basically normal  Office Spirometry Results:     ROS  General: (-) fever, (-) chills, (-) night sweats, (-) weakness Skin: (-) rashes, (-) itching,. Eyes: (-) visual changes, (-) redness, (-) itching. Nose and Sinuses: (-) nasal stuffiness or itchiness, (-) postnasal drip, (-) nosebleeds, (-) sinus trouble. Mouth and Throat: (-) sore throat, (-) hoarseness. Neck: (-) swollen glands, (-) enlarged thyroid, (-) neck pain. Respiratory: + cough, (-) bloody sputum, - shortness of breath, - wheezing. Cardiovascular: - ankle swelling, (-) chest pain. Lymphatic: (-) lymph node enlargement. Neurologic: (-) numbness, (-) tingling. Psychiatric: (-) anxiety, (-) depression   Current Medication: Outpatient Encounter Medications as of 09/09/2023  Medication Sig   amLODipine (NORVASC) 10 MG tablet TAKE 1 TABLET BY MOUTH EVERY DAY   aspirin EC 81 MG tablet Take 81 mg by mouth daily.   benzonatate (TESSALON) 200 MG capsule Take 1 capsule (200 mg total) by mouth 2 (two) times daily as needed for cough.   chlorpheniramine-HYDROcodone (TUSSIONEX) 10-8 MG/5ML Take 5 mLs by mouth at bedtime as needed for cough.   cyclobenzaprine (FLEXERIL) 5  MG tablet Take 1 tablet (5 mg total) by mouth at bedtime.   estradiol (ESTRACE) 0.1 MG/GM vaginal cream Apply one pea-sized amount around the opening of the urethra daily for 2 weeks, then 3 times weekly moving forward.   gabapentin (NEURONTIN) 100 MG capsule Take 1 capsule (100 mg total) by mouth 3 (three) times daily as needed.   hydrALAZINE (APRESOLINE) 10 MG tablet TAKE 1 TABLET BY MOUTH THREE TIMES A DAY   hydrochlorothiazide (HYDRODIURIL) 12.5 MG tablet TAKE 1 TABLET BY MOUTH EVERY DAY   Multiple Vitamins-Minerals (HAIR SKIN & NAILS PO) Take by mouth.   omeprazole (PRILOSEC) 40 MG capsule TAKE 1 CAPSULE (40 MG TOTAL) BY MOUTH DAILY.   ondansetron (ZOFRAN-ODT) 4 MG disintegrating tablet Take 1 tablet (4 mg total) by mouth every 8 (eight) hours as needed for nausea or vomiting.   OVER THE COUNTER MEDICATION Doterra-EO Mega/AlphaCRS/MIcro Plex VM/Deep Blue Veg capsules   rosuvastatin (CRESTOR) 5 MG tablet TAKE 1 TABLET (5 MG TOTAL) BY MOUTH DAILY.   potassium chloride (KLOR-CON) 10 MEQ tablet Take 1 tablet (10 mEq total) by mouth daily for 7 days.   No facility-administered encounter medications on file as of 09/09/2023.    Surgical History: Past Surgical History:  Procedure Laterality Date   ABDOMINAL HYSTERECTOMY     COLONOSCOPY WITH PROPOFOL N/A 12/12/2015   Procedure: COLONOSCOPY WITH PROPOFOL;  Surgeon: Scot Jun, MD;  Location: [facility] ENDOSCOPY;  Service: Endoscopy;  Laterality: N/A;   HAND SURGERY  2005    Medical History: Past Medical  History:  Diagnosis Date   Actinic keratosis 03/15/2021   L post thigh    Basal cell carcinoma 06/21/2021   L alar crease, EDC done 07/31/21   GERD (gastroesophageal reflux disease)    Hyperlipidemia    Hypertension    Malignant essential hypertension 02/26/2017   Plantar fasciitis    Sleep apnea    Urinary tract infection     Family History: Family History  Problem Relation Age of Onset   Breast cancer Cousin    Stroke Mother     Heart disease Paternal Grandmother    Cancer Paternal Grandfather    Stroke Sister    Stroke Brother     Social History: Social History   Socioeconomic History   Marital status: Married    Spouse name: Not on file   Number of children: Not on file   Years of education: Not on file   Highest education level: Not on file  Occupational History   Not on file  Tobacco Use   Smoking status: Never   Smokeless tobacco: Never  Vaping Use   Vaping status: Never Used  Substance and Sexual Activity   Alcohol use: Yes    Comment: ocassionally   Drug use: No   Sexual activity: Yes    Birth control/protection: Post-menopausal, Surgical  Other Topics Concern   Not on file  Social History Narrative   Not on file   Social Determinants of Health   Financial Resource Strain: Not on file  Food Insecurity: No Food Insecurity (10/20/2022)   Hunger Vital Sign    Worried About Running Out of Food in the Last Year: Never true    Ran Out of Food in the Last Year: Never true  Transportation Needs: No Transportation Needs (10/20/2022)   PRAPARE - Administrator, Civil Service (Medical): No    Lack of Transportation (Non-Medical): No  Physical Activity: Not on file  Stress: Not on file  Social Connections: Not on file  Intimate Partner Violence: Not At Risk (10/20/2022)   Humiliation, Afraid, Rape, and Kick questionnaire    Fear of Current or Ex-Partner: No    Emotionally Abused: No    Physically Abused: No    Sexually Abused: No    Vital Signs: Blood pressure (!) 130/90, pulse 69, temperature 98.2 F (36.8 C), resp. rate 16, height 4\' 8"  (1.422 m), weight 150 lb 3.2 oz (68.1 kg), SpO2 98%.  Examination: General Appearance: The patient is well-developed, well-nourished, and in no distress. Skin: Gross inspection of skin unremarkable. Head: normocephalic, no gross deformities. Eyes: no gross deformities noted. ENT: ears appear grossly normal no exudates. Neck: Supple. No  thyromegaly. No LAD. Respiratory: no rhonchi noted. Cardiovascular: Normal S1 and S2 without murmur or rub. Extremities: No cyanosis. pulses are equal. Neurologic: Alert and oriented. No involuntary movements.  LABS: Recent Results (from the past 2160 hour(s))  Comprehensive metabolic panel     Status: Abnormal   Collection Time: 06/13/23  8:08 AM  Result Value Ref Range   Glucose 106 (H) 70 - 99 mg/dL   BUN 9 8 - 27 mg/dL   Creatinine, Ser 8.84 0.57 - 1.00 mg/dL   eGFR 92 >16 SA/YTK/1.60   BUN/Creatinine Ratio 14 12 - 28   Sodium 141 134 - 144 mmol/L   Potassium 3.8 3.5 - 5.2 mmol/L   Chloride 100 96 - 106 mmol/L   CO2 28 20 - 29 mmol/L   Calcium 10.4 (H) 8.7 - 10.3 mg/dL  Total Protein 7.2 6.0 - 8.5 g/dL   Albumin 4.4 3.8 - 4.8 g/dL   Globulin, Total 2.8 1.5 - 4.5 g/dL   Bilirubin Total 0.4 0.0 - 1.2 mg/dL   Alkaline Phosphatase 94 44 - 121 IU/L   AST 32 0 - 40 IU/L   ALT 29 0 - 32 IU/L  Hgb A1C w/o eAG     Status: Abnormal   Collection Time: 06/13/23  8:08 AM  Result Value Ref Range   Hgb A1c MFr Bld 5.9 (H) 4.8 - 5.6 %    Comment:          Prediabetes: 5.7 - 6.4          Diabetes: >6.4          Glycemic control for adults with diabetes: <7.0   Pulmonary Function Test     Status: None   Collection Time: 06/24/23  9:06 AM  Result Value Ref Range   FEV1     FVC     FEV1/FVC     TLC     DLCO      Radiology: MR Abdomen W Wo Contrast  Result Date: 08/16/2023 CLINICAL DATA:  Indeterminate renal lesion on ultrasound, for further characterization EXAM: MRI ABDOMEN WITHOUT AND WITH CONTRAST TECHNIQUE: Multiplanar multisequence MR imaging of the abdomen was performed both before and after the administration of intravenous contrast. CONTRAST:  6mL GADAVIST GADOBUTROL 1 MMOL/ML IV SOLN COMPARISON:  Renal ultrasound 01/24/2022 and prior MRI from 06/08/2010 FINDINGS: Lower chest: Unremarkable Hepatobiliary: Chronic extensive multifocal tiny cystic lesions of the liver are most  confluent at the hepatic dome. The largest cyst is in the left hepatic lobe measures 1.7 cm in long axis. These lesions are stable back through 2011 and top differential diagnostic considerations include biliary hamartomas (favored) over chronic tiny cysts. No central dot sign to suggest Caroli disease. The gallbladder appears unremarkable. Common bile duct 8 mm in diameter, borderline enlarged, although similar from 2011. Pancreas:  No significant pancreatic findings. Spleen:  Unremarkable Adrenals/Urinary Tract:  Both adrenal glands appear normal. Exophytic 1.6 by 1.6 by 1.5 cm enhancing mass of the right mid to lower kidney laterally on image 20 series 20. This has T2 signal characteristics roughly similar to renal parenchyma, appearance generally compatible with a renal cell carcinoma. This lesion measured about 1.3 cm in diameter on 06/08/2010 and accordingly has been extremely indolent since that time. No internal fat content observed. Left kidney appears unremarkable. Stomach/Bowel: Unremarkable Vascular/Lymphatic: Is a suggestion of narrowing of the proximal celiac trunk, possibly from median arcuate ligament or atheromatous plaque, but no occlusion is observed. There is no tumor thrombus in the right renal vein. No pathologic adenopathy. Other:  No supplemental non-categorized findings. Musculoskeletal: Levoconvex lumbar scoliosis with lower lumbar spondylosis. IMPRESSION: 1. Exophytic 1.6 cm enhancing mass of the right mid to lower kidney laterally, compatible with a renal cell carcinoma. This lesion measured about 1.3 cm in diameter on 06/08/2010 and accordingly has been extremely indolent since that time. No tumor thrombus in the right renal vein. No pathologic adenopathy. 2. Chronic extensive multifocal tiny cystic lesions of the liver are most confluent at the hepatic dome. These lesions are stable back through 2011 and top differential diagnostic considerations include biliary hamartomas (favored)  over chronic tiny cysts. 3. Levoconvex lumbar scoliosis with lower lumbar spondylosis. 4. Suspected narrowing of the proximal celiac trunk, possibly from median arcuate ligament or atheromatous plaque, but no occlusion is observed. Electronically Signed   By: Zollie Beckers  Ova Freshwater M.D.   On: 08/16/2023 13:59    No results found.  MR Abdomen W Wo Contrast  Result Date: 08/16/2023 CLINICAL DATA:  Indeterminate renal lesion on ultrasound, for further characterization EXAM: MRI ABDOMEN WITHOUT AND WITH CONTRAST TECHNIQUE: Multiplanar multisequence MR imaging of the abdomen was performed both before and after the administration of intravenous contrast. CONTRAST:  6mL GADAVIST GADOBUTROL 1 MMOL/ML IV SOLN COMPARISON:  Renal ultrasound 01/24/2022 and prior MRI from 06/08/2010 FINDINGS: Lower chest: Unremarkable Hepatobiliary: Chronic extensive multifocal tiny cystic lesions of the liver are most confluent at the hepatic dome. The largest cyst is in the left hepatic lobe measures 1.7 cm in long axis. These lesions are stable back through 2011 and top differential diagnostic considerations include biliary hamartomas (favored) over chronic tiny cysts. No central dot sign to suggest Caroli disease. The gallbladder appears unremarkable. Common bile duct 8 mm in diameter, borderline enlarged, although similar from 2011. Pancreas:  No significant pancreatic findings. Spleen:  Unremarkable Adrenals/Urinary Tract:  Both adrenal glands appear normal. Exophytic 1.6 by 1.6 by 1.5 cm enhancing mass of the right mid to lower kidney laterally on image 20 series 20. This has T2 signal characteristics roughly similar to renal parenchyma, appearance generally compatible with a renal cell carcinoma. This lesion measured about 1.3 cm in diameter on 06/08/2010 and accordingly has been extremely indolent since that time. No internal fat content observed. Left kidney appears unremarkable. Stomach/Bowel: Unremarkable Vascular/Lymphatic: Is a  suggestion of narrowing of the proximal celiac trunk, possibly from median arcuate ligament or atheromatous plaque, but no occlusion is observed. There is no tumor thrombus in the right renal vein. No pathologic adenopathy. Other:  No supplemental non-categorized findings. Musculoskeletal: Levoconvex lumbar scoliosis with lower lumbar spondylosis. IMPRESSION: 1. Exophytic 1.6 cm enhancing mass of the right mid to lower kidney laterally, compatible with a renal cell carcinoma. This lesion measured about 1.3 cm in diameter on 06/08/2010 and accordingly has been extremely indolent since that time. No tumor thrombus in the right renal vein. No pathologic adenopathy. 2. Chronic extensive multifocal tiny cystic lesions of the liver are most confluent at the hepatic dome. These lesions are stable back through 2011 and top differential diagnostic considerations include biliary hamartomas (favored) over chronic tiny cysts. 3. Levoconvex lumbar scoliosis with lower lumbar spondylosis. 4. Suspected narrowing of the proximal celiac trunk, possibly from median arcuate ligament or atheromatous plaque, but no occlusion is observed. Electronically Signed   By: Gaylyn Rong M.D.   On: 08/16/2023 13:59    Assessment and Plan: Patient Active Problem List   Diagnosis Date Noted   Chest pain 10/19/2022   Erythrocytosis 07/24/2022   Fatty liver disease, nonalcoholic 12/26/2020   Encounter for general adult medical examination with abnormal findings 09/04/2020   Hemochromatosis 09/04/2020   Infection 07/05/2020   Chills 07/05/2020   Body aches 07/05/2020   Other fatigue 07/05/2020   Restless legs syndrome 05/13/2019   Screening for osteoporosis 11/19/2018   Recurrent right upper quadrant abdominal pain 11/19/2018   Need for vaccination against Streptococcus pneumoniae using pneumococcal conjugate vaccine 13 05/26/2018   Essential hypertension 01/27/2018   Urinary tract infection with hematuria 01/27/2018   GERD  (gastroesophageal reflux disease) 01/13/2018   Leukocytosis 02/27/2017   Hyperglycemia 02/27/2017   Rhabdomyolysis 02/26/2017   Dysuria 06/13/2016   Frequency of urination 06/13/2016   Posterior vaginal wall prolapse 02/21/2016   Vaginal vault prolapse 09/20/2014   Vaginal atrophy 09/20/2014   Acquired cyst of kidney 08/31/2013  Hereditary and idiopathic peripheral neuropathy 08/18/2012   Renal mass, right 08/18/2012    1. Chronic cough Consider changing the losaartan. Need to continue treatment for the high BP perhaps an alternate medication. Also would continue with omeprazole  2. Gastroesophageal reflux disease without esophagitis Will continue with reflux management.  Will continue secretion management pulmonary toilet.  Patient has little bit better control with the cough.  3. Obesity, morbid (HCC) .Obesity Counseling: Had a lengthy discussion regarding patients BMI and weight issues. Patient was instructed on portion control as well as increased activity. Also discussed caloric restrictions with trying to maintain intake less than 2000 Kcal. Discussions were made in accordance with the 5As of weight management. Simple actions such as not eating late and if able to, taking a walk is suggested.    General Counseling: I have discussed the findings of the evaluation and examination with Liborio Nixon.  I have also discussed any further diagnostic evaluation thatmay be needed or ordered today. Dollye verbalizes understanding of the findings of todays visit. We also reviewed her medications today and discussed drug interactions and side effects including but not limited excessive drowsiness and altered mental states. We also discussed that there is always a risk not just to her but also people around her. she has been encouraged to call the office with any questions or concerns that should arise related to todays visit.  No orders of the defined types were placed in this encounter.    Time  spent: 105  I have personally obtained a history, examined the patient, evaluated laboratory and imaging results, formulated the assessment and plan and placed orders.    Yevonne Pax, MD [facility] Pulmonary and Critical Care Sleep medicine

## 2023-09-16 ENCOUNTER — Ambulatory Visit
Admission: RE | Admit: 2023-09-16 | Discharge: 2023-09-16 | Disposition: A | Discharge status: Home / Self Care | Payer: Medicare HMO | Source: Ambulatory Visit | Attending: Internal Medicine | Admitting: Internal Medicine

## 2023-09-16 DIAGNOSIS — R053 Chronic cough: Secondary | ICD-10-CM | POA: Diagnosis not present

## 2023-09-23 ENCOUNTER — Ambulatory Visit: Payer: Medicare HMO | Admitting: Nurse Practitioner

## 2023-09-30 ENCOUNTER — Encounter: Payer: Self-pay | Admitting: Physician Assistant

## 2023-09-30 ENCOUNTER — Ambulatory Visit: Payer: Medicare HMO | Admitting: Physician Assistant

## 2023-09-30 VITALS — BP 142/70 | HR 75 | Temp 97.9°F | Resp 16 | Ht <= 58 in | Wt 149.6 lb

## 2023-09-30 DIAGNOSIS — N2889 Other specified disorders of kidney and ureter: Secondary | ICD-10-CM

## 2023-09-30 DIAGNOSIS — I2510 Atherosclerotic heart disease of native coronary artery without angina pectoris: Secondary | ICD-10-CM | POA: Diagnosis not present

## 2023-09-30 DIAGNOSIS — I10 Essential (primary) hypertension: Secondary | ICD-10-CM | POA: Diagnosis not present

## 2023-09-30 DIAGNOSIS — K219 Gastro-esophageal reflux disease without esophagitis: Secondary | ICD-10-CM

## 2023-09-30 DIAGNOSIS — Z0001 Encounter for general adult medical examination with abnormal findings: Secondary | ICD-10-CM

## 2023-09-30 DIAGNOSIS — I700 Atherosclerosis of aorta: Secondary | ICD-10-CM

## 2023-09-30 NOTE — Progress Notes (Signed)
[facility] [address] [place], [place] [address]  Internal MEDICINE  Office Visit Note  Patient Name: Cynthia Keith  [id number]  [id number]  Date of Service: 10/09/2023  Chief Complaint  Patient presents with   Medicare Wellness   Gastroesophageal Reflux    Still coughing, causing chest to hurt from coughing so much. Review CT scan.   Hypertension   Hyperlipidemia   Quality Metric Gaps    Shingles and TDAP    HPI Cynthia Keith presents for an annual well visit and physical exam.  Well-appearing 77 y.o.female presenting with her husband DEXA scan: Done in 2020 Labs: previously reviewed  Other concerns: memory still a concern despite normal MMSE--seeing neurology for this. Patient does repeat information during visit and family reports significant memory decline which pt is now acknowledging as well. Will go ahead and discuss possible medications with neurology at upcoming follow up -Ct chest ordered by pulmonology due to chronic cough and was reviewed: No explanation for cough-no abnormal pulmonary findings. Still having solid right renal lesion seen on MRI with urology and has further follow up with urology planned. Otherwise coronary artery calcifications and aortic atherosclerosis seen and will go ahead and refer for further cardiology evaluation -Per pulmonology note, cough likely attributed to reflux/hiatal hernia. Will continue omeprazole. ACE/ARB already discontinued as possible cause -Also further discussed calcium elevation addressed after previous labs and reminded pt to decrease supplement containing calcium -BP not checked at home, 140/68. Will start checking at home again     09/30/2023   10:01 AM 11/26/2022    9:26 AM 09/24/2022    9:56 AM  MMSE - Mini Mental State Exam  Orientation to time 5 3 5   Orientation to Place 5 3 5   Registration 3 3 3   Attention/ Calculation 5 5 5   Recall 3 2 3   Language- name 2 objects 2 2 2   Language- repeat 1 1 1   Language-  follow 3 step command 3 3 3   Language- read & follow direction 1 1 1   Write a sentence 1 1 1   Copy design 1 1 1   Total score 30 25 30     Functional Status Survey: Is the patient deaf or have difficulty hearing?: No Does the patient have difficulty seeing, even when wearing glasses/contacts?: No Does the patient have difficulty concentrating, remembering, or making decisions?: Yes (Difficulty with memory) Does the patient have difficulty walking or climbing stairs?: No Does the patient have difficulty dressing or bathing?: No Does the patient have difficulty doing errands alone such as visiting a doctor's office or shopping?: No     10/20/2022    8:00 AM 11/26/2022    9:26 AM 01/28/2023    8:35 AM 06/17/2023    9:48 AM 09/30/2023   10:00 AM  Fall Risk  Falls in the past year?  0 0 1 0  Was there an injury with Fall?    0   Fall Risk Category Calculator    1   (RETIRED) Patient Fall Risk Level Moderate fall risk           09/30/2023   10:00 AM  Depression screen PHQ 2/9  Decreased Interest 0  Down, Depressed, Hopeless 0  PHQ - 2 Score 0        No data to display            Current Medication: Outpatient Encounter Medications as of 09/30/2023  Medication Sig   amLODipine (NORVASC) 10 MG tablet TAKE 1 TABLET  BY MOUTH EVERY DAY   aspirin EC 81 MG tablet Take 81 mg by mouth daily.   benzonatate (TESSALON) 200 MG capsule Take 1 capsule (200 mg total) by mouth 2 (two) times daily as needed for cough.   chlorpheniramine-HYDROcodone (TUSSIONEX) 10-8 MG/5ML Take 5 mLs by mouth at bedtime as needed for cough.   cyclobenzaprine (FLEXERIL) 5 MG tablet Take 1 tablet (5 mg total) by mouth at bedtime.   estradiol (ESTRACE) 0.1 MG/GM vaginal cream Apply one pea-sized amount around the opening of the urethra daily for 2 weeks, then 3 times weekly moving forward.   gabapentin (NEURONTIN) 100 MG capsule Take 1 capsule (100 mg total) by mouth 3 (three) times daily as needed.   hydrALAZINE  (APRESOLINE) 10 MG tablet TAKE 1 TABLET BY MOUTH THREE TIMES A DAY   hydrochlorothiazide (HYDRODIURIL) 12.5 MG tablet TAKE 1 TABLET BY MOUTH EVERY DAY   Multiple Vitamins-Minerals (HAIR SKIN & NAILS PO) Take by mouth.   omeprazole (PRILOSEC) 40 MG capsule TAKE 1 CAPSULE (40 MG TOTAL) BY MOUTH DAILY.   ondansetron (ZOFRAN-ODT) 4 MG disintegrating tablet Take 1 tablet (4 mg total) by mouth every 8 (eight) hours as needed for nausea or vomiting.   OVER THE COUNTER MEDICATION Doterra-EO Mega/AlphaCRS/MIcro Plex VM/Deep Blue Veg capsules   rosuvastatin (CRESTOR) 5 MG tablet TAKE 1 TABLET (5 MG TOTAL) BY MOUTH DAILY.   potassium chloride (KLOR-CON) 10 MEQ tablet Take 1 tablet (10 mEq total) by mouth daily for 7 days.   No facility-administered encounter medications on file as of 09/30/2023.    Surgical History: Past Surgical History:  Procedure Laterality Date   ABDOMINAL HYSTERECTOMY     COLONOSCOPY WITH PROPOFOL N/A 12/12/2015   Procedure: COLONOSCOPY WITH PROPOFOL;  Surgeon: Scot Jun, MD;  Location: [facility] ENDOSCOPY;  Service: Endoscopy;  Laterality: N/A;   HAND SURGERY  2005    Medical History: Past Medical History:  Diagnosis Date   Actinic keratosis 03/15/2021   L post thigh    Basal cell carcinoma 06/21/2021   L alar crease, EDC done 07/31/21   GERD (gastroesophageal reflux disease)    Hyperlipidemia    Hypertension    Malignant essential hypertension 02/26/2017   Plantar fasciitis    Sleep apnea    Urinary tract infection     Family History: Family History  Problem Relation Age of Onset   Breast cancer Cousin    Stroke Mother    Heart disease Paternal Grandmother    Cancer Paternal Grandfather    Stroke Sister    Stroke Brother     Social History   Socioeconomic History   Marital status: Married    Spouse name: Not on file   Number of children: Not on file   Years of education: Not on file   Highest education level: Not on file  Occupational History   Not  on file  Tobacco Use   Smoking status: Never   Smokeless tobacco: Never  Vaping Use   Vaping status: Never Used  Substance and Sexual Activity   Alcohol use: Yes    Comment: ocassionally   Drug use: No   Sexual activity: Yes    Birth control/protection: Post-menopausal, Surgical  Other Topics Concern   Not on file  Social History Narrative   Not on file   Social Determinants of Health   Financial Resource Strain: Not on file  Food Insecurity: No Food Insecurity (10/20/2022)   Hunger Vital Sign    Worried About Running  Out of Food in the Last Year: Never true    Ran Out of Food in the Last Year: Never true  Transportation Needs: No Transportation Needs (10/20/2022)   PRAPARE - Administrator, Civil Service (Medical): No    Lack of Transportation (Non-Medical): No  Physical Activity: Not on file  Stress: Not on file  Social Connections: Not on file  Intimate Partner Violence: Not At Risk (10/20/2022)   Humiliation, Afraid, Rape, and Kick questionnaire    Fear of Current or Ex-Partner: No    Emotionally Abused: No    Physically Abused: No    Sexually Abused: No      Review of Systems  Constitutional:  Negative for chills, fatigue and unexpected weight change.  HENT:  Negative for congestion, postnasal drip, rhinorrhea, sneezing and sore throat.   Eyes:  Negative for redness.  Respiratory:  Positive for cough. Negative for chest tightness, shortness of breath and wheezing.   Cardiovascular:  Negative for chest pain and palpitations.  Gastrointestinal:  Negative for abdominal pain, constipation, diarrhea, nausea and vomiting.  Genitourinary:  Negative for dysuria and frequency.  Musculoskeletal:  Negative for arthralgias, joint swelling and neck pain.  Skin:  Negative for rash.  Neurological: Negative.  Negative for tremors and numbness.  Hematological:  Negative for adenopathy. Does not bruise/bleed easily.  Psychiatric/Behavioral:  Positive for confusion.  Negative for behavioral problems (Depression), sleep disturbance and suicidal ideas. The patient is not nervous/anxious.        Memory loss    Vital Signs: BP (!) 142/70   Pulse 75   Temp 97.9 F (36.6 C)   Resp 16   Ht 4\' 8"  (1.422 m)   Wt 149 lb 9.6 oz (67.9 kg)   SpO2 97%   BMI 33.54 kg/m    Physical Exam Vitals and nursing note reviewed.  Constitutional:      General: She is not in acute distress.    Appearance: Normal appearance. She is well-developed. She is obese. She is not diaphoretic.  HENT:     Head: Normocephalic and atraumatic.     Mouth/Throat:     Pharynx: No oropharyngeal exudate.  Eyes:     Pupils: Pupils are equal, round, and reactive to light.  Neck:     Thyroid: No thyromegaly.     Vascular: No JVD.     Trachea: No tracheal deviation.  Cardiovascular:     Rate and Rhythm: Normal rate and regular rhythm.     Heart sounds: Normal heart sounds. No murmur heard.    No friction rub. No gallop.  Pulmonary:     Effort: Pulmonary effort is normal. No respiratory distress.     Breath sounds: No wheezing or rales.  Chest:     Chest wall: No tenderness.  Abdominal:     General: Bowel sounds are normal.     Palpations: Abdomen is soft.  Musculoskeletal:        General: Normal range of motion.     Cervical back: Normal range of motion and neck supple.  Lymphadenopathy:     Cervical: No cervical adenopathy.  Skin:    General: Skin is warm and dry.  Neurological:     Mental Status: She is alert and oriented to person, place, and time.     Cranial Nerves: No cranial nerve deficit.  Psychiatric:        Behavior: Behavior normal.        Thought Content: Thought content normal.  Judgment: Judgment normal.        Assessment/Plan: 1. Encounter for general adult medical examination with abnormal findings CPE performed, UTD on PHM except for tdap and shingles vaccines and will look into this  2. Coronary artery disease involving native coronary  artery of native heart without angina pectoris Seen on CT chest and will refer for further evaluation, on crestor - Ambulatory referral to Cardiology  3. Aortic atherosclerosis (HCC) Continue crestor - Ambulatory referral to Cardiology  4. Essential hypertension Borderline high  in office and will start monitoring at home  5. Renal mass, right Followed by urology  6. Gastroesophageal reflux disease without esophagitis Continue omeprazole, may need to see GI/GS if hernia and reflux continue to possible cause of cough    General Counseling: Trudi Ida understanding of the findings of todays visit and agrees with plan of treatment. I have discussed any further diagnostic evaluation that may be needed or ordered today. We also reviewed her medications today. she has been encouraged to call the office with any questions or concerns that should arise related to todays visit.    Orders Placed This Encounter  Procedures   Ambulatory referral to Cardiology    No orders of the defined types were placed in this encounter.   Return in about 6 weeks (around 11/11/2023) for HTN.   Total time spent:35 Minutes Time spent includes review of chart, medications, test results, and follow up plan with the patient.   [place] Controlled Substance Database was reviewed by me.  This patient was seen by Lynn Ito, PA-C in collaboration with Dr. Beverely Risen as a part of collaborative care agreement.  Lynn Ito, PA-C Internal medicine

## 2023-10-07 DIAGNOSIS — M5432 Sciatica, left side: Secondary | ICD-10-CM | POA: Diagnosis not present

## 2023-10-07 DIAGNOSIS — M545 Low back pain, unspecified: Secondary | ICD-10-CM | POA: Diagnosis not present

## 2023-10-14 ENCOUNTER — Encounter: Payer: Self-pay | Admitting: Dermatology

## 2023-10-14 ENCOUNTER — Ambulatory Visit (INDEPENDENT_AMBULATORY_CARE_PROVIDER_SITE_OTHER): Payer: Medicare HMO | Admitting: Dermatology

## 2023-10-14 DIAGNOSIS — L304 Erythema intertrigo: Secondary | ICD-10-CM

## 2023-10-14 DIAGNOSIS — D225 Melanocytic nevi of trunk: Secondary | ICD-10-CM

## 2023-10-14 DIAGNOSIS — Z1283 Encounter for screening for malignant neoplasm of skin: Secondary | ICD-10-CM

## 2023-10-14 DIAGNOSIS — L821 Other seborrheic keratosis: Secondary | ICD-10-CM

## 2023-10-14 DIAGNOSIS — D229 Melanocytic nevi, unspecified: Secondary | ICD-10-CM | POA: Diagnosis not present

## 2023-10-14 DIAGNOSIS — I8393 Asymptomatic varicose veins of bilateral lower extremities: Secondary | ICD-10-CM | POA: Diagnosis not present

## 2023-10-14 DIAGNOSIS — L578 Other skin changes due to chronic exposure to nonionizing radiation: Secondary | ICD-10-CM

## 2023-10-14 DIAGNOSIS — I781 Nevus, non-neoplastic: Secondary | ICD-10-CM | POA: Diagnosis not present

## 2023-10-14 DIAGNOSIS — Z85828 Personal history of other malignant neoplasm of skin: Secondary | ICD-10-CM

## 2023-10-14 DIAGNOSIS — W908XXA Exposure to other nonionizing radiation, initial encounter: Secondary | ICD-10-CM | POA: Diagnosis not present

## 2023-10-14 DIAGNOSIS — L820 Inflamed seborrheic keratosis: Secondary | ICD-10-CM | POA: Diagnosis not present

## 2023-10-14 DIAGNOSIS — L918 Other hypertrophic disorders of the skin: Secondary | ICD-10-CM

## 2023-10-14 DIAGNOSIS — L814 Other melanin hyperpigmentation: Secondary | ICD-10-CM | POA: Diagnosis not present

## 2023-10-14 NOTE — Progress Notes (Signed)
Follow-Up Visit   Subjective  Cynthia Keith is a 77 y.o. female who presents for the following: Skin Cancer Screening and Full Body Skin Exam. Hx of BCC.   Check redness at breast folds. Dur: 2 months off and on. Has been using Vaseline Jelly and antifungal foot cream and powder. Denies itching.  The patient presents for Total-Body Skin Exam (TBSE) for skin cancer screening and mole check. The patient has spots, moles and lesions to be evaluated, some may be new or changing and the patient may have concern these could be cancer.    The following portions of the chart were reviewed this encounter and updated as appropriate: medications, allergies, medical history  Review of Systems:  No other skin or systemic complaints except as noted in HPI or Assessment and Plan.  Objective  Well appearing patient in no apparent distress; mood and affect are within normal limits.  A full examination was performed including scalp, head, eyes, ears, nose, lips, neck, chest, axillae, abdomen, back, buttocks, bilateral upper extremities, bilateral lower extremities, hands, feet, fingers, toes, fingernails, and toenails. All findings within normal limits unless otherwise noted below.   Relevant physical exam findings are noted in the Assessment and Plan.  B/L Inframammary Folds Erythematous keratotic or waxy stuck-on papule or plaque.  Exam of nails limited by presence of nail polish.   Assessment & Plan   HISTORY OF BASAL CELL CARCINOMA OF THE SKIN. Left alar crease. EDC 07/31/2021. - No evidence of recurrence today - Recommend regular full body skin exams - Recommend daily broad spectrum sunscreen SPF 30+ to sun-exposed areas, reapply every 2 hours as needed.  - Call if any new or changing lesions are noted between office visits   SKIN CANCER SCREENING PERFORMED TODAY.  ACTINIC DAMAGE - Chronic condition, secondary to cumulative UV/sun exposure - diffuse scaly erythematous macules with  underlying dyspigmentation - Recommend daily broad spectrum sunscreen SPF 30+ to sun-exposed areas, reapply every 2 hours as needed.  - Staying in the shade or wearing long sleeves, sun glasses (UVA+UVB protection) and wide brim hats (4-inch brim around the entire circumference of the hat) are also recommended for sun protection.  - Call for new or changing lesions.  LENTIGINES, SEBORRHEIC KERATOSES, HEMANGIOMAS - Benign normal skin lesions - Benign-appearing - Call for any changes  MELANOCYTIC NEVI - Tan-brown and/or pink-flesh-colored symmetric macules and papules - 5mm flesh papule with darker rim at left medial buttock, stable compared to previous visit - Benign appearing on exam today - Observation - Call clinic for new or changing moles - Recommend daily use of broad spectrum spf 30+ sunscreen to sun-exposed areas.  - Check nails when remove polish.  Nevus Spilus - Brown macule within lighter tan patch at left posterior thigh - Genetic - Benign, observe - Call for any changes   Varicose Veins/Spider Veins - Dilated blue, purple or red veins at the lower extremities - Reassured - Smaller vessels can be treated by sclerotherapy (a procedure to inject a medicine into the veins to make them disappear) if desired, but the treatment is not covered by insurance. Larger vessels may be covered if symptomatic and we would refer to vascular surgeon if treatment desired.  Acrochordons (Skin Tags) - Fleshy, skin-colored pedunculated papule at left axilla - Benign appearing.  - Observe. - If desired, they can be removed with an in office procedure that is not covered by insurance. - Please call the clinic if you notice any new or changing  lesions.   INTERTRIGO Exam Mild erythema inframammary fold  Chronic and persistent condition with duration or expected duration over one year. Condition is symptomatic/ bothersome to patient. Not currently at goal.  Intertrigo is a chronic  recurrent rash that occurs in skin fold areas that may be associated with friction; heat; moisture; yeast; fungus; and bacteria.  It is exacerbated by increased movement / activity; sweating; and higher atmospheric temperature.  Treatment Plan Patient deferred Rx treatment at this time.  Recommend OTC Zeasorb AF powder to body folds daily after shower.  It is often found in the athlete's foot section in the pharmacy.  Avoid using powders that contain cornstarch. May continue OTC antifungal creams every day/bid prn Use Hydrocortisone 1% cream a couple of times daily as needed for redness/itching at breast folds.   Inflamed seborrheic keratosis B/L Inframammary Folds  Patient deferred treatment at this time.    Return in about 1 year (around 10/13/2024) for TBSE, HxBCC.  I, Lawson Radar, CMA, am acting as scribe for Willeen Niece, MD.   Documentation: I have reviewed the above documentation for accuracy and completeness, and I agree with the above.  Willeen Niece, MD

## 2023-10-14 NOTE — Patient Instructions (Addendum)
Recommend OTC Zeasorb AF powder to body folds daily after shower.  It is often found in the athlete's foot section in the pharmacy.  Avoid using powders that contain cornstarch.  Use Hydrocortisone 1% cream a couple of times daily as needed for redness at breast folds.     Recommend daily broad spectrum sunscreen SPF 30+ to sun-exposed areas, reapply every 2 hours as needed. Call for new or changing lesions.  Staying in the shade or wearing long sleeves, sun glasses (UVA+UVB protection) and wide brim hats (4-inch brim around the entire circumference of the hat) are also recommended for sun protection.     Melanoma ABCDEs  Melanoma is the most dangerous type of skin cancer, and is the leading cause of death from skin disease.  You are more likely to develop melanoma if you: Have light-colored skin, light-colored eyes, or red or blond hair Spend a lot of time in the sun Tan regularly, either outdoors or in a tanning bed Have had blistering sunburns, especially during childhood Have a close family member who has had a melanoma Have atypical moles or large birthmarks  Early detection of melanoma is key since treatment is typically straightforward and cure rates are extremely high if we catch it early.   The first sign of melanoma is often a change in a mole or a new dark spot.  The ABCDE system is a way of remembering the signs of melanoma.  A for asymmetry:  The two halves do not match. B for border:  The edges of the growth are irregular. C for color:  A mixture of colors are present instead of an even brown color. D for diameter:  Melanomas are usually (but not always) greater than 6mm - the size of a pencil eraser. E for evolution:  The spot keeps changing in size, shape, and color.  Please check your skin once per month between visits. You can use a small mirror in front and a large mirror behind you to keep an eye on the back side or your body.   If you see any new or changing  lesions before your next follow-up, please call to schedule a visit.  Please continue daily skin protection including broad spectrum sunscreen SPF 30+ to sun-exposed areas, reapplying every 2 hours as needed when you're outdoors.   Staying in the shade or wearing long sleeves, sun glasses (UVA+UVB protection) and wide brim hats (4-inch brim around the entire circumference of the hat) are also recommended for sun protection.      Due to recent changes in healthcare laws, you may see results of your pathology and/or laboratory studies on MyChart before the doctors have had a chance to review them. We understand that in some cases there may be results that are confusing or concerning to you. Please understand that not all results are received at the same time and often the doctors may need to interpret multiple results in order to provide you with the best plan of care or course of treatment. Therefore, we ask that you please give [place] 2 business days to thoroughly review all your results before contacting the office for clarification. Should we see a critical lab result, you will be contacted sooner.   If You Need Anything After Your Visit  If you have any questions or concerns for your doctor, please call our main line at [phone] and press option 4 to reach your doctor's medical assistant. If no one answers, please leave a voicemail as  directed and we will return your call as soon as possible. Messages left after 4 pm will be answered the following business day.   You may also send [place] a message via MyChart. We typically respond to MyChart messages within 1-2 business days.  For prescription refills, please ask your pharmacy to contact our office. Our fax number is [phone].  If you have an urgent issue when the clinic is closed that cannot wait until the next business day, you can page your doctor at the number below.    Please note that while we do our best to be available for urgent  issues outside of office hours, we are not available 24/7.   If you have an urgent issue and are unable to reach [place], you may choose to seek medical care at your doctor's office, retail clinic, urgent care center, or emergency room.  If you have a medical emergency, please immediately call 911 or go to the emergency department.  Pager Numbers  - Dr. Gwen Pounds: [phone]  - Dr. Roseanne Reno: [phone]  - Dr. Katrinka Blazing: [phone]   In the event of inclement weather, please call our main line at [phone] for an update on the status of any delays or closures.  Dermatology Medication Tips: Please keep the boxes that topical medications come in in order to help keep track of the instructions about where and how to use these. Pharmacies typically print the medication instructions only on the boxes and not directly on the medication tubes.   If your medication is too expensive, please contact our office at [phone] option 4 or send [place] a message through MyChart.   We are unable to tell what your co-pay for medications will be in advance as this is different depending on your insurance coverage. However, we may be able to find a substitute medication at lower cost or fill out paperwork to get insurance to cover a needed medication.   If a prior authorization is required to get your medication covered by your insurance company, please allow [place] 1-2 business days to complete this process.  Drug prices often vary depending on where the prescription is filled and some pharmacies may offer cheaper prices.  The website www.goodrx.com contains coupons for medications through different pharmacies. The prices here do not account for what the cost may be with help from insurance (it may be cheaper with your insurance), but the website can give you the price if you did not use any insurance.  - You can print the associated coupon and take it with your prescription to the pharmacy.  - You may also stop  by our office during regular business hours and pick up a GoodRx coupon card.  - If you need your prescription sent electronically to a different pharmacy, notify our office through [facility] or by phone at [phone] option 4.     Si Usted Necesita Algo Despus de Su Visita  Tambin puede enviarnos un mensaje a travs de Clinical cytogeneticist. Por lo general respondemos a los mensajes de MyChart en el transcurso de 1 a 2 das hbiles.  Para renovar recetas, por favor pida a su farmacia que se ponga en contacto con nuestra oficina. Annie Sable de fax es [place] [phone].  Si tiene un asunto urgente cuando la clnica est cerrada y que no puede esperar hasta el siguiente da hbil, puede llamar/localizar a su doctor(a) al nmero que aparece a continuacin.   Por favor, tenga en cuenta que aunque hacemos todo lo posible  para estar disponibles para asuntos urgentes fuera del horario de [place], no estamos disponibles las 24 horas del da, los 7 [address]  [address] de la [place].   Si tiene un problema urgente y no puede comunicarse con nosotros, puede optar por buscar atencin mdica  en el consultorio de su doctor(a), en una clnica privada, en un centro de atencin urgente o en una sala de emergencias.  Si tiene Engineer, drilling, por favor llame inmediatamente al 911 o vaya a la sala de emergencias.  Nmeros de bper  - Dr. Gwen Pounds: [phone]  - Dra. Roseanne Reno: [phone]  - Dr. Katrinka Blazing: [phone]   En caso de inclemencias del tiempo, por favor llame a Lacy Duverney principal al [phone] para una actualizacin sobre el [place] de cualquier retraso o cierre.  Consejos para la medicacin en dermatologa: Por favor, guarde las cajas en las que vienen los medicamentos de uso tpico para ayudarle a seguir las instrucciones sobre dnde y cmo usarlos. Las farmacias generalmente imprimen las instrucciones del medicamento slo en las cajas y no directamente en los tubos del [place].   Si su  medicamento es muy caro, por favor, pngase en contacto con Rolm Gala llamando al [phone] y presione la opcin 4 o envenos un mensaje a travs de Clinical cytogeneticist.   No podemos decirle cul ser su copago por los medicamentos por adelantado ya que esto es diferente dependiendo de la cobertura de su seguro. Sin embargo, es posible que podamos encontrar un medicamento sustituto a Audiological scientist un formulario para que el seguro cubra el medicamento que se considera necesario.   Si se requiere una autorizacin previa para que su compaa de seguros [place] su medicamento, por favor permtanos de 1 a 2 das hbiles para completar [address]  Los precios de los medicamentos varan con frecuencia dependiendo del Environmental consultant de dnde se surte la receta y alguna farmacias pueden ofrecer precios ms baratos.  El sitio web www.goodrx.com tiene cupones para medicamentos de Health and safety inspector. Los precios aqu no tienen en cuenta lo que podra costar con la ayuda del seguro (puede ser ms barato con su seguro), pero el sitio web puede darle el precio si no utiliz Tourist information centre manager.  - Puede imprimir el cupn correspondiente y llevarlo con su receta a la farmacia.  - Tambin puede pasar por nuestra oficina durante el horario de atencin regular y Education officer, museum una tarjeta de cupones de GoodRx.  - Si necesita que su receta se enve electrnicamente a una farmacia diferente, informe a nuestra oficina a travs de MyChart de [place] o por telfono llamando al [phone] y presione la opcin 4.

## 2023-10-23 ENCOUNTER — Ambulatory Visit: Payer: Medicare HMO | Admitting: Urology

## 2023-10-23 ENCOUNTER — Encounter: Payer: Self-pay | Admitting: Urology

## 2023-10-23 VITALS — BP 120/80 | HR 74 | Ht <= 58 in | Wt 146.0 lb

## 2023-10-23 DIAGNOSIS — R8281 Pyuria: Secondary | ICD-10-CM

## 2023-10-23 DIAGNOSIS — Z87440 Personal history of urinary (tract) infections: Secondary | ICD-10-CM

## 2023-10-23 DIAGNOSIS — N390 Urinary tract infection, site not specified: Secondary | ICD-10-CM

## 2023-10-23 DIAGNOSIS — N2889 Other specified disorders of kidney and ureter: Secondary | ICD-10-CM

## 2023-10-23 LAB — URINALYSIS, COMPLETE
Bilirubin, UA: NEGATIVE
Glucose, UA: NEGATIVE
Ketones, UA: NEGATIVE
Nitrite, UA: NEGATIVE
Protein,UA: NEGATIVE
RBC, UA: NEGATIVE
Specific Gravity, UA: 1.02 (ref 1.005–1.030)
Urobilinogen, Ur: 0.2 mg/dL (ref 0.2–1.0)
pH, UA: 6 (ref 5.0–7.5)

## 2023-10-23 LAB — MICROSCOPIC EXAMINATION
Epithelial Cells (non renal): >10 /[HPF] — AB (ref 0–10)
WBC, UA: >30 /[HPF] — AB (ref 0–5)

## 2023-10-24 ENCOUNTER — Other Ambulatory Visit: Payer: Self-pay

## 2023-10-24 ENCOUNTER — Other Ambulatory Visit: Payer: Self-pay | Admitting: Physician Assistant

## 2023-10-24 DIAGNOSIS — I10 Essential (primary) hypertension: Secondary | ICD-10-CM

## 2023-10-24 DIAGNOSIS — M5432 Sciatica, left side: Secondary | ICD-10-CM

## 2023-10-24 MED ORDER — GABAPENTIN 100 MG PO CAPS: 100.0000 mg | ORAL_CAPSULE | Freq: Three times a day (TID) | ORAL | 1 refills | Status: DC | PRN

## 2023-10-28 DIAGNOSIS — G3184 Mild cognitive impairment, so stated: Secondary | ICD-10-CM | POA: Diagnosis not present

## 2023-11-04 DIAGNOSIS — Z961 Presence of intraocular lens: Secondary | ICD-10-CM | POA: Diagnosis not present

## 2023-11-04 DIAGNOSIS — H57812 Brow ptosis, left: Secondary | ICD-10-CM | POA: Diagnosis not present

## 2023-11-04 DIAGNOSIS — H02834 Dermatochalasis of left upper eyelid: Secondary | ICD-10-CM | POA: Diagnosis not present

## 2023-11-04 DIAGNOSIS — H18593 Other hereditary corneal dystrophies, bilateral: Secondary | ICD-10-CM | POA: Diagnosis not present

## 2023-11-05 ENCOUNTER — Other Ambulatory Visit: Payer: Self-pay | Admitting: Physician Assistant

## 2023-11-07 ENCOUNTER — Ambulatory Visit: Payer: Medicare HMO | Admitting: Physician Assistant

## 2023-11-07 VITALS — BP 152/70 | HR 71 | Ht <= 58 in | Wt 152.0 lb

## 2023-11-07 DIAGNOSIS — R350 Frequency of micturition: Secondary | ICD-10-CM

## 2023-11-07 DIAGNOSIS — R82998 Other abnormal findings in urine: Secondary | ICD-10-CM | POA: Diagnosis not present

## 2023-11-07 DIAGNOSIS — Z87440 Personal history of urinary (tract) infections: Secondary | ICD-10-CM | POA: Diagnosis not present

## 2023-11-07 DIAGNOSIS — N390 Urinary tract infection, site not specified: Secondary | ICD-10-CM

## 2023-11-07 LAB — URINALYSIS, COMPLETE
Bilirubin, UA: NEGATIVE
Glucose, UA: NEGATIVE
Ketones, UA: NEGATIVE
Nitrite, UA: NEGATIVE
Protein,UA: NEGATIVE
Specific Gravity, UA: 1.01 (ref 1.005–1.030)
Urobilinogen, Ur: 0.2 mg/dL (ref 0.2–1.0)
pH, UA: 6 (ref 5.0–7.5)

## 2023-11-07 LAB — MICROSCOPIC EXAMINATION: WBC, UA: >30 /[HPF] — AB (ref 0–5)

## 2023-11-07 LAB — BLADDER SCAN AMB NON-IMAGING: Scan Result: 30

## 2023-11-07 MED ORDER — CEFUROXIME AXETIL 250 MG PO TABS: 250.0000 mg | ORAL_TABLET | Freq: Two times a day (BID) | ORAL | 0 refills | Status: AC

## 2023-11-07 NOTE — Progress Notes (Signed)
11/07/2023 11:10 AM   Cynthia Keith 1946-03-20 [id number]  CC: Chief Complaint  Patient presents with   Urinary Frequency   HPI: Cynthia Keith is a 77 y.o. female with PMH mild cognitive impairment, POP s/p bladder sling with Dr. Jenne Campus at [facility] in 2016, right renal mass on surveillance, recurrent UTI previously on cranberry supplements and topical vaginal estrogen cream, and OAB wet previously on Myrbetriq 25 mg who presents today for evaluation of possible UTI.   Today she reports treated for a UTI with antibiotics a couple of weeks ago and her symptoms resolved, but she had recurrence of dysuria and frequency about 2 days ago.  She is unsure who prescribed those antibiotics.  I do not see any records of this in her chart.  She is no longer on cranberry supplements or using estrogen cream.  She would like to defer suppressive antibiotics and continue to treat her infections as they occur.  In-office UA today positive for trace intact blood and 1+ leukocytes; urine microscopy with >30 WBCs/HPF, 3-10 RBCs/HPF, and many bacteria. PVR 30mL.  PMH: Past Medical History:  Diagnosis Date   Actinic keratosis 03/15/2021   L post thigh    Basal cell carcinoma 06/21/2021   L alar crease, EDC done 07/31/21   GERD (gastroesophageal reflux disease)    Hyperlipidemia    Hypertension    Malignant essential hypertension 02/26/2017   Plantar fasciitis    Sleep apnea    Urinary tract infection     Surgical History: Past Surgical History:  Procedure Laterality Date   ABDOMINAL HYSTERECTOMY     COLONOSCOPY WITH PROPOFOL N/A 12/12/2015   Procedure: COLONOSCOPY WITH PROPOFOL;  Surgeon: Scot Jun, MD;  [facility] END[facility];  Service: Endoscopy;  Laterality: N/A;   HAND SURGERY  2005    Home Medications:  Allergies as of 11/07/2023       Reactions   Iodinated Contrast Media Anaphylaxis   Remote hx of respiratory arrest after IV iodine for kidney study. Immediately treated w/o  sequela. No previous or subsequent IVP exposure. Remote hx of respiratory arrest after IV iodine for kidney study. Immediately treated w/o sequela. No previous or subsequent IVP exposure. Remote hx of respiratory arrest after IV iodine for kidney study. Immediately treated w/o sequela. No previous or subsequent IVP exposure.   Iodine Shortness Of Breath   "Quit Breathing"        Medication List        Accurate as of November 07, 2023 11:10 AM. If you have any questions, ask your nurse or doctor.          STOP taking these medications    benzonatate 200 MG capsule Commonly known as: TESSALON   ondansetron 4 MG disintegrating tablet Commonly known as: ZOFRAN-ODT   potassium chloride 10 MEQ tablet Commonly known as: KLOR-CON       TAKE these medications    amLODipine 10 MG tablet Commonly known as: NORVASC TAKE 1 TABLET BY MOUTH EVERY DAY   aspirin EC 81 MG tablet Take 81 mg by mouth daily.   chlorpheniramine-HYDROcodone 10-8 MG/5ML Commonly known as: TUSSIONEX Take 5 mLs by mouth at bedtime as needed for cough.   cyclobenzaprine 5 MG tablet Commonly known as: FLEXERIL Take 1 tablet (5 mg total) by mouth at bedtime.   donepezil 5 MG tablet Commonly known as: ARICEPT Take 1 tablet by mouth at bedtime.   estradiol 0.1 MG/GM vaginal cream Commonly known as: ESTRACE Apply one pea-sized  amount around the opening of the urethra daily for 2 weeks, then 3 times weekly moving forward.   gabapentin 100 MG capsule Commonly known as: NEURONTIN Take 1 capsule (100 mg total) by mouth 3 (three) times daily as needed.   HAIR SKIN & NAILS PO Take by mouth.   hydrALAZINE 10 MG tablet Commonly known as: APRESOLINE TAKE 1 TABLET BY MOUTH THREE TIMES A DAY   hydrochlorothiazide 12.5 MG tablet Commonly known as: HYDRODIURIL TAKE 1 TABLET BY MOUTH EVERY DAY   lisinopril 5 MG tablet Commonly known as: ZESTRIL Take 1 tablet by mouth daily.   omeprazole 40 MG  capsule Commonly known as: PRILOSEC TAKE 1 CAPSULE (40 MG TOTAL) BY MOUTH DAILY.   OVER THE COUNTER MEDICATION Doterra-EO Mega/AlphaCRS/MIcro Plex VM/Deep Blue Veg capsules   rosuvastatin 5 MG tablet Commonly known as: CRESTOR TAKE 1 TABLET (5 MG TOTAL) BY MOUTH DAILY.        Allergies:  Allergies  Allergen Reactions   Iodinated Contrast Media Anaphylaxis    Remote hx of respiratory arrest after IV iodine for kidney study. Immediately treated w/o sequela. No previous or subsequent IVP exposure. Remote hx of respiratory arrest after IV iodine for kidney study. Immediately treated w/o sequela. No previous or subsequent IVP exposure. Remote hx of respiratory arrest after IV iodine for kidney study. Immediately treated w/o sequela. No previous or subsequent IVP exposure.   Iodine Shortness Of Breath    "Quit Breathing"    Family History: Family History  Problem Relation Age of Onset   Breast cancer Cousin    Stroke Mother    Heart disease Paternal Grandmother    Cancer Paternal Grandfather    Stroke Sister    Stroke Brother     Social History:   reports that she has never smoked. She has never used smokeless tobacco. She reports current alcohol use. She reports that she does not use drugs.  Physical Exam: BP (!) 152/70   Pulse 71   Ht 4' 8.5" (1.435 m)   Wt 152 lb (68.9 kg)   BMI 33.48 kg/m   Constitutional:  Alert and oriented, no acute distress, nontoxic appearing HEENT: [place], AT Cardiovascular: No clubbing, cyanosis, or edema Respiratory: Normal respiratory effort, no increased work of breathing Skin: No rashes, bruises or suspicious lesions Neurologic: Grossly intact, no focal deficits, moving all 4 extremities Psychiatric: Normal mood and affect  Laboratory Data: Results for orders placed or performed in visit on 11/07/23  Microscopic Examination   Urine  Result Value Ref Range   WBC, UA >30 (A) 0 - 5 /hpf   RBC, Urine 3-10 (A) 0 - 2 /hpf   Epithelial  Cells (non renal) 0-10 0 - 10 /hpf   Bacteria, UA Many (A) None seen/Few  Urinalysis, Complete  Result Value Ref Range   Specific Gravity, UA 1.010 1.005 - 1.030   pH, UA 6.0 5.0 - 7.5   Color, UA Yellow Yellow   Appearance Ur Cloudy (A) Clear   Leukocytes,UA 1+ (A) Negative   Protein,UA Negative Negative/Trace   Glucose, UA Negative Negative   Ketones, UA Negative Negative   RBC, UA Trace (A) Negative   Bilirubin, UA Negative Negative   Urobilinogen, Ur 0.2 0.2 - 1.0 mg/dL   Nitrite, UA Negative Negative   Microscopic Examination See below:   Bladder Scan (Post Void Residual) in office  Result Value Ref Range   Scan Result 30    Assessment & Plan:   1. Recurrent UTI  UA appears grossly infected, will start empiric cefuroxime and send for culture for further evaluation.  Will consider suppressive antibiotics in the future if these continue to be frequent. - Urinalysis, Complete - Bladder Scan (Post Void Residual) in office - CULTURE, URINE COMPREHENSIVE - cefUROXime (CEFTIN) 250 MG tablet; Take 1 tablet (250 mg total) by mouth 2 (two) times daily with a meal for 5 days.  Dispense: 10 tablet; Refill: 0   Return if symptoms worsen or fail to improve.  Carman Ching, PA-C  [facility] [address], [address] [place], [place] [address] [phone]

## 2023-11-10 LAB — CULTURE, URINE COMPREHENSIVE

## 2023-11-11 ENCOUNTER — Encounter: Payer: Self-pay | Admitting: Physician Assistant

## 2023-11-11 ENCOUNTER — Ambulatory Visit (INDEPENDENT_AMBULATORY_CARE_PROVIDER_SITE_OTHER): Payer: Medicare HMO | Admitting: Physician Assistant

## 2023-11-11 VITALS — BP 138/70 | HR 74 | Temp 97.8°F | Resp 16 | Ht <= 58 in | Wt 150.8 lb

## 2023-11-11 DIAGNOSIS — I10 Essential (primary) hypertension: Secondary | ICD-10-CM

## 2023-11-11 DIAGNOSIS — R413 Other amnesia: Secondary | ICD-10-CM | POA: Diagnosis not present

## 2023-11-11 NOTE — Progress Notes (Signed)
[facility] [address] [place], [place] [address]  Internal MEDICINE  Office Visit Note  Patient Name: Cynthia Keith  [id number]  [id number]  Date of Service: 11/11/2023  Chief Complaint  Patient presents with   Follow-up   Gastroesophageal Reflux   Hypertension   Hyperlipidemia   Quality Metric Gaps    Shingles and TDAP    HPI Pt is here for routine follow up -Concerned about memory and seeing neurology and recently started on Aricept -Working regularly still and stays busy at home when not working, often baking -pt not sure if taking hydralazine TID and will check, BP did improve on recheck. Pt is not on ACEI/ARB anymore-unclear why it was re-added by outside provider. Removed from list again and confirmed with pt that she did not restart this due to S/E on this previously. -does take ibuprofen or gabapentin as needed for back pain/sciatica at times. Doing better today  Current Medication: Outpatient Encounter Medications as of 11/11/2023  Medication Sig Note   amLODipine (NORVASC) 10 MG tablet TAKE 1 TABLET BY MOUTH EVERY DAY    aspirin EC 81 MG tablet Take 81 mg by mouth daily.    cefUROXime (CEFTIN) 250 MG tablet Take 1 tablet (250 mg total) by mouth 2 (two) times daily with a meal for 5 days.    donepezil (ARICEPT) 5 MG tablet Take 1 tablet by mouth at bedtime.    gabapentin (NEURONTIN) 100 MG capsule Take 100 mg by mouth at bedtime.    hydrALAZINE (APRESOLINE) 10 MG tablet TAKE 1 TABLET BY MOUTH THREE TIMES A DAY    hydrochlorothiazide (HYDRODIURIL) 12.5 MG tablet TAKE 1 TABLET BY MOUTH EVERY DAY    Multiple Vitamins-Minerals (HAIR SKIN & NAILS PO) Take by mouth.    omeprazole (PRILOSEC) 40 MG capsule TAKE 1 CAPSULE (40 MG TOTAL) BY MOUTH DAILY.    OVER THE COUNTER MEDICATION Doterra-EO Mega/AlphaCRS/MIcro Plex VM/Deep Blue Veg capsules    rosuvastatin (CRESTOR) 5 MG tablet TAKE 1 TABLET (5 MG TOTAL) BY MOUTH DAILY.    [DISCONTINUED] lisinopril  (ZESTRIL) 5 MG tablet Take 1 tablet by mouth daily. 11/11/2023: cough   No facility-administered encounter medications on file as of 11/11/2023.    Surgical History: Past Surgical History:  Procedure Laterality Date   ABDOMINAL HYSTERECTOMY     COLONOSCOPY WITH PROPOFOL N/A 12/12/2015   Procedure: COLONOSCOPY WITH PROPOFOL;  Surgeon: Scot Jun, MD;  Location: [facility] ENDOSCOPY;  Service: Endoscopy;  Laterality: N/A;   HAND SURGERY  2005    Medical History: Past Medical History:  Diagnosis Date   Actinic keratosis 03/15/2021   L post thigh    Basal cell carcinoma 06/21/2021   L alar crease, EDC done 07/31/21   GERD (gastroesophageal reflux disease)    Hyperlipidemia    Hypertension    Malignant essential hypertension 02/26/2017   Plantar fasciitis    Sleep apnea    Urinary tract infection     Family History: Family History  Problem Relation Age of Onset   Breast cancer Cousin    Stroke Mother    Heart disease Paternal Grandmother    Cancer Paternal Grandfather    Stroke Sister    Stroke Brother     Social History   Socioeconomic History   Marital status: Married    Spouse name: Not on file   Number of children: Not on file   Years of education: Not on file   Highest education level: Not on file  Occupational History   Not on file  Tobacco Use   Smoking status: Never   Smokeless tobacco: Never  Vaping Use   Vaping status: Never Used  Substance and Sexual Activity   Alcohol use: Yes    Comment: ocassionally   Drug use: No   Sexual activity: Yes    Birth control/protection: Post-menopausal, Surgical  Other Topics Concern   Not on file  Social History Narrative   Not on file   Social Determinants of Health   Financial Resource Strain: Not on file  Food Insecurity: No Food Insecurity (10/20/2022)   Hunger Vital Sign    Worried About Running Out of Food in the Last Year: Never true    Ran Out of Food in the Last Year: Never true  Transportation  Needs: No Transportation Needs (10/20/2022)   PRAPARE - Administrator, Civil Service (Medical): No    Lack of Transportation (Non-Medical): No  Physical Activity: Not on file  Stress: Not on file  Social Connections: Not on file  Intimate Partner Violence: Not At Risk (10/20/2022)   Humiliation, Afraid, Rape, and Kick questionnaire    Fear of Current or Ex-Partner: No    Emotionally Abused: No    Physically Abused: No    Sexually Abused: No      Review of Systems  Constitutional:  Negative for chills, fatigue and unexpected weight change.  HENT:  Negative for congestion, postnasal drip, rhinorrhea, sneezing and sore throat.   Eyes:  Negative for redness.  Respiratory:  Negative for chest tightness, shortness of breath and wheezing.   Cardiovascular:  Negative for chest pain and palpitations.  Gastrointestinal:  Negative for abdominal pain, constipation, diarrhea, nausea and vomiting.  Genitourinary:  Negative for dysuria and frequency.  Musculoskeletal:  Negative for arthralgias, joint swelling and neck pain.  Skin:  Negative for rash.  Neurological: Negative.  Negative for tremors and numbness.  Hematological:  Negative for adenopathy. Does not bruise/bleed easily.  Psychiatric/Behavioral:  Positive for confusion. Negative for behavioral problems (Depression), sleep disturbance and suicidal ideas. The patient is not nervous/anxious.        Memory loss    Vital Signs: BP 138/70 Comment: 150/70  Pulse 74   Temp 97.8 F (36.6 C)   Resp 16   Ht 4\' 8"  (1.422 m)   Wt 150 lb 12.8 oz (68.4 kg)   SpO2 99%   BMI 33.81 kg/m    Physical Exam Vitals and nursing note reviewed.  Constitutional:      General: She is not in acute distress.    Appearance: Normal appearance. She is well-developed. She is obese. She is not diaphoretic.  HENT:     Head: Normocephalic and atraumatic.     Mouth/Throat:     Pharynx: No oropharyngeal exudate.  Eyes:     Pupils: Pupils are  equal, round, and reactive to light.  Neck:     Thyroid: No thyromegaly.     Vascular: No JVD.     Trachea: No tracheal deviation.  Cardiovascular:     Rate and Rhythm: Normal rate and regular rhythm.     Heart sounds: Normal heart sounds. No murmur heard.    No friction rub. No gallop.  Pulmonary:     Effort: Pulmonary effort is normal. No respiratory distress.     Breath sounds: No wheezing or rales.  Chest:     Chest wall: No tenderness.  Abdominal:     General: Bowel sounds are normal.  Palpations: Abdomen is soft.  Musculoskeletal:        General: Normal range of motion.     Cervical back: Normal range of motion and neck supple.  Lymphadenopathy:     Cervical: No cervical adenopathy.  Skin:    General: Skin is warm and dry.  Neurological:     Mental Status: She is alert and oriented to person, place, and time.     Cranial Nerves: No cranial nerve deficit.  Psychiatric:        Behavior: Behavior normal.        Thought Content: Thought content normal.        Judgment: Judgment normal.        Assessment/Plan: 1. Essential hypertension Stable on recheck, pt will confirm home medications with list and call if any problems  2. Memory loss Followed by neurology--started on aricept last visit   General Counseling: colbee kovaleski understanding of the findings of todays visit and agrees with plan of treatment. I have discussed any further diagnostic evaluation that may be needed or ordered today. We also reviewed her medications today. she has been encouraged to call the office with any questions or concerns that should arise related to todays visit.    No orders of the defined types were placed in this encounter.   No orders of the defined types were placed in this encounter.   This patient was seen by Lynn Ito, PA-C in collaboration with Dr. Beverely Risen as a part of collaborative care agreement.   Total time spent:30 Minutes Time spent includes  review of chart, medications, test results, and follow up plan with the patient.      Dr Lyndon Code Internal medicine

## 2023-11-13 ENCOUNTER — Telehealth: Payer: Self-pay

## 2023-11-13 NOTE — Telephone Encounter (Signed)
Pt called that she still have dry cough advised take med as prescribed and also take Mucinex OTC  and drink plenty of water

## 2023-11-28 ENCOUNTER — Other Ambulatory Visit: Payer: Self-pay

## 2023-11-28 ENCOUNTER — Telehealth: Payer: Self-pay

## 2023-11-28 MED ORDER — AZITHROMYCIN 250 MG PO TABS: ORAL_TABLET | ORAL | 0 refills | Status: AC

## 2023-11-28 NOTE — Telephone Encounter (Signed)
Patient called in sick with congestion and possible fever. Per Leotis Shames, sent Z-Pack for 5 days.

## 2023-11-29 ENCOUNTER — Encounter: Payer: Self-pay | Admitting: Physician Assistant

## 2023-11-29 ENCOUNTER — Ambulatory Visit (INDEPENDENT_AMBULATORY_CARE_PROVIDER_SITE_OTHER): Payer: Medicare HMO | Admitting: Physician Assistant

## 2023-11-29 VITALS — BP 112/65 | HR 83 | Temp 98.0°F | Resp 16 | Ht <= 58 in | Wt 150.0 lb

## 2023-11-29 DIAGNOSIS — R52 Pain, unspecified: Secondary | ICD-10-CM | POA: Diagnosis not present

## 2023-11-29 DIAGNOSIS — J069 Acute upper respiratory infection, unspecified: Secondary | ICD-10-CM

## 2023-11-29 LAB — POCT INFLUENZA A/B
Influenza A, POC: NEGATIVE
Influenza B, POC: NEGATIVE

## 2023-11-29 NOTE — Progress Notes (Signed)
[facility] [address] [place], [place] [address]  Internal MEDICINE  Office Visit Note  Patient Name: Cynthia Keith  [id number]  [id number]  Date of Service: 12/10/2023  Chief Complaint  Patient presents with   Acute Visit   Nasal Congestion   Generalized Body Aches   Cough   Shortness of Breath     HPI Pt is here for a sick visit. -Has not been feeling well for the last week and has been getting worse -Sinus congestion, sore throat, coughing, fatigued. Some chills and aches.  -negative for covid this morning, flu test will be done in office -Not taking anything in a few days. Zpak was called in for her yesterday when she called office, but pt did not pick this up yet and will do so now. -drinking water and tea  Current Medication:  Outpatient Encounter Medications as of 11/29/2023  Medication Sig   amLODipine (NORVASC) 10 MG tablet TAKE 1 TABLET BY MOUTH EVERY DAY   aspirin EC 81 MG tablet Take 81 mg by mouth daily.   [EXPIRED] azithromycin (ZITHROMAX) 250 MG tablet Take 2 tablets on day 1, then 1 tablet daily on days 2 through 5   donepezil (ARICEPT) 5 MG tablet Take 1 tablet by mouth at bedtime.   gabapentin (NEURONTIN) 100 MG capsule Take 100 mg by mouth at bedtime.   hydrochlorothiazide (HYDRODIURIL) 12.5 MG tablet TAKE 1 TABLET BY MOUTH EVERY DAY   Multiple Vitamins-Minerals (HAIR SKIN & NAILS PO) Take by mouth.   omeprazole (PRILOSEC) 40 MG capsule TAKE 1 CAPSULE (40 MG TOTAL) BY MOUTH DAILY.   OVER THE COUNTER MEDICATION Doterra-EO Mega/AlphaCRS/MIcro Plex VM/Deep Blue Veg capsules   rosuvastatin (CRESTOR) 5 MG tablet TAKE 1 TABLET (5 MG TOTAL) BY MOUTH DAILY.   [DISCONTINUED] hydrALAZINE (APRESOLINE) 10 MG tablet TAKE 1 TABLET BY MOUTH THREE TIMES A DAY   No facility-administered encounter medications on file as of 11/29/2023.      Medical History: Past Medical History:  Diagnosis Date   Actinic keratosis 03/15/2021   L post thigh     Basal cell carcinoma 06/21/2021   L alar crease, EDC done 07/31/21   GERD (gastroesophageal reflux disease)    Hyperlipidemia    Hypertension    Malignant essential hypertension 02/26/2017   Plantar fasciitis    Sleep apnea    Urinary tract infection      Vital Signs: BP 112/65   Pulse 83   Temp 98 F (36.7 C)   Resp 16   Ht 4\' 8"  (1.422 m)   Wt 150 lb (68 kg)   SpO2 97%   BMI 33.63 kg/m    Review of Systems  Constitutional:  Positive for fatigue. Negative for fever.  HENT:  Positive for congestion, postnasal drip and sore throat. Negative for mouth sores.   Respiratory:  Positive for cough.   Cardiovascular:  Negative for chest pain.  Genitourinary:  Negative for flank pain.  Musculoskeletal:  Positive for myalgias.  Psychiatric/Behavioral: Negative.      Physical Exam Vitals and nursing note reviewed.  Constitutional:      Appearance: She is well-developed.  HENT:     Head: Normocephalic and atraumatic.     Nose: Congestion present.     Mouth/Throat:     Pharynx: Posterior oropharyngeal erythema present.  Eyes:     Pupils: Pupils are equal, round, and reactive to light.  Cardiovascular:     Rate and Rhythm: Normal rate and regular rhythm.  Pulmonary:     Effort: Pulmonary effort is normal.     Breath sounds: Normal breath sounds.  Skin:    General: Skin is warm and dry.  Neurological:     Mental Status: She is alert.  Psychiatric:        Mood and Affect: Mood normal.        Behavior: Behavior normal.       Assessment/Plan: 1. Upper respiratory tract infection, unspecified type (Primary) Sent zpak yesterday when pt called in, but has not started ut yet and will pick up now. May use nasal spray, mucinex, and warm tea with honey/lozenges to help throat. Call if new or worsening symptoms. Advised to stay well hydrated and rest - POCT Influenza A/B negative     General Counseling: lateia brumbley understanding of the findings of todays visit and  agrees with plan of treatment. I have discussed any further diagnostic evaluation that may be needed or ordered today. We also reviewed her medications today. she has been encouraged to call the office with any questions or concerns that should arise related to todays visit.    Counseling:    Orders Placed This Encounter  Procedures   POCT Influenza A/B    No orders of the defined types were placed in this encounter.   Time spent:25 Minutes

## 2023-12-08 ENCOUNTER — Other Ambulatory Visit: Payer: Self-pay | Admitting: Physician Assistant

## 2023-12-08 DIAGNOSIS — I10 Essential (primary) hypertension: Secondary | ICD-10-CM

## 2023-12-19 ENCOUNTER — Encounter: Payer: Self-pay | Admitting: Cardiovascular Disease

## 2023-12-19 ENCOUNTER — Ambulatory Visit: Payer: Medicare HMO | Attending: Cardiovascular Disease | Admitting: Cardiovascular Disease

## 2023-12-19 VITALS — BP 140/62 | HR 76 | Ht <= 58 in | Wt 149.5 lb

## 2023-12-19 DIAGNOSIS — I700 Atherosclerosis of aorta: Secondary | ICD-10-CM

## 2023-12-19 DIAGNOSIS — R0609 Other forms of dyspnea: Secondary | ICD-10-CM

## 2023-12-19 DIAGNOSIS — I10 Essential (primary) hypertension: Secondary | ICD-10-CM

## 2023-12-19 DIAGNOSIS — E782 Mixed hyperlipidemia: Secondary | ICD-10-CM | POA: Diagnosis not present

## 2023-12-19 DIAGNOSIS — E785 Hyperlipidemia, unspecified: Secondary | ICD-10-CM

## 2023-12-19 DIAGNOSIS — I25118 Atherosclerotic heart disease of native coronary artery with other forms of angina pectoris: Secondary | ICD-10-CM | POA: Diagnosis not present

## 2023-12-19 MED ORDER — ROSUVASTATIN CALCIUM 20 MG PO TABS: 20.0000 mg | ORAL_TABLET | Freq: Every day | ORAL | 1 refills | Status: DC

## 2023-12-19 NOTE — Patient Instructions (Addendum)
Medication Instructions:  INCREASE the Rosuvastatin to 20 mg once daily  *If you need a refill on your cardiac medications before your next appointment, please call your pharmacy*   Lab Work: Your provider would like for you to return in 2 months to have the following labs drawn: Lipid and Liver.   Please go to [facility] [address] ([facility] [address], [place] [address] You do not need an appointment.  They are open from 7:30 am-4 pm.  Lunch from 1:00 pm- 2:00 pm You will need to be fasting.  If you have labs (blood work) drawn today and your tests are completely normal, you will receive your results only by: MyChart Message (if you have MyChart) OR A paper copy in the mail If you have any lab test that is abnormal or we need to change your treatment, we will call you to review the results.   Testing/Procedures: CARDIAC PET- Your physician has requested that you have a Cardiac Pet Stress Test.   This testing is completed at [facility] ([address] [place], [place] [address]) or [facility] ([address], [place], [place]). Please arrive 30 minutes prior to your scheduled time.  The schedulers will call you to get this scheduled. Please follow further testing instructions below.    Follow-Up: At [facility], you and your health needs are our priority.  As part of our continuing mission to provide you with exceptional heart care, we have created designated Provider Care Teams.  These Care Teams include your primary Cardiologist (physician) and Advanced Practice Providers (APPs -  Physician Assistants and Nurse Practitioners) who all work together to provide you with the care you need, when you need it.  We recommend signing up for the patient portal called "MyChart".  Sign up information is provided on this After Visit Summary.  MyChart is used to connect with patients for Virtual Visits  (Telemedicine).  Patients are able to view lab/test results, encounter notes, upcoming appointments, etc.  Non-urgent messages can be sent to your provider as well.   To learn more about what you can do with MyChart, go to [email or web address].    Your next appointment:   6 month(s)  Provider:   You may see Dr. Kirke Corin or one of the following Advanced Practice Providers on your designated Care Team:   Nicolasa Ducking, NP Eula Listen, PA-C Cadence Fransico Michael, PA-C Charlsie Quest, NP Carlos Levering, NP    Other Instructions    Please report to Radiology at the [facility] Main Entrance 30 minutes early for your test.  [address] [place], [place] [address]                         OR   Please report to Radiology at [facility] Main Entrance, medical mall, 30 mins prior to your test.  [address]  [place], [place]  [phone]  How to Prepare for Your Cardiac PET/CT Stress Test:  Nothing to eat or drink, except water, 3 hours prior to arrival time.  NO caffeine/decaffeinated products, or chocolate 12 hours prior to arrival. (Please note decaffeinated beverages (teas/coffees) still contain caffeine).  If you have caffeine within 12 hours prior, the test will need to be rescheduled.  Medication instructions:   -hold the hydrochlorothiazide the morning of the test  Diabetic Preparation: If able to eat breakfast prior to 3 hour fasting, you  may take all medications, including your insulin. Do not worry if you miss your breakfast dose of insulin - start at your next meal. If you do not eat prior to 3 hour fast-Hold all diabetes (oral and insulin) medications. Patients who wear a continuous glucose monitor MUST remove the device prior to scanning.  You may take your remaining medications with water.  NO perfume, cologne or lotion on chest or abdomen area. FEMALES - Please avoid wearing dresses to this appointment.  Total time is 1 to 2  hours; you may want to bring reading material for the waiting time.  IF YOU THINK YOU MAY BE PREGNANT, OR ARE NURSING PLEASE INFORM THE TECHNOLOGIST.  In preparation for your appointment, medication and supplies will be purchased.  Appointment availability is limited, so if you need to cancel or reschedule, please call the Radiology Department at [phone] [facility]) OR [phone] [facility]) 24 hours in advance to avoid a cancellation fee of $100.00  What to Expect When you Arrive:  Once you arrive and check in for your appointment, you will be taken to a preparation room within the Radiology Department.  A technologist or Nurse will obtain your medical history, verify that you are correctly prepped for the exam, and explain the procedure.  Afterwards, an IV will be started in your arm and electrodes will be placed on your skin for EKG monitoring during the stress portion of the exam. Then you will be escorted to the PET/CT scanner.  There, staff will get you positioned on the scanner and obtain a blood pressure and EKG.  During the exam, you will continue to be connected to the EKG and blood pressure machines.  A small, safe amount of a radioactive tracer will be injected in your IV to obtain a series of pictures of your heart along with an injection of a stress agent.    After your Exam:  It is recommended that you eat a meal and drink a caffeinated beverage to counter act any effects of the stress agent.  Drink plenty of fluids for the remainder of the day and urinate frequently for the first couple of hours after the exam.  Your doctor will inform you of your test results within 7-10 business days.  For more information and frequently asked questions, please visit our website: [email or web address]  For questions about your test or how to prepare for your test, please call: Cardiac Imaging Nurse Navigators Office: [phone]

## 2023-12-19 NOTE — Progress Notes (Signed)
Cardiology Office Note   Date:  12/19/2023   ID:  Cynthia, Keith 11-16-1946, MRN [id number]  PCP:  Carlean Jews, PA-C  Cardiologist:   Lorine Bears, MD   Chief Complaint  Patient presents with   New Patient (Initial Visit)    CAD no complaints today. Meds reviewed verbally with pt.      History of Present Illness: Cynthia Keith is a 77 y.o. female who was referred for evaluation of coronary artery disease and aortic atherosclerosis. She has known history of essential hypertension, hyperlipidemia, sleep apnea and GERD. She had CT chest done in September to evaluate cough.  It showed coronary artery calcifications and aortic atherosclerosis.  The study was personally reviewed by me and showed moderate calcifications overall.  Abdominal ultrasound 2021 showed no evidence of aortic aneurysm. She was hospitalized in October 2023 for atypical chest pain.  Troponin normal.  Echocardiogram showed normal LV systolic function with mild mitral and tricuspid regurgitation. She reports mild memory decline and she has been placed on small dose Aricept.  She was having intermittent chest pain in the setting of recurrent cough.  Subsequently her symptoms improved.  She does complain of dyspnea with minimal exertion and has occasional episodes of substernal chest tightness.  She denies lower extremity claudication.   Past Medical History:  Diagnosis Date   Actinic keratosis 03/15/2021   L post thigh    Basal cell carcinoma 06/21/2021   L alar crease, EDC done 07/31/21   GERD (gastroesophageal reflux disease)    Hyperlipidemia    Hypertension    Malignant essential hypertension 02/26/2017   Plantar fasciitis    Sleep apnea    Urinary tract infection     Past Surgical History:  Procedure Laterality Date   ABDOMINAL HYSTERECTOMY     COLONOSCOPY WITH PROPOFOL N/A 12/12/2015   Procedure: COLONOSCOPY WITH PROPOFOL;  Surgeon: Scot Jun, MD;  Location: [facility] ENDOSCOPY;   Service: Endoscopy;  Laterality: N/A;   HAND SURGERY  2005     Current Outpatient Medications  Medication Sig Dispense Refill   amLODipine (NORVASC) 10 MG tablet TAKE 1 TABLET BY MOUTH EVERY DAY 90 tablet 1   aspirin EC 81 MG tablet Take 81 mg by mouth daily.     donepezil (ARICEPT) 5 MG tablet Take 1 tablet by mouth at bedtime.     gabapentin (NEURONTIN) 100 MG capsule Take 100 mg by mouth at bedtime.     hydrALAZINE (APRESOLINE) 10 MG tablet TAKE 1 TABLET BY MOUTH THREE TIMES A DAY 270 tablet 1   hydrochlorothiazide (HYDRODIURIL) 12.5 MG tablet TAKE 1 TABLET BY MOUTH EVERY DAY 90 tablet 3   Multiple Vitamins-Minerals (HAIR SKIN & NAILS PO) Take by mouth.     omeprazole (PRILOSEC) 40 MG capsule TAKE 1 CAPSULE (40 MG TOTAL) BY MOUTH DAILY. 90 capsule 1   OVER THE COUNTER MEDICATION Doterra-EO Mega/AlphaCRS/MIcro Plex VM/Deep Blue Veg capsules     rosuvastatin (CRESTOR) 5 MG tablet TAKE 1 TABLET (5 MG TOTAL) BY MOUTH DAILY. 90 tablet 3   No current facility-administered medications for this visit.    Allergies:   Iodinated contrast media and Iodine    Social History:  The patient  reports that she has never smoked. She has never used smokeless tobacco. She reports current alcohol use. She reports that she does not use drugs.   Family History:  The patient's family history includes Breast cancer in her cousin; Cancer in her paternal grandfather;  Heart disease in her paternal grandmother; Stroke in her brother, mother, and sister.    ROS:  Please see the history of present illness.   Otherwise, review of systems are positive for none.   All other systems are reviewed and negative.    PHYSICAL EXAM: VS:  BP (!) 140/62 (BP Location: Right Arm, Patient Position: Sitting, Cuff Size: Normal)   Pulse 76   Ht 4' 8.5" (1.435 m)   Wt 149 lb 8 oz (67.8 kg)   SpO2 98%   BMI 32.93 kg/m  , BMI Body mass index is 32.93 kg/m. GEN: Well nourished, well developed, in no acute distress  HEENT:  normal  Neck: no JVD, carotid bruits, or masses Cardiac: RRR; no  rubs, or gallops,no edema .  2 out of 6 systolic murmur in the aortic area Respiratory:  clear to auscultation bilaterally, normal work of breathing GI: soft, nontender, nondistended, + BS MS: no deformity or atrophy  Skin: warm and dry, no rash Neuro:  Strength and sensation are intact Psych: euthymic mood, full affect Dorsalis pedis and posterior tibialis +2 bilaterally.   EKG:  EKG is ordered today. The ekg ordered today demonstrates : Normal sinus rhythm Left axis deviation Minimal voltage criteria for LVH, may be normal variant ( R in aVL ) Anterolateral infarct (cited on or before 16-Jan-2023) When compared with ECG of 16-Jan-2023 11:24, Questionable change in initial forces of Lateral leads      Recent Labs: 01/21/2023: Hemoglobin 14.4; Platelets 239 06/13/2023: ALT 29; BUN 9; Creatinine, Ser 0.64; Potassium 3.8; Sodium 141    Lipid Panel    Component Value Date/Time   CHOL 176 10/20/2022 0820   CHOL 262 (H) 09/11/2021 0959   TRIG 87 10/20/2022 0820   HDL 50 10/20/2022 0820   HDL 55 09/11/2021 0959   CHOLHDL 3.5 10/20/2022 0820   VLDL 17 10/20/2022 0820   LDLCALC 109 (H) 10/20/2022 0820   LDLCALC 189 (H) 09/11/2021 0959      Wt Readings from Last 3 Encounters:  12/19/23 149 lb 8 oz (67.8 kg)  11/29/23 150 lb (68 kg)  11/11/23 150 lb 12.8 oz (68.4 kg)          12/19/2023    3:02 PM  PAD Screen  Previous PAD dx? No  Previous surgical procedure? No  Pain with walking? Yes  Subsides with rest? Yes  Feet/toe relief with dangling? No  Painful, non-healing ulcers? No  Extremities discolored? No      ASSESSMENT AND PLAN:  1.  Coronary artery disease involving native coronary arteries with other forms of angina: Her CT scan showed at least moderate coronary artery calcifications especially in the LAD distribution.  Her symptoms include dyspnea with minimal exertion and intermittent  episodes of chest pain.  I recommend evaluation with a cardiac PET scan.  She is not able to exercise on a treadmill.  Her baseline EKG is abnormal with poor R wave progression in the anterior leads suggestive of prior anterior infarct.  Continue low-dose aspirin for now.  2.  Aortic atherosclerosis: She has no claudication and lower extremity pulses are normal.  3.  Essential hypertension: Blood pressure is reasonably controlled.  4.  Hyperlipidemia: She has known history of severe hyperlipidemia with previous LDL of 189.  This improved with small dose rosuvastatin but her LDL was still above 100.  I increased rosuvastatin to 20 mg once daily and will get lipid and liver profile in 2 months.    Disposition:  FU with me in 6 months  Signed,  Lorine Bears, MD  12/19/2023 3:13 PM    [facility]

## 2023-12-27 ENCOUNTER — Telehealth: Payer: Self-pay

## 2023-12-27 DIAGNOSIS — Z20822 Contact with and (suspected) exposure to covid-19: Secondary | ICD-10-CM | POA: Diagnosis not present

## 2023-12-27 DIAGNOSIS — U071 COVID-19: Secondary | ICD-10-CM | POA: Diagnosis not present

## 2023-12-27 NOTE — Telephone Encounter (Signed)
 Pt called that she still coughing and she had go home from due to coughing as per lauren advised her to go to urgent care

## 2024-01-10 ENCOUNTER — Telehealth: Payer: Self-pay | Admitting: Cardiovascular Disease

## 2024-01-10 NOTE — Telephone Encounter (Signed)
Pt's daughter is requesting a callback to go over how to prepare for your cardiac PET/CT. Please advise

## 2024-01-10 NOTE — Telephone Encounter (Signed)
The daughter has been made aware, per dpr.   How to Prepare for Your Cardiac PET/CT Stress Test:   Nothing to eat or drink, except water, 3 hours prior to arrival time.  NO caffeine/decaffeinated products, or chocolate 12 hours prior to arrival. (Please note decaffeinated beverages (teas/coffees) still contain caffeine).  If you have caffeine within 12 hours prior, the test will need to be rescheduled.   Medication instructions:                  -hold the hydrochlorothiazide the morning of the test   3.NO perfume, cologne or lotion on chest or abdomen area. FEMALES - Please avoid wearing dresses to this appointment.   4. Total time is 1 to 2 hours; you may want to bring reading material for the waiting time.

## 2024-01-13 ENCOUNTER — Ambulatory Visit: Payer: Medicare HMO | Admitting: Nurse Practitioner

## 2024-01-18 ENCOUNTER — Other Ambulatory Visit: Payer: Self-pay | Admitting: Physician Assistant

## 2024-01-18 DIAGNOSIS — I10 Essential (primary) hypertension: Secondary | ICD-10-CM

## 2024-01-21 ENCOUNTER — Encounter (HOSPITAL_COMMUNITY): Payer: Self-pay

## 2024-01-21 ENCOUNTER — Other Ambulatory Visit: Payer: Self-pay | Admitting: Physician Assistant

## 2024-01-21 DIAGNOSIS — K219 Gastro-esophageal reflux disease without esophagitis: Secondary | ICD-10-CM

## 2024-01-22 ENCOUNTER — Telehealth (HOSPITAL_COMMUNITY): Payer: Self-pay | Admitting: *Deleted

## 2024-01-22 NOTE — Telephone Encounter (Signed)
Reaching out to patient to offer assistance regarding upcoming cardiac imaging study; pt verbalizes understanding of appt date/time, parking situation and where to check in, pre-test NPO status and verified current allergies; name and call back number provided for further questions should they arise  Larey Brick RN Navigator Cardiac Imaging [facility] [phone] office [phone] cell  Patient aware to avoid caffeine 12 hours prior to her cardiac PET scan.

## 2024-01-23 ENCOUNTER — Ambulatory Visit
Admission: RE | Admit: 2024-01-23 | Discharge: 2024-01-23 | Disposition: A | Discharge status: Home / Self Care | Payer: Medicare HMO | Source: Ambulatory Visit | Attending: Cardiovascular Disease | Admitting: Cardiovascular Disease

## 2024-01-23 DIAGNOSIS — I2510 Atherosclerotic heart disease of native coronary artery without angina pectoris: Secondary | ICD-10-CM | POA: Insufficient documentation

## 2024-01-23 DIAGNOSIS — I700 Atherosclerosis of aorta: Secondary | ICD-10-CM | POA: Insufficient documentation

## 2024-01-23 DIAGNOSIS — R0609 Other forms of dyspnea: Secondary | ICD-10-CM

## 2024-01-23 MED ORDER — REGADENOSON 0.4 MG/5ML IV SOLN
0.4000 mg | Freq: Once | INTRAVENOUS | Status: AC
  Administered 2024-01-23: 0.4 mg via INTRAVENOUS
  Filled 2024-01-23: qty 5

## 2024-01-23 MED ORDER — REGADENOSON 0.4 MG/5ML IV SOLN
INTRAVENOUS | Status: AC
  Filled 2024-01-23: qty 5

## 2024-01-23 MED ORDER — RUBIDIUM RB82 GENERATOR (RUBYFILL)
25.0000 | PACK | Freq: Once | INTRAVENOUS | Status: AC
  Administered 2024-01-23: 17.44 via INTRAVENOUS

## 2024-01-23 MED ORDER — RUBIDIUM RB82 GENERATOR (RUBYFILL)
25.0000 | PACK | Freq: Once | INTRAVENOUS | Status: AC
  Administered 2024-01-23: 17.46 via INTRAVENOUS

## 2024-01-23 NOTE — Progress Notes (Signed)
Patient presents for a cardiac PET stress test and tolerated procedure without incident. Patient maintained acceptable vital signs throughout the test and was offered caffeine after test.  Patient ambulated out of department with a steady gait.

## 2024-01-24 LAB — NM PET CT CARDIAC PERFUSION MULTI W/ABSOLUTE BLOODFLOW
LV dias vol: 49 mL (ref 46–106)
MBFR: 3.4
Nuc Rest EF: 67 %
Nuc Stress EF: 73 %
Peak HR: 87 {beats}/min
Rest HR: 66 {beats}/min
Rest MBF: 0.75 ml/g/min
Rest Nuclear Isotope Dose: 17.5 mCi
Rest perfusion cavity size (mL): 56 mL
SRS: 1
SSS: 4
ST Depression (mm): 0 mm
Stress MBF: 2.55 ml/g/min
Stress Nuclear Isotope Dose: 17.4 mCi
Stress perfusion cavity size (mL): 49 mL
TID: 1.09

## 2024-01-27 ENCOUNTER — Inpatient Hospital Stay: Payer: Medicare HMO | Attending: Oncology

## 2024-01-27 ENCOUNTER — Telehealth: Payer: Self-pay | Admitting: *Deleted

## 2024-01-27 ENCOUNTER — Inpatient Hospital Stay: Payer: Medicare HMO

## 2024-01-27 DIAGNOSIS — N289 Disorder of kidney and ureter, unspecified: Secondary | ICD-10-CM | POA: Insufficient documentation

## 2024-01-27 DIAGNOSIS — K760 Fatty (change of) liver, not elsewhere classified: Secondary | ICD-10-CM | POA: Diagnosis not present

## 2024-01-27 DIAGNOSIS — E876 Hypokalemia: Secondary | ICD-10-CM | POA: Insufficient documentation

## 2024-01-27 DIAGNOSIS — Z803 Family history of malignant neoplasm of breast: Secondary | ICD-10-CM | POA: Diagnosis not present

## 2024-01-27 DIAGNOSIS — Z79899 Other long term (current) drug therapy: Secondary | ICD-10-CM | POA: Insufficient documentation

## 2024-01-27 LAB — CBC WITH DIFFERENTIAL/PLATELET
Abs Immature Granulocytes: 0.04 10*3/uL (ref 0.00–0.07)
Basophils Absolute: 0.1 10*3/uL (ref 0.0–0.1)
Basophils Relative: 1 %
Eosinophils Absolute: 0.5 10*3/uL (ref 0.0–0.5)
Eosinophils Relative: 5 %
HCT: 43.4 % (ref 36.0–46.0)
Hemoglobin: 15 g/dL (ref 12.0–15.0)
Immature Granulocytes: 0 %
Lymphocytes Relative: 27 %
Lymphs Abs: 2.7 10*3/uL (ref 0.7–4.0)
MCH: 31 pg (ref 26.0–34.0)
MCHC: 34.6 g/dL (ref 30.0–36.0)
MCV: 89.7 fL (ref 80.0–100.0)
Monocytes Absolute: 1.1 10*3/uL — ABNORMAL HIGH (ref 0.1–1.0)
Monocytes Relative: 11 %
Neutro Abs: 5.6 10*3/uL (ref 1.7–7.7)
Neutrophils Relative %: 56 %
Platelets: 275 10*3/uL (ref 150–400)
RBC: 4.84 MIL/uL (ref 3.87–5.11)
RDW: 12.6 % (ref 11.5–15.5)
WBC: 9.9 10*3/uL (ref 4.0–10.5)
nRBC: 0 % (ref 0.0–0.2)

## 2024-01-27 LAB — COMPREHENSIVE METABOLIC PANEL
ALT: 39 U/L (ref 0–44)
AST: 44 U/L — ABNORMAL HIGH (ref 15–41)
Albumin: 4.6 g/dL (ref 3.5–5.0)
Alkaline Phosphatase: 77 U/L (ref 38–126)
Anion gap: 6 (ref 5–15)
BUN: 10 mg/dL (ref 8–23)
CO2: 26 mmol/L (ref 22–32)
Calcium: 10 mg/dL (ref 8.9–10.3)
Chloride: 103 mmol/L (ref 98–111)
Creatinine, Ser: 0.7 mg/dL (ref 0.44–1.00)
GFR, Estimated: >60 mL/min (ref >60)
Glucose, Bld: 83 mg/dL (ref 70–99)
Potassium: 2.9 mmol/L — ABNORMAL LOW (ref 3.5–5.1)
Sodium: 135 mmol/L (ref 135–145)
Total Bilirubin: 0.7 mg/dL (ref 0.0–1.2)
Total Protein: 8.1 g/dL (ref 6.5–8.1)

## 2024-01-27 LAB — IRON AND TIBC
Iron: 103 ug/dL (ref 28–170)
Saturation Ratios: 26 % (ref 10.4–31.8)
TIBC: 396 ug/dL (ref 250–450)
UIBC: 293 ug/dL

## 2024-01-27 LAB — FERRITIN: Ferritin: 108 ng/mL (ref 11–307)

## 2024-01-27 NOTE — Telephone Encounter (Signed)
Patient called while she is in an appointment with her husband and she felt her labs were today but was later and she wanted to see if she can come on over because she be there in 5 minutes.  I called the lab and they said it is fine to do it and I asked Morrie Sheldon to change the appointment to earlier this morning.

## 2024-01-27 NOTE — Telephone Encounter (Signed)
Patient called back saying that she wanted to know what her labs are.   I told her that everybody has gone today and she will be looking at the labs and if she needs you to get any IV iron then they will let you know.  She is okay with that

## 2024-01-28 ENCOUNTER — Other Ambulatory Visit: Payer: Self-pay | Admitting: Oncology

## 2024-01-28 MED ORDER — POTASSIUM CHLORIDE CRYS ER 20 MEQ PO TBCR
20.0000 meq | EXTENDED_RELEASE_TABLET | Freq: Two times a day (BID) | ORAL | 0 refills | Status: DC
Start: 2024-01-28 — End: 2024-06-12

## 2024-01-28 NOTE — Telephone Encounter (Signed)
Spoke to pt and informed her that Dr. Cathie Hoops has sent in oral potassium to pharmacy- to take daily for 3 days. Also informed her that she will not need IV iron or phleb on Monday.   Please cancel phleb on 2/10. Keep MD app.

## 2024-02-02 NOTE — Assessment & Plan Note (Addendum)
#  Compound heterozygous hemochromatosis mutation Patient carries 2 mutations CY282Y and H63D Labs reviewed and discussed with patient. Lab Results  Component Value Date   HGB 15.0 01/27/2024   TIBC 396 01/27/2024   IRONPCTSAT 26 01/27/2024   FERRITIN 108 01/27/2024     Iron labs remain stable, ferritin level 108 Normal liver function.  No need for phlebotomy at this point for isolated increased iron saturation Continue expectant management. Avoid iron supplementation, alcohol use, vitamin C supplementation.

## 2024-02-03 ENCOUNTER — Inpatient Hospital Stay: Payer: Medicare HMO

## 2024-02-03 ENCOUNTER — Encounter: Payer: Self-pay | Admitting: Oncology

## 2024-02-03 ENCOUNTER — Inpatient Hospital Stay (HOSPITAL_BASED_OUTPATIENT_CLINIC_OR_DEPARTMENT_OTHER): Payer: Medicare HMO | Admitting: Oncology

## 2024-02-03 DIAGNOSIS — E876 Hypokalemia: Secondary | ICD-10-CM | POA: Diagnosis not present

## 2024-02-03 DIAGNOSIS — N2889 Other specified disorders of kidney and ureter: Secondary | ICD-10-CM

## 2024-02-03 DIAGNOSIS — K760 Fatty (change of) liver, not elsewhere classified: Secondary | ICD-10-CM

## 2024-02-03 DIAGNOSIS — N289 Disorder of kidney and ureter, unspecified: Secondary | ICD-10-CM | POA: Diagnosis not present

## 2024-02-03 DIAGNOSIS — Z79899 Other long term (current) drug therapy: Secondary | ICD-10-CM | POA: Diagnosis not present

## 2024-02-03 DIAGNOSIS — Z803 Family history of malignant neoplasm of breast: Secondary | ICD-10-CM | POA: Diagnosis not present

## 2024-02-03 LAB — BASIC METABOLIC PANEL - CANCER CENTER ONLY
Anion gap: 9 (ref 5–15)
BUN: 10 mg/dL (ref 8–23)
CO2: 28 mmol/L (ref 22–32)
Calcium: 10.2 mg/dL (ref 8.9–10.3)
Chloride: 99 mmol/L (ref 98–111)
Creatinine: 0.68 mg/dL (ref 0.44–1.00)
GFR, Estimated: >60 mL/min (ref >60)
Glucose, Bld: 97 mg/dL (ref 70–99)
Potassium: 3.5 mmol/L (ref 3.5–5.1)
Sodium: 136 mmol/L (ref 135–145)

## 2024-02-03 NOTE — Progress Notes (Signed)
 Hematology/Oncology Progress note [phone] [phone]     CHIEF COMPLAINTS/REASON FOR VISIT:  Follow-up for compound heterozygous hemochromatosis  ASSESSMENT & PLAN:   Hemochromatosis #Compound heterozygous hemochromatosis mutation Patient carries 2 mutations CY282Y and H63D Labs reviewed and discussed with patient. Lab Results  Component Value Date   HGB 15.0 01/27/2024   TIBC 396 01/27/2024   IRONPCTSAT 26 01/27/2024   FERRITIN 108 01/27/2024     Iron labs remain stable, ferritin level 108 Normal liver function.  No need for phlebotomy at this point for isolated increased iron saturation Continue expectant management. Avoid iron supplementation, alcohol use, vitamin C supplementation.  Fatty liver disease, nonalcoholic Recommend life style modification.   Hypokalemia She took potassium chloride  20meq x 3 days. Repeat BMP today showed normal potassium.   Renal mass, right Currently on surveillance, follow up with urology  Orders Placed This Encounter  Procedures   Basic Metabolic Panel - Cancer Center Only    Standing Status:   Future    Number of Occurrences:   1    Expected Date:   02/03/2024    Expiration Date:   02/02/2025   CBC with Differential (Cancer Center Only)    Standing Status:   Future    Expected Date:   08/02/2024    Expiration Date:   02/02/2025   Iron and TIBC    Standing Status:   Future    Expected Date:   08/02/2024    Expiration Date:   02/02/2025   Ferritin    Standing Status:   Future    Expected Date:   08/02/2024    Expiration Date:   02/02/2025   Hepatic function panel    Standing Status:   Future    Expected Date:   08/02/2024    Expiration Date:   02/02/2025   Follow up in 6 months All questions were answered. The patient knows to call the clinic with any problems, questions or concerns.  Timmy Forbes, MD, PhD [facility] Hematology Oncology 02/03/2024   PERTINENT HEMATOLOGY HISTORY  07/05/2020, patient had  blood work done which showed decreased iron saturation 14, ferritin level was increased at 356, TIBC 315.  Patient was referred to hematology for further evaluation for possible hemochromatosis/elevated ferritin. Patient reports feeling well at baseline today. Patient drinks red wine 4-5 days per week.  Occasionally she also drinks beer when she orders pizza. Denies any recent infection, new medication. Denies any family history of hemochromatosis.   INTERVAL HISTORY Cynthia Keith is a 78 y.o. female who has above history reviewed by me today presents for follow up visit for compound heterozygous hemochromatosis mutation Today patient reports feeling well. No new complaints  Review of Systems  Constitutional:  Negative for appetite change, chills, fatigue and fever.  HENT:   Negative for hearing loss and voice change.   Eyes:  Negative for eye problems.  Respiratory:  Negative for chest tightness and cough.   Cardiovascular:  Negative for chest pain and leg swelling.  Gastrointestinal:  Negative for abdominal distention and blood in stool.  Endocrine: Negative for hot flashes.  Genitourinary:  Negative for difficulty urinating and frequency.   Musculoskeletal:  Negative for arthralgias.  Skin:  Negative for itching and rash.  Neurological:  Negative for extremity weakness.  Hematological:  Negative for adenopathy.  Psychiatric/Behavioral:  Negative for confusion.     MEDICAL HISTORY:  Past Medical History:  Diagnosis Date   Actinic keratosis 03/15/2021   L post thigh  Basal cell carcinoma 06/21/2021   L alar crease, EDC done 07/31/21   GERD (gastroesophageal reflux disease)    Hyperlipidemia    Hypertension    Malignant essential hypertension 02/26/2017   Plantar fasciitis    Sleep apnea    Urinary tract infection     SURGICAL HISTORY: Past Surgical History:  Procedure Laterality Date   ABDOMINAL HYSTERECTOMY     COLONOSCOPY WITH PROPOFOL  N/A 12/12/2015   Procedure:  COLONOSCOPY WITH PROPOFOL ;  Surgeon: Cassie Click, MD;  Location: [facility] ENDOSCOPY;  Service: Endoscopy;  Laterality: N/A;   HAND SURGERY  2005    SOCIAL HISTORY: Social History   Socioeconomic History   Marital status: Married    Spouse name: Not on file   Number of children: Not on file   Years of education: Not on file   Highest education level: Not on file  Occupational History   Not on file  Tobacco Use   Smoking status: Never   Smokeless tobacco: Never  Vaping Use   Vaping status: Never Used  Substance and Sexual Activity   Alcohol use: Yes    Comment: ocassionally   Drug use: No   Sexual activity: Yes    Birth control/protection: Post-menopausal, Surgical  Other Topics Concern   Not on file  Social History Narrative   Not on file   Social Drivers of Health   Financial Resource Strain: Not on file  Food Insecurity: No Food Insecurity (10/20/2022)   Hunger Vital Sign    Worried About Running Out of Food in the Last Year: Never true    Ran Out of Food in the Last Year: Never true  Transportation Needs: No Transportation Needs (10/20/2022)   PRAPARE - Administrator, Civil Service (Medical): No    Lack of Transportation (Non-Medical): No  Physical Activity: Not on file  Stress: Not on file  Social Connections: Not on file  Intimate Partner Violence: Not At Risk (10/20/2022)   Humiliation, Afraid, Rape, and Kick questionnaire    Fear of Current or Ex-Partner: No    Emotionally Abused: No    Physically Abused: No    Sexually Abused: No    FAMILY HISTORY: Family History  Problem Relation Age of Onset   Breast cancer Cousin    Stroke Mother    Heart disease Paternal Grandmother    Cancer Paternal Grandfather    Stroke Sister    Stroke Brother     ALLERGIES:  is allergic to iodinated contrast media and iodine.  MEDICATIONS:  Current Outpatient Medications  Medication Sig Dispense Refill   amLODipine  (NORVASC ) 10 MG tablet TAKE 1 TABLET  BY MOUTH EVERY DAY 90 tablet 1   aspirin  EC 81 MG tablet Take 81 mg by mouth daily.     donepezil (ARICEPT) 5 MG tablet Take 1 tablet by mouth at bedtime.     gabapentin  (NEURONTIN ) 100 MG capsule Take 100 mg by mouth at bedtime.     hydrALAZINE  (APRESOLINE ) 10 MG tablet TAKE 1 TABLET BY MOUTH THREE TIMES A DAY 270 tablet 1   hydrochlorothiazide  (HYDRODIURIL ) 12.5 MG tablet TAKE 1 TABLET BY MOUTH EVERY DAY 90 tablet 3   Multiple Vitamins-Minerals (HAIR SKIN & NAILS PO) Take by mouth.     omeprazole  (PRILOSEC) 40 MG capsule TAKE 1 CAPSULE (40 MG TOTAL) BY MOUTH DAILY. 90 capsule 1   OVER THE COUNTER MEDICATION Doterra-EO Mega/AlphaCRS/MIcro Plex VM/Deep Blue Veg capsules     potassium chloride  SA (KLOR-CON  M)  20 MEQ tablet Take 1 tablet (20 mEq total) by mouth 2 (two) times daily. 3 tablet 0   rosuvastatin  (CRESTOR ) 20 MG tablet Take 1 tablet (20 mg total) by mouth daily. 90 tablet 1   No current facility-administered medications for this visit.     PHYSICAL EXAMINATION: ECOG PERFORMANCE STATUS: 1 - Symptomatic but completely ambulatory Vitals:   02/03/24 1305  BP: (!) 155/64  Pulse: 64  Resp: 18  Temp: (!) 97.1 F (36.2 C)  SpO2: 98%   Filed Weights   02/03/24 1305  Weight: 145 lb 6.4 oz (66 kg)    Physical Exam Constitutional:      General: She is not in acute distress. HENT:     Head: Normocephalic and atraumatic.  Eyes:     General: No scleral icterus. Cardiovascular:     Rate and Rhythm: Normal rate and regular rhythm.     Heart sounds: Normal heart sounds.  Pulmonary:     Effort: Pulmonary effort is normal. No respiratory distress.     Breath sounds: No wheezing.  Abdominal:     General: Bowel sounds are normal. There is no distension.     Palpations: Abdomen is soft.  Musculoskeletal:        General: No deformity. Normal range of motion.     Cervical back: Normal range of motion and neck supple.  Skin:    General: Skin is warm and dry.     Findings: No  erythema or rash.  Neurological:     Mental Status: She is alert and oriented to person, place, and time. Mental status is at baseline.     Cranial Nerves: No cranial nerve deficit.     Coordination: Coordination normal.  Psychiatric:        Mood and Affect: Mood normal.     LABORATORY DATA:  I have reviewed the data as listed     Latest Ref Rng & Units 01/27/2024   10:32 AM 01/21/2023    9:38 AM 01/16/2023   11:37 AM  CBC  WBC 4.0 - 10.5 K/uL 9.9  7.1  7.7   Hemoglobin 12.0 - 15.0 g/dL 16.1  09.6  04.5   Hematocrit 36.0 - 46.0 % 43.4  42.4  40.1   Platelets 150 - 400 K/uL 275  239  252     Lab Results  Component Value Date   IRON 103 01/27/2024   TIBC 396 01/27/2024   FERRITIN 108 01/27/2024     RADIOGRAPHIC STUDIES: I have personally reviewed the radiological images as listed and agreed with the findings in the report. NM PET CT CARDIAC PERFUSION MULTI W/ABSOLUTE BLOODFLOW Result Date: 01/24/2024   LV perfusion is normal. There is no evidence of ischemia. There is no evidence of infarction.   Rest left ventricular function is normal. Rest EF: 67%. Stress left ventricular function is normal. Stress EF: 73%. End diastolic cavity size is normal.   Myocardial blood flow was computed to be 0.75ml/g/min at rest and 2.55ml/g/min at stress. Global myocardial blood flow reserve was 3.40 and was normal.   Coronary calcium  was present on the attenuation correction CT images. Moderate coronary calcifications were present. Coronary calcifications were present in the left anterior descending artery and right coronary artery distribution(s).   The study is normal. The study is low risk.   Electronically signed by Jackquelyn Mass, MD CLINICAL DATA:  This over-read does not include interpretation of cardiac or coronary anatomy or pathology. The Cardiac PET CT interpretation by  the cardiologist is attached. COMPARISON:  CT chest, 09/16/2023, MR abdomen, 08/12/2023 FINDINGS: Cardiovascular: Aortic  atherosclerosis. Normal heart size. Left and right coronary artery calcifications. No pericardial effusion. Limited Mediastinum/Nodes: No enlarged mediastinal, hilar, or axillary lymph nodes. Trachea and esophagus demonstrate no significant findings. Limited Lungs/Pleura: Lungs are clear. No pleural effusion or pneumothorax. Upper Abdomen: No acute abnormality. Partially imaged right renal mass (series 3, image 77). Musculoskeletal: No chest wall abnormality. No acute osseous findings. IMPRESSION: 1. No acute findings in the imaged portions of the chest. 2. Partially imaged right renal mass, previously characterized by MR and consistent with renal cell carcinoma. 3. No evidence of lymphadenopathy or metastatic disease in the included chest. 4. Coronary artery disease. Aortic Atherosclerosis (ICD10-I70.0). Electronically Signed   By: Fredricka Jenny M.D.   On: 01/23/2024 11:20

## 2024-02-03 NOTE — Assessment & Plan Note (Signed)
Recommend life style modification.

## 2024-02-03 NOTE — Assessment & Plan Note (Signed)
 Currently on surveillance, follow up with urology

## 2024-02-03 NOTE — Assessment & Plan Note (Signed)
 She took potassium chloride  x 3 days. Repeat BMP today showed normal potassium.

## 2024-02-17 ENCOUNTER — Telehealth: Payer: Self-pay | Admitting: Cardiovascular Disease

## 2024-02-17 NOTE — Telephone Encounter (Signed)
 Attempted to contact daughter. LVM. Left call back number.

## 2024-02-17 NOTE — Telephone Encounter (Signed)
 Patient's daughter is requesting to speak with a nurse in regard to the PET CT Cardiac scan. Patient's daughter is requesting to be the main contact for the patient if we need to reach out due to the patient having memory troubles. Please advise.

## 2024-02-18 NOTE — Telephone Encounter (Signed)
 Spoke with patient daughter.   Advised of normal PET stress test.   Advised per AVS in December- recommended to have lab work in 2 months (lipid, lft) this was coming due, advised okay to come anytime to be completed.   Patient daughter verbalized understanding, thankful for call back.

## 2024-02-25 LAB — LIPID PANEL
Chol/HDL Ratio: 2.5 ratio (ref 0.0–4.4)
Cholesterol, Total: 147 mg/dL (ref 100–199)
HDL: 59 mg/dL (ref >39)
LDL Chol Calc (NIH): 69 mg/dL (ref 0–99)
Triglycerides: 108 mg/dL (ref 0–149)
VLDL Cholesterol Cal: 19 mg/dL (ref 5–40)

## 2024-02-25 LAB — HEPATIC FUNCTION PANEL
ALT: 27 IU/L (ref 0–32)
AST: 35 IU/L (ref 0–40)
Albumin: 4.4 g/dL (ref 3.8–4.8)
Alkaline Phosphatase: 90 IU/L (ref 44–121)
Bilirubin Total: 0.4 mg/dL (ref 0.0–1.2)
Bilirubin, Direct: 0.15 mg/dL (ref 0.00–0.40)
Total Protein: 7.2 g/dL (ref 6.0–8.5)

## 2024-03-02 ENCOUNTER — Encounter: Payer: Self-pay | Admitting: *Deleted

## 2024-03-09 ENCOUNTER — Encounter: Payer: Self-pay | Admitting: Nurse Practitioner

## 2024-03-09 ENCOUNTER — Ambulatory Visit: Payer: Medicare HMO | Admitting: Nurse Practitioner

## 2024-03-09 VITALS — BP 146/70 | HR 61 | Temp 98.2°F | Resp 16 | Ht <= 58 in | Wt 142.6 lb

## 2024-03-09 DIAGNOSIS — K219 Gastro-esophageal reflux disease without esophagitis: Secondary | ICD-10-CM

## 2024-03-09 DIAGNOSIS — E876 Hypokalemia: Secondary | ICD-10-CM

## 2024-03-09 DIAGNOSIS — R053 Chronic cough: Secondary | ICD-10-CM | POA: Diagnosis not present

## 2024-03-09 NOTE — Progress Notes (Signed)
 [facility] [address] [place], [place] [address]  Internal MEDICINE  Office Visit Note  Patient Name: Cynthia Keith  [id number]  [id number]  Date of Service: 03/09/2024  Chief Complaint  Patient presents with   Follow-up    HPI Cynthia Keith presents for a follow-up visit for chronic cough Chronic cough has resolved GERD -- well controlled.  Low potassium -- needs lab rechecked, has PCP visit next week.     Current Medication: Outpatient Encounter Medications as of 03/09/2024  Medication Sig   amLODipine  (NORVASC ) 10 MG tablet TAKE 1 TABLET BY MOUTH EVERY DAY   aspirin  EC 81 MG tablet Take 81 mg by mouth daily.   donepezil (ARICEPT) 5 MG tablet Take 1 tablet by mouth at bedtime.   gabapentin  (NEURONTIN ) 100 MG capsule Take 100 mg by mouth at bedtime.   hydrochlorothiazide  (HYDRODIURIL ) 12.5 MG tablet TAKE 1 TABLET BY MOUTH EVERY DAY   Multiple Vitamins-Minerals (HAIR SKIN & NAILS PO) Take by mouth.   omeprazole  (PRILOSEC) 40 MG capsule TAKE 1 CAPSULE (40 MG TOTAL) BY MOUTH DAILY.   OVER THE COUNTER MEDICATION Doterra-EO Mega/AlphaCRS/MIcro Plex VM/Deep Blue Veg capsules   potassium chloride  SA (KLOR-CON  M) 20 MEQ tablet Take 1 tablet (20 mEq total) by mouth 2 (two) times daily.   rosuvastatin  (CRESTOR ) 20 MG tablet Take 1 tablet (20 mg total) by mouth daily.   [DISCONTINUED] hydrALAZINE  (APRESOLINE ) 10 MG tablet TAKE 1 TABLET BY MOUTH THREE TIMES A DAY   No facility-administered encounter medications on file as of 03/09/2024.    Surgical History: Past Surgical History:  Procedure Laterality Date   ABDOMINAL HYSTERECTOMY     COLONOSCOPY WITH PROPOFOL  N/A 12/12/2015   Procedure: COLONOSCOPY WITH PROPOFOL ;  Surgeon: Cassie Click, MD;  Location: [facility] ENDOSCOPY;  Service: Endoscopy;  Laterality: N/A;   HAND SURGERY  2005    Medical History: Past Medical History:  Diagnosis Date   Actinic keratosis 03/15/2021   L post thigh    Basal cell carcinoma  06/21/2021   L alar crease, EDC done 07/31/21   GERD (gastroesophageal reflux disease)    Hyperlipidemia    Hypertension    Malignant essential hypertension 02/26/2017   Plantar fasciitis    Sleep apnea    Urinary tract infection     Family History: Family History  Problem Relation Age of Onset   Breast cancer Cousin    Stroke Mother    Heart disease Paternal Grandmother    Cancer Paternal Grandfather    Stroke Sister    Stroke Brother     Social History   Socioeconomic History   Marital status: Married    Spouse name: Not on file   Number of children: Not on file   Years of education: Not on file   Highest education level: Not on file  Occupational History   Not on file  Tobacco Use   Smoking status: Never   Smokeless tobacco: Never  Vaping Use   Vaping status: Never Used  Substance and Sexual Activity   Alcohol use: Not Currently    Comment: ocassionally   Drug use: No   Sexual activity: Yes    Birth control/protection: Post-menopausal, Surgical  Other Topics Concern   Not on file  Social History Narrative   Not on file   Social Drivers of Health   Financial Resource Strain: Not on file  Food Insecurity: No Food Insecurity (10/20/2022)   Hunger Vital Sign    Worried About Running Out  of Food in the Last Year: Never true    Ran Out of Food in the Last Year: Never true  Transportation Needs: No Transportation Needs (10/20/2022)   PRAPARE - Administrator, Civil Service (Medical): No    Lack of Transportation (Non-Medical): No  Physical Activity: Not on file  Stress: Not on file  Social Connections: Not on file  Intimate Partner Violence: Not At Risk (10/20/2022)   Humiliation, Afraid, Rape, and Kick questionnaire    Fear of Current or Ex-Partner: No    Emotionally Abused: No    Physically Abused: No    Sexually Abused: No      Review of Systems  Constitutional: Negative.   HENT: Negative.    Respiratory: Negative.  Negative for cough,  chest tightness, shortness of breath and wheezing.   Cardiovascular: Negative.  Negative for chest pain and palpitations.  Gastrointestinal: Negative.   Neurological: Negative.   Psychiatric/Behavioral: Negative.      Vital Signs: BP (!) 146/70   Pulse 61   Temp 98.2 F (36.8 C)   Resp 16   Ht 4' 8.5" (1.435 m)   Wt 142 lb 9.6 oz (64.7 kg)   SpO2 96%   BMI 31.41 kg/m    Physical Exam Vitals reviewed.  Constitutional:      Appearance: Normal appearance. She is obese.  Neurological:     Mental Status: She is alert.        Assessment/Plan: 1. Chronic cough (Primary) Follow up as needed   2. Gastroesophageal reflux disease without esophagitis Controlled, follow up with PCP.   3. Hypokalemia Repeat labs ordered  - Basic Metabolic Panel (BMET)   General Counseling: hilal granda understanding of the findings of todays visit and agrees with plan of treatment. I have discussed any further diagnostic evaluation that may be needed or ordered today. We also reviewed her medications today. she has been encouraged to call the office with any questions or concerns that should arise related to todays visit.    Orders Placed This Encounter  Procedures   Basic Metabolic Panel (BMET)    No orders of the defined types were placed in this encounter.   Return if symptoms worsen or fail to improve, for F/U, pulmonary only as needed. .   Total time spent:30 Minutes Time spent includes review of chart, medications, test results, and follow up plan with the patient.   [place] Controlled Substance Database was reviewed by me.  This patient was seen by Laurence Pons, FNP-C in collaboration with Dr. Verneta Gone as a part of collaborative care agreement.   Rilley Poulter R. Bobbi Burow, MSN, FNP-C Internal medicine

## 2024-03-10 LAB — BASIC METABOLIC PANEL
BUN/Creatinine Ratio: 15 (ref 12–28)
BUN: 10 mg/dL (ref 8–27)
CO2: 24 mmol/L (ref 20–29)
Calcium: 10.9 mg/dL — ABNORMAL HIGH (ref 8.7–10.3)
Chloride: 101 mmol/L (ref 96–106)
Creatinine, Ser: 0.68 mg/dL (ref 0.57–1.00)
Glucose: 94 mg/dL (ref 70–99)
Potassium: 3.9 mmol/L (ref 3.5–5.2)
Sodium: 142 mmol/L (ref 134–144)
eGFR: 90 mL/min/{1.73_m2} (ref >59)

## 2024-03-16 ENCOUNTER — Ambulatory Visit (INDEPENDENT_AMBULATORY_CARE_PROVIDER_SITE_OTHER): Payer: Medicare HMO | Admitting: Physician Assistant

## 2024-03-16 ENCOUNTER — Encounter: Payer: Self-pay | Admitting: Physician Assistant

## 2024-03-16 VITALS — BP 120/70 | HR 66 | Temp 97.8°F | Resp 16 | Ht <= 58 in | Wt 142.0 lb

## 2024-03-16 DIAGNOSIS — R7989 Other specified abnormal findings of blood chemistry: Secondary | ICD-10-CM

## 2024-03-16 DIAGNOSIS — Z1329 Encounter for screening for other suspected endocrine disorder: Secondary | ICD-10-CM | POA: Diagnosis not present

## 2024-03-16 DIAGNOSIS — E538 Deficiency of other specified B group vitamins: Secondary | ICD-10-CM

## 2024-03-16 DIAGNOSIS — I10 Essential (primary) hypertension: Secondary | ICD-10-CM

## 2024-03-16 DIAGNOSIS — R413 Other amnesia: Secondary | ICD-10-CM

## 2024-03-16 DIAGNOSIS — E559 Vitamin D deficiency, unspecified: Secondary | ICD-10-CM

## 2024-03-16 NOTE — Progress Notes (Signed)
 [facility] [address] [place], [place] [address]  Internal MEDICINE  Office Visit Note  Patient Name: Cynthia Keith  [id number]  [id number]  Date of Service: 03/16/2024  Chief Complaint  Patient presents with   Follow-up   Gastroesophageal Reflux   Hypertension   Hyperlipidemia    HPI Pt is here for routine follow up -BP is stable -pt recognizing she is more forgetful, seeing neurology for this. Husband states it seems to be worsening despite the medication. Advised to call neurology for follow up. -taking her medications as prescribed -labs--potassium normal, but calcium high again. Has been back to taking doterra vitamins twice a day again, will decrease to once per day as this contains calcium and is likely contributing. Will recheck labs in a few weeks to monitor calcium -more fatigued, hx of OSA, but not on treatment and declines further. Discussed this can have a significant impact on memory as well as fatigue and urged to reconsider treatment. States she will think about it -Still working 4 days per week and may also contribute to fatigue. -avoiding driving much now, only goes places close by and that are very familiar to her  Current Medication: Outpatient Encounter Medications as of 03/16/2024  Medication Sig   amLODipine (NORVASC) 10 MG tablet TAKE 1 TABLET BY MOUTH EVERY DAY   aspirin EC 81 MG tablet Take 81 mg by mouth daily.   donepezil (ARICEPT) 5 MG tablet Take 1 tablet by mouth at bedtime.   gabapentin (NEURONTIN) 100 MG capsule Take 100 mg by mouth at bedtime.   hydrALAZINE (APRESOLINE) 10 MG tablet TAKE 1 TABLET BY MOUTH THREE TIMES A DAY   hydrochlorothiazide (HYDRODIURIL) 12.5 MG tablet TAKE 1 TABLET BY MOUTH EVERY DAY   Multiple Vitamins-Minerals (HAIR SKIN & NAILS PO) Take by mouth.   omeprazole (PRILOSEC) 40 MG capsule TAKE 1 CAPSULE (40 MG TOTAL) BY MOUTH DAILY.   OVER THE COUNTER MEDICATION Doterra-EO Mega/AlphaCRS/MIcro Plex VM/Deep Blue  Veg capsules   potassium chloride SA (KLOR-CON M) 20 MEQ tablet Take 1 tablet (20 mEq total) by mouth 2 (two) times daily.   rosuvastatin (CRESTOR) 20 MG tablet Take 1 tablet (20 mg total) by mouth daily.   No facility-administered encounter medications on file as of 03/16/2024.    Surgical History: Past Surgical History:  Procedure Laterality Date   ABDOMINAL HYSTERECTOMY     COLONOSCOPY WITH PROPOFOL N/A 12/12/2015   Procedure: COLONOSCOPY WITH PROPOFOL;  Surgeon: Scot Jun, MD;  Location: [facility] ENDOSCOPY;  Service: Endoscopy;  Laterality: N/A;   HAND SURGERY  2005    Medical History: Past Medical History:  Diagnosis Date   Actinic keratosis 03/15/2021   L post thigh    Basal cell carcinoma 06/21/2021   L alar crease, EDC done 07/31/21   GERD (gastroesophageal reflux disease)    Hyperlipidemia    Hypertension    Malignant essential hypertension 02/26/2017   Plantar fasciitis    Sleep apnea    Urinary tract infection     Family History: Family History  Problem Relation Age of Onset   Breast cancer Cousin    Stroke Mother    Heart disease Paternal Grandmother    Cancer Paternal Grandfather    Stroke Sister    Stroke Brother     Social History   Socioeconomic History   Marital status: Married    Spouse name: Not on file   Number of children: Not on file   Years of education: Not  on file   Highest education level: Not on file  Occupational History   Not on file  Tobacco Use   Smoking status: Never   Smokeless tobacco: Never  Vaping Use   Vaping status: Never Used  Substance and Sexual Activity   Alcohol use: Not Currently    Comment: ocassionally   Drug use: No   Sexual activity: Yes    Birth control/protection: Post-menopausal, Surgical  Other Topics Concern   Not on file  Social History Narrative   Not on file   Social Drivers of Health   Financial Resource Strain: Not on file  Food Insecurity: No Food Insecurity (10/20/2022)   Hunger Vital  Sign    Worried About Running Out of Food in the Last Year: Never true    Ran Out of Food in the Last Year: Never true  Transportation Needs: No Transportation Needs (10/20/2022)   PRAPARE - Administrator, Civil Service (Medical): No    Lack of Transportation (Non-Medical): No  Physical Activity: Not on file  Stress: Not on file  Social Connections: Not on file  Intimate Partner Violence: Not At Risk (10/20/2022)   Humiliation, Afraid, Rape, and Kick questionnaire    Fear of Current or Ex-Partner: No    Emotionally Abused: No    Physically Abused: No    Sexually Abused: No      Review of Systems  Constitutional:  Positive for fatigue. Negative for chills and unexpected weight change.  HENT:  Negative for congestion, postnasal drip, rhinorrhea, sneezing and sore throat.   Eyes:  Negative for redness.  Respiratory:  Negative for chest tightness, shortness of breath and wheezing.   Cardiovascular:  Negative for chest pain and palpitations.  Gastrointestinal:  Negative for abdominal pain, constipation, diarrhea, nausea and vomiting.  Genitourinary:  Negative for dysuria and frequency.  Musculoskeletal:  Negative for arthralgias, joint swelling and neck pain.  Skin:  Negative for rash.  Neurological: Negative.  Negative for tremors and numbness.  Hematological:  Negative for adenopathy. Does not bruise/bleed easily.  Psychiatric/Behavioral:  Negative for behavioral problems (Depression), sleep disturbance and suicidal ideas. The patient is not nervous/anxious.        Memory loss    Vital Signs: BP 120/70   Pulse 66   Temp 97.8 F (36.6 C)   Resp 16   Ht 4' 8.5" (1.435 m)   Wt 142 lb (64.4 kg)   SpO2 98%   BMI 31.27 kg/m    Physical Exam Vitals and nursing note reviewed.  Constitutional:      Appearance: Normal appearance.  HENT:     Head: Normocephalic and atraumatic.  Eyes:     Extraocular Movements: Extraocular movements intact.  Cardiovascular:      Rate and Rhythm: Normal rate and regular rhythm.  Pulmonary:     Effort: Pulmonary effort is normal.     Breath sounds: Normal breath sounds.  Skin:    General: Skin is warm and dry.  Neurological:     Mental Status: She is alert.     Gait: Gait normal.     Comments: Pt repeats herself in office and requires repeat instructions  Psychiatric:        Mood and Affect: Mood normal.        Behavior: Behavior normal.        Assessment/Plan: 1. Essential hypertension (Primary) Well controlled, continue current medication  2. Memory loss Some worsening per pt and family, followed by neurology and will  notify them/clarify when next appt is. Did discuss correlation with untreated sleep apnea, but pt continues to decline cpap  3. High serum calcium Will decrease supplement containing calcium. Recheck labs in a few weeks - PTH, Intact and Calcium - Calcium, ionized  4. Thyroid disorder screening - TSH + free T4  5. B12 deficiency - B12 and Folate Panel  6. Vitamin D deficiency - VITAMIN D 25 Hydroxy (Vit-D Deficiency, Fractures)    General Counseling: Tunisha verbalizes understanding of the findings of todays visit and agrees with plan of treatment. I have discussed any further diagnostic evaluation that may be needed or ordered today. We also reviewed her medications today. she has been encouraged to call the office with any questions or concerns that should arise related to todays visit.    Orders Placed This Encounter  Procedures   PTH, Intact and Calcium   Calcium, ionized   B12 and Folate Panel   VITAMIN D 25 Hydroxy (Vit-D Deficiency, Fractures)   TSH + free T4    No orders of the defined types were placed in this encounter.   This patient was seen by Lynn Ito, PA-C in collaboration with Dr. Beverely Risen as a part of collaborative care agreement.   Total time spent:30 Minutes Time spent includes review of chart, medications, test results, and follow up  plan with the patient.      Dr Lyndon Code Internal medicine

## 2024-03-31 ENCOUNTER — Telehealth: Payer: Self-pay

## 2024-03-31 LAB — PTH, INTACT AND CALCIUM
Calcium: 10.5 mg/dL — ABNORMAL HIGH (ref 8.7–10.3)
PTH: 51 pg/mL (ref 15–65)

## 2024-03-31 LAB — CALCIUM, IONIZED: Calcium, Ion: 5.5 mg/dL (ref 4.5–5.6)

## 2024-03-31 LAB — B12 AND FOLATE PANEL
Folate: 15.6 ng/mL (ref >3.0)
Vitamin B-12: 850 pg/mL (ref 232–1245)

## 2024-03-31 LAB — VITAMIN D 25 HYDROXY (VIT D DEFICIENCY, FRACTURES): Vit D, 25-Hydroxy: 33.2 ng/mL (ref 30.0–100.0)

## 2024-03-31 LAB — TSH+FREE T4
Free T4: 1.23 ng/dL (ref 0.82–1.77)
TSH: 2.32 u[IU]/mL (ref 0.450–4.500)

## 2024-03-31 NOTE — Telephone Encounter (Signed)
-----   Message from Carlean Jews sent at 03/31/2024  1:45 PM EDT ----- Please let her know that her calcium is improving, but is still high and to ensure she is decreasing any supplement with calcium. Other labs looked good

## 2024-03-31 NOTE — Telephone Encounter (Signed)
Spoke with patient's daughter regarding lab results 

## 2024-04-17 ENCOUNTER — Other Ambulatory Visit: Payer: Self-pay | Admitting: Physician Assistant

## 2024-04-17 DIAGNOSIS — I10 Essential (primary) hypertension: Secondary | ICD-10-CM

## 2024-04-18 ENCOUNTER — Encounter: Payer: Self-pay | Admitting: Nurse Practitioner

## 2024-04-19 ENCOUNTER — Other Ambulatory Visit: Payer: Self-pay | Admitting: Physician Assistant

## 2024-04-19 DIAGNOSIS — I10 Essential (primary) hypertension: Secondary | ICD-10-CM

## 2024-05-28 ENCOUNTER — Other Ambulatory Visit: Payer: Self-pay | Admitting: Physician Assistant

## 2024-05-28 DIAGNOSIS — K219 Gastro-esophageal reflux disease without esophagitis: Secondary | ICD-10-CM

## 2024-06-10 ENCOUNTER — Other Ambulatory Visit: Payer: Self-pay

## 2024-06-10 ENCOUNTER — Emergency Department
Admission: EM | Admit: 2024-06-10 | Discharge: 2024-06-10 | Disposition: A | Discharge status: Home / Self Care | Attending: Emergency Medicine | Admitting: Emergency Medicine

## 2024-06-10 ENCOUNTER — Other Ambulatory Visit: Payer: Self-pay | Admitting: Cardiovascular Disease

## 2024-06-10 ENCOUNTER — Telehealth: Payer: Self-pay | Admitting: Physician Assistant

## 2024-06-10 DIAGNOSIS — R5383 Other fatigue: Secondary | ICD-10-CM | POA: Insufficient documentation

## 2024-06-10 DIAGNOSIS — E876 Hypokalemia: Secondary | ICD-10-CM | POA: Diagnosis not present

## 2024-06-10 DIAGNOSIS — R531 Weakness: Secondary | ICD-10-CM | POA: Diagnosis present

## 2024-06-10 DIAGNOSIS — E782 Mixed hyperlipidemia: Secondary | ICD-10-CM

## 2024-06-10 LAB — URINALYSIS, ROUTINE W REFLEX MICROSCOPIC
Bilirubin Urine: NEGATIVE
Glucose, UA: NEGATIVE mg/dL
Hgb urine dipstick: NEGATIVE
Ketones, ur: NEGATIVE mg/dL
Nitrite: NEGATIVE
Protein, ur: NEGATIVE mg/dL
Specific Gravity, Urine: 1.003 — ABNORMAL LOW (ref 1.005–1.030)
pH: 6 (ref 5.0–8.0)

## 2024-06-10 LAB — COMPREHENSIVE METABOLIC PANEL WITH GFR
ALT: 38 U/L (ref 0–44)
AST: 67 U/L — ABNORMAL HIGH (ref 15–41)
Albumin: 3.7 g/dL (ref 3.5–5.0)
Alkaline Phosphatase: 60 U/L (ref 38–126)
Anion gap: 11 (ref 5–15)
BUN: 13 mg/dL (ref 8–23)
CO2: 24 mmol/L (ref 22–32)
Calcium: 9.4 mg/dL (ref 8.9–10.3)
Chloride: 96 mmol/L — ABNORMAL LOW (ref 98–111)
Creatinine, Ser: 0.71 mg/dL (ref 0.44–1.00)
GFR, Estimated: >60 mL/min (ref >60)
Glucose, Bld: 141 mg/dL — ABNORMAL HIGH (ref 70–99)
Potassium: 2.8 mmol/L — ABNORMAL LOW (ref 3.5–5.1)
Sodium: 131 mmol/L — ABNORMAL LOW (ref 135–145)
Total Bilirubin: 1 mg/dL (ref 0.0–1.2)
Total Protein: 6.9 g/dL (ref 6.5–8.1)

## 2024-06-10 LAB — CBC
HCT: 37.6 % (ref 36.0–46.0)
Hemoglobin: 13.3 g/dL (ref 12.0–15.0)
MCH: 30.5 pg (ref 26.0–34.0)
MCHC: 35.4 g/dL (ref 30.0–36.0)
MCV: 86.2 fL (ref 80.0–100.0)
Platelets: 206 10*3/uL (ref 150–400)
RBC: 4.36 MIL/uL (ref 3.87–5.11)
RDW: 12.5 % (ref 11.5–15.5)
WBC: 7.3 10*3/uL (ref 4.0–10.5)
nRBC: 0 % (ref 0.0–0.2)

## 2024-06-10 LAB — TROPONIN I (HIGH SENSITIVITY)
Troponin I (High Sensitivity): 7 ng/L (ref <18)
Troponin I (High Sensitivity): 7 ng/L (ref <18)

## 2024-06-10 LAB — MAGNESIUM: Magnesium: 2.4 mg/dL (ref 1.7–2.4)

## 2024-06-10 MED ORDER — POTASSIUM CHLORIDE CRYS ER 20 MEQ PO TBCR
40.0000 meq | EXTENDED_RELEASE_TABLET | Freq: Once | ORAL | Status: AC
  Administered 2024-06-10: 40 meq via ORAL
  Filled 2024-06-10: qty 2

## 2024-06-10 MED ORDER — SODIUM CHLORIDE 0.9 % IV BOLUS
500.0000 mL | Freq: Once | INTRAVENOUS | Status: AC
  Administered 2024-06-10: 500 mL via INTRAVENOUS

## 2024-06-10 NOTE — Telephone Encounter (Signed)
 Patient called requesting to be seen due to sudden onset of weakness while working. Per cma, patient to go to urgent care since we have no available appointments today-Toni

## 2024-06-10 NOTE — ED Provider Triage Note (Addendum)
 Emergency Medicine Provider Triage Evaluation Note  Cynthia Keith , a 78 y.o. female  was evaluated in triage.  Pt complains of generalized weakness, feeling tired, difficulty breathing, symptoms started 2 hours ago.  Patient states she is coughing.  Patient denies fever, chest pain, vomit, diarrhea. Patient had breakfast this morning.  Review of Systems  Positive:  Negative:   Physical Exam  BP 127/78   Pulse 80   Temp 98.7 F (37.1 C) (Oral)   Resp 18   Ht 4' 8 (1.422 m)   Wt 57.6 kg   SpO2 98%   BMI 28.47 kg/m vital signs are normal Gen:   Awake, no distress   Resp:  Normal effort MSK:   Moves extremities without difficulty  Other:  Neurological: No focal deficits, strength  t 5/5   Medical Decision Making  Medically screening exam initiated at 3:26 PM.  Appropriate orders placed.  Cynthia Keith was informed that the remainder of the evaluation will be completed by another provider, this initial triage assessment does not replace that evaluation, and the importance of remaining in the ED until their evaluation is complete. Patient with generalized weakness, nauseous, difficulty breathing that started 2 hours ago.  Ordered CBC CMP, KG, UA, troponins, chest x-ray   Awilda Lennox, PA-C 06/10/24 1529    Awilda Lennox, PA-C 06/10/24 1539

## 2024-06-10 NOTE — ED Triage Notes (Signed)
 Patient states weakness and nausea for about 2-3 hours.

## 2024-06-10 NOTE — ED Provider Notes (Signed)
 [facility] Provider Note    Event Date/Time   First MD Initiated Contact with Patient 06/10/24 1756     (approximate)   History   Weakness   HPI  Cynthia Keith is a 78 y.o. female who presents to the emergency department today because of concerns for weakness and fatigue.  The patient states that she did not feel well yesterday.  When she got up this morning she did feel weak and fatigued.  While she was at work today it got significantly worse.  She feels the weakness throughout her body.  She denies any chest pain or palpitations with this.  Denies any recent fevers.  No change in urination.  Denies any change in medications or diet recently.     Physical Exam   Triage Vital Signs: ED Triage Vitals  Encounter Vitals Group     BP 06/10/24 1520 127/78     Girls Systolic BP Percentile --      Girls Diastolic BP Percentile --      Boys Systolic BP Percentile --      Boys Diastolic BP Percentile --      Pulse Rate 06/10/24 1520 80     Resp 06/10/24 1520 18     Temp 06/10/24 1520 98.7 F (37.1 C)     Temp Source 06/10/24 1520 Oral     SpO2 06/10/24 1520 98 %     Weight 06/10/24 1519 127 lb (57.6 kg)     Height 06/10/24 1519 4' 8 (1.422 m)     Head Circumference --      Peak Flow --      Pain Score 06/10/24 1520 0     Pain Loc --      Pain Education --      Exclude from Growth Chart --     Most recent vital signs: Vitals:   06/10/24 1520  BP: 127/78  Pulse: 80  Resp: 18  Temp: 98.7 F (37.1 C)  SpO2: 98%   General: Awake, alert, oriented. CV:  Good peripheral perfusion. Regular rate and rhythm. Resp:  Normal effort. Lungs clear. Abd:  No distention.    ED Results / Procedures / Treatments   Labs (all labs ordered are listed, but only abnormal results are displayed) Labs Reviewed  COMPREHENSIVE METABOLIC PANEL WITH GFR - Abnormal; Notable for the following components:      Result Value   Sodium 131 (*)    Potassium 2.8 (*)     Chloride 96 (*)    Glucose, Bld 141 (*)    AST 67 (*)    All other components within normal limits  URINALYSIS, ROUTINE W REFLEX MICROSCOPIC - Abnormal; Notable for the following components:   Color, Urine YELLOW (*)    APPearance CLEAR (*)    Specific Gravity, Urine 1.003 (*)    Leukocytes,Ua TRACE (*)    Bacteria, UA RARE (*)    All other components within normal limits  CBC  MAGNESIUM  CBG MONITORING, ED  TROPONIN I (HIGH SENSITIVITY)  TROPONIN I (HIGH SENSITIVITY)     EKG  I, Marylynn Soho, attending physician, personally viewed and interpreted this EKG  EKG Time: 1528 Rate: 72 Rhythm: normal sinus rhythm Axis: left axis deviation Intervals: qtc 464 QRS: incomplete RBBB, LAFB ST changes: no st elevation Impression: abnormal ekg    RADIOLOGY None   PROCEDURES:  Critical Care performed: No   MEDICATIONS ORDERED IN ED: Medications - No data to display  IMPRESSION / MDM / ASSESSMENT AND PLAN / ED COURSE  I reviewed the triage vital signs and the nursing notes.                              Differential diagnosis includes, but is not limited to, dehydration, anemia, infection  Patient's presentation is most consistent with acute presentation with potential threat to life or bodily function.  Patient presented to the emergency department today because of concerns for weakness.  Patient is afebrile here.  Workup is notable for hypokalemia.  Patient was given IV fluids and potassium here.  Did feel better.  Urine did show some signs of infection however patient is without any symptoms.  Will send for urine culture.  At this time I think likely patient was somewhat dehydrated and had low potassium.  No signs of cardiac etiology of the patient's symptoms.  Given that patient feels improved will plan on discharge home.     FINAL CLINICAL IMPRESSION(S) / ED DIAGNOSES   Final diagnoses:  Weakness  Hypokalemia     Note:  This document was prepared using  Dragon voice recognition software and may include unintentional dictation errors.    Marylynn Soho, MD 06/10/24 2126

## 2024-06-12 ENCOUNTER — Telehealth: Payer: Self-pay

## 2024-06-12 ENCOUNTER — Telehealth: Payer: Self-pay | Admitting: Physician Assistant

## 2024-06-12 ENCOUNTER — Other Ambulatory Visit: Payer: Self-pay

## 2024-06-12 DIAGNOSIS — E876 Hypokalemia: Secondary | ICD-10-CM

## 2024-06-12 MED ORDER — POTASSIUM CHLORIDE CRYS ER 20 MEQ PO TBCR: EXTENDED_RELEASE_TABLET | ORAL | 1 refills | Status: DC

## 2024-06-12 NOTE — Telephone Encounter (Signed)
Lvm to schedule ED follow up-Toni 

## 2024-06-12 NOTE — Telephone Encounter (Signed)
 Pt called that she went to ED urgent care and her potassium was low I spoke with pt husband and they gave her Ivy potassium advised as per lauren that repeat labs today  and hold potassium pres for now  until she do labs

## 2024-06-13 LAB — URINE CULTURE: Culture: 40000 — AB

## 2024-06-15 ENCOUNTER — Ambulatory Visit: Admitting: Nurse Practitioner

## 2024-06-16 LAB — BASIC METABOLIC PANEL WITH GFR
BUN/Creatinine Ratio: 9 — ABNORMAL LOW (ref 12–28)
BUN: 8 mg/dL (ref 8–27)
CO2: 23 mmol/L (ref 20–29)
Calcium: 10.8 mg/dL — ABNORMAL HIGH (ref 8.7–10.3)
Chloride: 93 mmol/L — ABNORMAL LOW (ref 96–106)
Creatinine, Ser: 0.89 mg/dL (ref 0.57–1.00)
Glucose: 131 mg/dL — ABNORMAL HIGH (ref 70–99)
Potassium: 3.4 mmol/L — ABNORMAL LOW (ref 3.5–5.2)
Sodium: 136 mmol/L (ref 134–144)
eGFR: 67 mL/min/{1.73_m2} (ref >59)

## 2024-06-18 ENCOUNTER — Ambulatory Visit: Payer: Self-pay | Admitting: Physician Assistant

## 2024-06-19 NOTE — Telephone Encounter (Signed)
-----   Message from Tinnie MARLA Pro sent at 06/18/2024  1:04 PM EDT ----- Calcium  is elevated again--pt needs to stop taking her supplement as previously discussed. Potassium is improved but is borderline low still. Please confirm if she has been taking potassium  supplement or not ----- Message ----- From: Interface, [facility] Lab Results In Sent: 06/16/2024   5:36 AM EDT To: Tinnie MARLA Pro, PA-C

## 2024-06-19 NOTE — Telephone Encounter (Addendum)
Spoke with patient's daughter regarding labs. 

## 2024-06-22 ENCOUNTER — Encounter: Payer: Self-pay | Admitting: Nurse Practitioner

## 2024-06-22 ENCOUNTER — Ambulatory Visit: Admitting: Nurse Practitioner

## 2024-06-22 VITALS — BP 136/70 | HR 70 | Temp 98.1°F | Resp 16 | Ht <= 58 in | Wt 137.4 lb

## 2024-06-22 DIAGNOSIS — Z79899 Other long term (current) drug therapy: Secondary | ICD-10-CM

## 2024-06-22 DIAGNOSIS — E876 Hypokalemia: Secondary | ICD-10-CM

## 2024-06-22 DIAGNOSIS — G609 Hereditary and idiopathic neuropathy, unspecified: Secondary | ICD-10-CM

## 2024-06-22 MED ORDER — GABAPENTIN 100 MG PO CAPS: 100.0000 mg | ORAL_CAPSULE | Freq: Three times a day (TID) | ORAL | 3 refills | Status: DC

## 2024-06-22 NOTE — Progress Notes (Signed)
 [facility] [address] [place], [place] [address]  Internal MEDICINE  Office Visit Note  Patient Name: Cynthia Keith  [id number]  [id number]  Date of Service: 06/22/2024  Chief Complaint  Patient presents with   Gastroesophageal Reflux   Hypertension   Hyperlipidemia   Follow-up    Ed f/u     HPI Cynthia Keith presents for a follow-up visit for low potassium, high calcium  and weakness.  Hypokalemia -- taking prescription supplement.  Hypercalcemia -- has been taking multivitamin twice daily. And her calcium  level is high when she is taking it twice daily. Has has been instructed to only take the multivitamin once daily multiple times but  Weakness --     Current Medication: Outpatient Encounter Medications as of 06/22/2024  Medication Sig   donepezil (ARICEPT) 10 MG tablet Take 1 tablet by mouth at bedtime.   aspirin  EC 81 MG tablet Take 81 mg by mouth daily.   busPIRone (BUSPAR) 5 MG tablet Take 5 mg by mouth 2 (two) times daily.   gabapentin  (NEURONTIN ) 100 MG capsule Take 1 capsule (100 mg total) by mouth 3 (three) times daily.   hydrALAZINE  (APRESOLINE ) 10 MG tablet TAKE 1 TABLET BY MOUTH THREE TIMES A DAY   hydrochlorothiazide  (HYDRODIURIL ) 12.5 MG tablet TAKE 1 TABLET BY MOUTH EVERY DAY   Multiple Vitamins-Minerals (HAIR SKIN & NAILS PO) Take by mouth.   omeprazole  (PRILOSEC) 40 MG capsule TAKE 1 CAPSULE (40 MG TOTAL) BY MOUTH DAILY.   OVER THE COUNTER MEDICATION Doterra-EO Mega/AlphaCRS/MIcro Plex VM/Deep Blue Veg capsules   rosuvastatin  (CRESTOR ) 20 MG tablet TAKE 1 TABLET BY MOUTH EVERY DAY   [DISCONTINUED] amLODipine  (NORVASC ) 10 MG tablet TAKE 1 TABLET BY MOUTH EVERY DAY   [DISCONTINUED] donepezil (ARICEPT) 5 MG tablet Take 1 tablet by mouth at bedtime.   [DISCONTINUED] gabapentin  (NEURONTIN ) 100 MG capsule Take 100 mg by mouth at bedtime.   [DISCONTINUED] potassium chloride  SA (KLOR-CON  M) 20 MEQ tablet Take 1 tab po daily for 3 days and then take 1 tab  po MW and F   No facility-administered encounter medications on file as of 06/22/2024.    Surgical History: Past Surgical History:  Procedure Laterality Date   ABDOMINAL HYSTERECTOMY     COLONOSCOPY WITH PROPOFOL  N/A 12/12/2015   Procedure: COLONOSCOPY WITH PROPOFOL ;  Surgeon: Lamar ONEIDA Holmes, MD;  Location[facility] ENDOSCOPY;  Service: Endoscopy;  Laterality: N/A;   HAND SURGERY  2005    Medical History: Past Medical History:  Diagnosis Date   Actinic keratosis 03/15/2021   L post thigh    Basal cell carcinoma 06/21/2021   L alar crease, EDC done 07/31/21   GERD (gastroesophageal reflux disease)    Hyperlipidemia    Hypertension    Malignant essential hypertension 02/26/2017   Plantar fasciitis    Sleep apnea    Urinary tract infection     Family History: Family History  Problem Relation Age of Onset   Breast cancer Cousin    Stroke Mother    Heart disease Paternal Grandmother    Cancer Paternal Grandfather    Stroke Sister    Stroke Brother     Social History   Socioeconomic History   Marital status: Married    Spouse name: Not on file   Number of children: Not on file   Years of education: Not on file   Highest education level: Not on file  Occupational History   Not on file  Tobacco Use   Smoking  status: Never   Smokeless tobacco: Never  Vaping Use   Vaping status: Never Used  Substance and Sexual Activity   Alcohol use: Not Currently    Comment: ocassionally   Drug use: No   Sexual activity: Yes    Birth control/protection: Post-menopausal, Surgical  Other Topics Concern   Not on file  Social History Narrative   Not on file   Social Drivers of Health   Financial Resource Strain: Not on file  Food Insecurity: No Food Insecurity (10/20/2022)   Hunger Vital Sign    Worried About Running Out of Food in the Last Year: Never true    Ran Out of Food in the Last Year: Never true  Transportation Needs: No Transportation Needs (10/20/2022)   PRAPARE -  Administrator, Civil Service (Medical): No    Lack of Transportation (Non-Medical): No  Physical Activity: Not on file  Stress: Not on file  Social Connections: Not on file  Intimate Partner Violence: Not At Risk (10/20/2022)   Humiliation, Afraid, Rape, and Kick questionnaire    Fear of Current or Ex-Partner: No    Emotionally Abused: No    Physically Abused: No    Sexually Abused: No      Review of Systems  Constitutional:  Positive for fatigue. Negative for chills and unexpected weight change.  HENT:  Negative for congestion, postnasal drip, rhinorrhea, sneezing and sore throat.   Eyes:  Negative for redness.  Respiratory:  Negative for chest tightness, shortness of breath and wheezing.   Cardiovascular:  Negative for chest pain and palpitations.  Gastrointestinal:  Negative for abdominal pain, constipation, diarrhea, nausea and vomiting.  Genitourinary:  Negative for dysuria and frequency.  Musculoskeletal:  Negative for arthralgias, joint swelling and neck pain.  Skin:  Negative for rash.  Neurological: Negative.  Negative for tremors and numbness.  Hematological:  Negative for adenopathy. Does not bruise/bleed easily.  Psychiatric/Behavioral:  Negative for behavioral problems (Depression), sleep disturbance and suicidal ideas. The patient is not nervous/anxious.        Memory loss    Vital Signs: BP 136/70   Pulse 70   Temp 98.1 F (36.7 C)   Resp 16   Ht 4' 8.5 (1.435 m)   Wt 137 lb 6.4 oz (62.3 kg)   SpO2 95%   BMI 30.26 kg/m    Physical Exam Vitals and nursing note reviewed.  Constitutional:      Appearance: Normal appearance.  HENT:     Head: Normocephalic and atraumatic.  Eyes:     Extraocular Movements: Extraocular movements intact.  Cardiovascular:     Rate and Rhythm: Normal rate and regular rhythm.  Pulmonary:     Effort: Pulmonary effort is normal.     Breath sounds: Normal breath sounds.  Skin:    General: Skin is warm and dry.   Neurological:     Mental Status: She is alert and oriented to person, place, and time.     Gait: Gait normal.     Comments: Pt repeats herself in office and requires repeat instructions  Psychiatric:        Mood and Affect: Mood normal.        Behavior: Behavior normal.        Assessment/Plan: 1. Hypercalcemia Labs ordered. Also patient instructed to only take her multivitamin once a day. Do not take the multivitamin twice a day.  - Ca+Creat+P+PTH Intact  2. Hypokalemia (Primary) Repeat potassium level  - Potassium  3.  Hereditary and idiopathic peripheral neuropathy Continue gabapentin  as prescribed.  - gabapentin  (NEURONTIN ) 100 MG capsule; Take 1 capsule (100 mg total) by mouth 3 (three) times daily.  Dispense: 90 capsule; Refill: 3  4. Encounter for medication review Medication list reviewed, updated and refills ordered.  - busPIRone (BUSPAR) 5 MG tablet; Take 5 mg by mouth 2 (two) times daily. - donepezil (ARICEPT) 10 MG tablet; Take 1 tablet by mouth at bedtime.   General Counseling: kelcey korus understanding of the findings of todays visit and agrees with plan of treatment. I have discussed any further diagnostic evaluation that may be needed or ordered today. We also reviewed her medications today. she has been encouraged to call the office with any questions or concerns that should arise related to todays visit.    Orders Placed This Encounter  Procedures   Ca+Creat+P+PTH Intact   Potassium    Meds ordered this encounter  Medications   gabapentin  (NEURONTIN ) 100 MG capsule    Sig: Take 1 capsule (100 mg total) by mouth 3 (three) times daily.    Dispense:  90 capsule    Refill:  3    Return if symptoms worsen or fail to improve.   Total time spent:30 Minutes Time spent includes review of chart, medications, test results, and follow up plan with the patient.   [place] Controlled Substance Database was reviewed by me.  This patient was seen by Mardy Maxin, FNP-C in collaboration with Dr. Sigrid Bathe as a part of collaborative care agreement.   Agripina Guyette R. Maxin, MSN, FNP-C Internal medicine

## 2024-07-08 LAB — POTASSIUM: Potassium: 3.3 mmol/L — ABNORMAL LOW (ref 3.5–5.2)

## 2024-07-08 LAB — CA+CREAT+P+PTH INTACT
Calcium: 10.1 mg/dL (ref 8.7–10.3)
Creatinine, Ser: 0.74 mg/dL (ref 0.57–1.00)
PTH: 69 pg/mL — ABNORMAL HIGH (ref 15–65)
Phosphorus: 3.3 mg/dL (ref 3.0–4.3)
eGFR: 83 mL/min/1.73 (ref >59)

## 2024-07-12 ENCOUNTER — Other Ambulatory Visit: Payer: Self-pay | Admitting: Physician Assistant

## 2024-07-12 DIAGNOSIS — I10 Essential (primary) hypertension: Secondary | ICD-10-CM

## 2024-07-13 ENCOUNTER — Encounter: Payer: Self-pay | Admitting: Physician Assistant

## 2024-07-13 ENCOUNTER — Ambulatory Visit (INDEPENDENT_AMBULATORY_CARE_PROVIDER_SITE_OTHER): Admitting: Physician Assistant

## 2024-07-13 VITALS — BP 128/84 | HR 64 | Temp 96.7°F | Resp 16 | Ht <= 58 in | Wt 140.2 lb

## 2024-07-13 DIAGNOSIS — I10 Essential (primary) hypertension: Secondary | ICD-10-CM | POA: Diagnosis not present

## 2024-07-13 DIAGNOSIS — E876 Hypokalemia: Secondary | ICD-10-CM

## 2024-07-13 DIAGNOSIS — R7989 Other specified abnormal findings of blood chemistry: Secondary | ICD-10-CM | POA: Diagnosis not present

## 2024-07-13 DIAGNOSIS — R21 Rash and other nonspecific skin eruption: Secondary | ICD-10-CM | POA: Diagnosis not present

## 2024-07-13 MED ORDER — POTASSIUM CHLORIDE CRYS ER 20 MEQ PO TBCR: EXTENDED_RELEASE_TABLET | ORAL | 1 refills | Status: DC

## 2024-07-13 MED ORDER — TRIAMCINOLONE ACETONIDE 0.1 % EX CREA: 1.0000 | TOPICAL_CREAM | Freq: Two times a day (BID) | CUTANEOUS | 0 refills | Status: AC

## 2024-07-13 NOTE — Progress Notes (Signed)
 [facility] [address] [place], [place] [address]  Internal MEDICINE  Office Visit Note  Patient Name: Cynthia Keith  [id number]  [id number]  Date of Service: 07/13/2024  Chief Complaint  Patient presents with   Gastroesophageal Reflux   Hyperlipidemia   Hypertension   Follow-up    HPI Pt is here for routine follow up, her husband is with her -BP stable -Taking potassium 3 days per week, will start taking daily now and recheck labs since still low -taking doterra once per day now instead of twice and calcium  now better. PTH borderline though -itchy rash on back of right leg, using CeraVe anti itch lotion but rash not getting better. Will send steroid cream and pt will contact her dermatologist if not improving still  Current Medication: Outpatient Encounter Medications as of 07/13/2024  Medication Sig   triamcinolone  cream (KENALOG ) 0.1 % Apply 1 Application topically 2 (two) times daily.   amLODipine  (NORVASC ) 10 MG tablet TAKE 1 TABLET BY MOUTH EVERY DAY   aspirin  EC 81 MG tablet Take 81 mg by mouth daily.   busPIRone (BUSPAR) 5 MG tablet Take 5 mg by mouth 2 (two) times daily.   donepezil (ARICEPT) 10 MG tablet Take 1 tablet by mouth at bedtime.   gabapentin  (NEURONTIN ) 100 MG capsule Take 1 capsule (100 mg total) by mouth 3 (three) times daily.   hydrALAZINE  (APRESOLINE ) 10 MG tablet TAKE 1 TABLET BY MOUTH THREE TIMES A DAY   hydrochlorothiazide  (HYDRODIURIL ) 12.5 MG tablet TAKE 1 TABLET BY MOUTH EVERY DAY   Multiple Vitamins-Minerals (HAIR SKIN & NAILS PO) Take by mouth.   omeprazole  (PRILOSEC) 40 MG capsule TAKE 1 CAPSULE (40 MG TOTAL) BY MOUTH DAILY.   OVER THE COUNTER MEDICATION Doterra-EO Mega/AlphaCRS/MIcro Plex VM/Deep Blue Veg capsules   potassium chloride  SA (KLOR-CON  M) 20 MEQ tablet Take 1 tab po daily   rosuvastatin  (CRESTOR ) 20 MG tablet TAKE 1 TABLET BY MOUTH EVERY DAY   [DISCONTINUED] potassium chloride  SA (KLOR-CON  M) 20 MEQ tablet Take 1 tab  po daily for 3 days and then take 1 tab po MW and F   No facility-administered encounter medications on file as of 07/13/2024.    Surgical History: Past Surgical History:  Procedure Laterality Date   ABDOMINAL HYSTERECTOMY     COLONOSCOPY WITH PROPOFOL  N/A 12/12/2015   Procedure: COLONOSCOPY WITH PROPOFOL ;  Surgeon: Lamar ONEIDA Holmes, MD;  Location: [facility] ENDOSCOPY;  Service: Endoscopy;  Laterality: N/A;   HAND SURGERY  2005    Medical History: Past Medical History:  Diagnosis Date   Actinic keratosis 03/15/2021   L post thigh    Basal cell carcinoma 06/21/2021   L alar crease, EDC done 07/31/21   GERD (gastroesophageal reflux disease)    Hyperlipidemia    Hypertension    Malignant essential hypertension 02/26/2017   Plantar fasciitis    Sleep apnea    Urinary tract infection     Family History: Family History  Problem Relation Age of Onset   Breast cancer Cousin    Stroke Mother    Heart disease Paternal Grandmother    Cancer Paternal Grandfather    Stroke Sister    Stroke Brother     Social History   Socioeconomic History   Marital status: Married    Spouse name: Not on file   Number of children: Not on file   Years of education: Not on file   Highest education level: Not on file  Occupational History  Not on file  Tobacco Use   Smoking status: Never   Smokeless tobacco: Never  Vaping Use   Vaping status: Never Used  Substance and Sexual Activity   Alcohol use: Not Currently    Comment: ocassionally   Drug use: No   Sexual activity: Yes    Birth control/protection: Post-menopausal, Surgical  Other Topics Concern   Not on file  Social History Narrative   Not on file   Social Drivers of Health   Financial Resource Strain: Not on file  Food Insecurity: No Food Insecurity (10/20/2022)   Hunger Vital Sign    Worried About Running Out of Food in the Last Year: Never true    Ran Out of Food in the Last Year: Never true  Transportation Needs: No  Transportation Needs (10/20/2022)   PRAPARE - Administrator, Civil Service (Medical): No    Lack of Transportation (Non-Medical): No  Physical Activity: Not on file  Stress: Not on file  Social Connections: Not on file  Intimate Partner Violence: Not At Risk (10/20/2022)   Humiliation, Afraid, Rape, and Kick questionnaire    Fear of Current or Ex-Partner: No    Emotionally Abused: No    Physically Abused: No    Sexually Abused: No      Review of Systems  Constitutional:  Negative for chills and unexpected weight change.  HENT:  Negative for congestion, postnasal drip, rhinorrhea, sneezing and sore throat.   Eyes:  Negative for redness.  Respiratory:  Negative for chest tightness, shortness of breath and wheezing.   Cardiovascular:  Negative for chest pain and palpitations.  Gastrointestinal:  Negative for abdominal pain, constipation, diarrhea, nausea and vomiting.  Genitourinary:  Negative for dysuria and frequency.  Musculoskeletal:  Negative for arthralgias, joint swelling and neck pain.  Skin:  Positive for rash.       Itchy rash on leg  Neurological: Negative.  Negative for tremors and numbness.  Hematological:  Negative for adenopathy. Does not bruise/bleed easily.  Psychiatric/Behavioral:  Negative for behavioral problems (Depression), sleep disturbance and suicidal ideas. The patient is not nervous/anxious.        Memory loss    Vital Signs: BP 128/84   Pulse 64   Temp (!) 96.7 F (35.9 C)   Resp 16   Ht 4' 8.5 (1.435 m)   Wt 140 lb 3.2 oz (63.6 kg)   SpO2 99%   BMI 30.88 kg/m    Physical Exam Vitals and nursing note reviewed.  Constitutional:      General: She is not in acute distress.    Appearance: Normal appearance. She is well-developed. She is obese. She is not diaphoretic.  HENT:     Head: Normocephalic and atraumatic.  Eyes:     Extraocular Movements: Extraocular movements intact.  Neck:     Thyroid : No thyromegaly.     Vascular:  No JVD.     Trachea: No tracheal deviation.  Cardiovascular:     Rate and Rhythm: Normal rate and regular rhythm.     Heart sounds: Normal heart sounds. No murmur heard.    No friction rub. No gallop.  Pulmonary:     Effort: Pulmonary effort is normal. No respiratory distress.     Breath sounds: No wheezing or rales.  Chest:     Chest wall: No tenderness.  Musculoskeletal:        General: Normal range of motion.  Skin:    General: Skin is warm and dry.  Findings: Rash present.     Comments: Large rash on back of right lower leg  Neurological:     Mental Status: She is alert.  Psychiatric:        Behavior: Behavior normal.        Thought Content: Thought content normal.        Judgment: Judgment normal.        Assessment/Plan: 1. Essential hypertension (Primary) Stable, continue current medications  2. Low serum potassium Will increase supplement to daily and recheck labs in 2 weeks - Basic Metabolic Panel (BMET) - potassium chloride  SA (KLOR-CON  M) 20 MEQ tablet; Take 1 tab po daily  Dispense: 30 tablet; Refill: 1  3. Elevated parathyroid hormone Borderline and calcium  now improved. Will recheck level - Parathyroid hormone, intact (no Ca)  4. Rash and nonspecific skin eruption Will use kenalog  cream and will follow up with dermatology if not improving - triamcinolone  cream (KENALOG ) 0.1 %; Apply 1 Application topically 2 (two) times daily.  Dispense: 30 g; Refill: 0   General Counseling: Bonna verbalizes understanding of the findings of todays visit and agrees with plan of treatment. I have discussed any further diagnostic evaluation that may be needed or ordered today. We also reviewed her medications today. she has been encouraged to call the office with any questions or concerns that should arise related to todays visit.    Orders Placed This Encounter  Procedures   Basic Metabolic Panel (BMET)   Parathyroid hormone, intact (no Ca)    Meds ordered this  encounter  Medications   triamcinolone  cream (KENALOG ) 0.1 %    Sig: Apply 1 Application topically 2 (two) times daily.    Dispense:  30 g    Refill:  0   potassium chloride  SA (KLOR-CON  M) 20 MEQ tablet    Sig: Take 1 tab po daily    Dispense:  30 tablet    Refill:  1    This patient was seen by Tinnie Pro, PA-C in collaboration with Dr. Sigrid Bathe as a part of collaborative care agreement.   Total time spent:30 Minutes Time spent includes review of chart, medications, test results, and follow up plan with the patient.      Dr Fozia M Khan Internal medicine

## 2024-07-15 ENCOUNTER — Other Ambulatory Visit: Payer: Self-pay | Admitting: Physician Assistant

## 2024-07-15 DIAGNOSIS — E782 Mixed hyperlipidemia: Secondary | ICD-10-CM

## 2024-07-25 ENCOUNTER — Encounter: Payer: Self-pay | Admitting: Nurse Practitioner

## 2024-07-27 ENCOUNTER — Inpatient Hospital Stay: Payer: Medicare HMO | Attending: Oncology

## 2024-07-27 DIAGNOSIS — N289 Disorder of kidney and ureter, unspecified: Secondary | ICD-10-CM | POA: Insufficient documentation

## 2024-08-03 ENCOUNTER — Inpatient Hospital Stay: Payer: Medicare HMO | Admitting: Oncology

## 2024-08-03 ENCOUNTER — Other Ambulatory Visit

## 2024-08-03 ENCOUNTER — Encounter: Payer: Self-pay | Admitting: Oncology

## 2024-08-04 ENCOUNTER — Other Ambulatory Visit: Payer: Self-pay | Admitting: Physician Assistant

## 2024-08-04 DIAGNOSIS — E876 Hypokalemia: Secondary | ICD-10-CM

## 2024-08-10 ENCOUNTER — Inpatient Hospital Stay

## 2024-08-10 DIAGNOSIS — N289 Disorder of kidney and ureter, unspecified: Secondary | ICD-10-CM | POA: Diagnosis not present

## 2024-08-10 LAB — CBC WITH DIFFERENTIAL (CANCER CENTER ONLY)
Abs Immature Granulocytes: 0.03 K/uL (ref 0.00–0.07)
Basophils Absolute: 0.1 K/uL (ref 0.0–0.1)
Basophils Relative: 1 %
Eosinophils Absolute: 0.2 K/uL (ref 0.0–0.5)
Eosinophils Relative: 3 %
HCT: 42.6 % (ref 36.0–46.0)
Hemoglobin: 14.7 g/dL (ref 12.0–15.0)
Immature Granulocytes: 0 %
Lymphocytes Relative: 35 %
Lymphs Abs: 3 K/uL (ref 0.7–4.0)
MCH: 31.5 pg (ref 26.0–34.0)
MCHC: 34.5 g/dL (ref 30.0–36.0)
MCV: 91.2 fL (ref 80.0–100.0)
Monocytes Absolute: 0.7 K/uL (ref 0.1–1.0)
Monocytes Relative: 8 %
Neutro Abs: 4.5 K/uL (ref 1.7–7.7)
Neutrophils Relative %: 53 %
Platelet Count: 237 K/uL (ref 150–400)
RBC: 4.67 MIL/uL (ref 3.87–5.11)
RDW: 13.4 % (ref 11.5–15.5)
WBC Count: 8.5 K/uL (ref 4.0–10.5)
nRBC: 0 % (ref 0.0–0.2)

## 2024-08-10 LAB — IRON AND TIBC
Iron: 89 ug/dL (ref 28–170)
Saturation Ratios: 23 % (ref 10.4–31.8)
TIBC: 395 ug/dL (ref 250–450)
UIBC: 306 ug/dL

## 2024-08-10 LAB — HEPATIC FUNCTION PANEL
ALT: 26 U/L (ref 0–44)
AST: 34 U/L (ref 15–41)
Albumin: 4.4 g/dL (ref 3.5–5.0)
Alkaline Phosphatase: 81 U/L (ref 38–126)
Bilirubin, Direct: <0.1 mg/dL (ref 0.0–0.2)
Total Bilirubin: 0.8 mg/dL (ref 0.0–1.2)
Total Protein: 8.1 g/dL (ref 6.5–8.1)

## 2024-08-10 LAB — FERRITIN: Ferritin: 71 ng/mL (ref 11–307)

## 2024-08-17 ENCOUNTER — Inpatient Hospital Stay: Admitting: Oncology

## 2024-08-17 ENCOUNTER — Encounter: Payer: Self-pay | Admitting: Oncology

## 2024-09-06 ENCOUNTER — Other Ambulatory Visit: Payer: Self-pay | Admitting: Physician Assistant

## 2024-09-06 DIAGNOSIS — E876 Hypokalemia: Secondary | ICD-10-CM

## 2024-09-07 NOTE — Telephone Encounter (Signed)
 Spoke with pt need to repeat labs

## 2024-09-09 ENCOUNTER — Other Ambulatory Visit: Payer: Self-pay

## 2024-09-09 ENCOUNTER — Ambulatory Visit: Payer: Self-pay | Admitting: Physician Assistant

## 2024-09-09 DIAGNOSIS — E876 Hypokalemia: Secondary | ICD-10-CM

## 2024-09-09 LAB — BASIC METABOLIC PANEL WITH GFR
BUN/Creatinine Ratio: 14 (ref 12–28)
BUN: 13 mg/dL (ref 8–27)
CO2: 25 mmol/L (ref 20–29)
Calcium: 10.2 mg/dL (ref 8.7–10.3)
Chloride: 101 mmol/L (ref 96–106)
Creatinine, Ser: 0.9 mg/dL (ref 0.57–1.00)
Glucose: 143 mg/dL — ABNORMAL HIGH (ref 70–99)
Potassium: 3.8 mmol/L (ref 3.5–5.2)
Sodium: 141 mmol/L (ref 134–144)
eGFR: 66 mL/min/1.73 (ref >59)

## 2024-09-09 LAB — PARATHYROID HORMONE, INTACT (NO CA): PTH: 60 pg/mL (ref 15–65)

## 2024-09-09 MED ORDER — POTASSIUM CHLORIDE CRYS ER 20 MEQ PO TBCR: EXTENDED_RELEASE_TABLET | ORAL | 1 refills | Status: DC

## 2024-09-09 NOTE — Telephone Encounter (Signed)
-----   Message from Tinnie MARLA Pro sent at 09/09/2024 10:43 AM EDT ----- Please let her know that her potassium is improved on supplement and she should continue. Please send her refills ----- Message ----- From: Interface, [facility] Lab Results In Sent: 09/08/2024   7:37 AM EDT To: Tinnie MARLA Pro, PA-C

## 2024-09-09 NOTE — Telephone Encounter (Signed)
 Spoke with patient's daughter regarding labs, refilled potassium per Tinnie.

## 2024-09-14 ENCOUNTER — Ambulatory Visit: Admitting: Cardiology

## 2024-09-21 ENCOUNTER — Ambulatory Visit: Attending: Cardiology | Admitting: Cardiology

## 2024-09-21 NOTE — Progress Notes (Deleted)
 Cardiology Office Note    Date:  09/21/2024   ID:  Cynthia Keith, Cynthia Keith 1946-09-11, MRN [id number]  PCP:  Kristina Tinnie POUR, PA-C  Cardiologist:  Deatrice Cage, MD  Electrophysiologist:  None   Chief Complaint: Follow-up  History of Present Illness:   Cynthia Keith is a 78 y.o. female with history of hypertension, hyperlipidemia, sleep apnea, GERD, coronary calcifications on CT scan, and aortic atherosclerosis who presents for follow-up on***.    Abdominal ultrasound in 2021 showed no evidence of aortic aneurysm.  Patient was hospitalized 09/2022 for atypical chest pain with negative troponin.  Echo at that time showed normal LV systolic function with mild MR and mild TR.  Patient had a CT chest done in 08/2023 which showed coronary artery calcifications and aortic atherosclerosis.   Patient was seen by and established with Dr. Cage 11/2023 after CT chest showed incidental finding of coronary artery calcifications and aortic atherosclerosis.  At that time, she reported intermittent chest pain which was in the setting of recurrent cough with subsequent improvement in symptoms.  She did report dyspnea with minimal exertion and occasional episodes of substernal chest tightness.  Her dose of rosuvastatin  was increased.  Cardiac PET stress was ordered and completed 12/2023 which revealed normal LV perfusion with no evidence of ischemia or infarction, EF 67%.  Coronary calcification was present in the LAD and RCA distributions.  Overall low risk study. ***  Labs independently reviewed: 08/2024-BUN 13, creatinine 0.90, sodium 141, potassium 3.8 07/2024-Hgb 14.7, HCT 42.6, platelets 237, normal LFTs 02/2024-TC 147, TG 108, HDL 59, LDL 69  Objective   Past Medical History:  Diagnosis Date   Actinic keratosis 03/15/2021   L post thigh    Basal cell carcinoma 06/21/2021   L alar crease, EDC done 07/31/21   GERD (gastroesophageal reflux disease)    Hyperlipidemia    Hypertension    Malignant  essential hypertension 02/26/2017   Plantar fasciitis    Sleep apnea    Urinary tract infection     Current Medications: No outpatient medications have been marked as taking for the 09/21/24 encounter (Appointment) with Gerard Frederick, NP.    Allergies:   Iodinated contrast media and Iodine   Social History   Socioeconomic History   Marital status: Married    Spouse name: Not on file   Number of children: Not on file   Years of education: Not on file   Highest education level: Not on file  Occupational History   Not on file  Tobacco Use   Smoking status: Never   Smokeless tobacco: Never  Vaping Use   Vaping status: Never Used  Substance and Sexual Activity   Alcohol use: Not Currently    Comment: ocassionally   Drug use: No   Sexual activity: Yes    Birth control/protection: Post-menopausal, Surgical  Other Topics Concern   Not on file  Social History Narrative   Not on file   Social Drivers of Health   Financial Resource Strain: Not on file  Food Insecurity: No Food Insecurity (10/20/2022)   Hunger Vital Sign    Worried About Running Out of Food in the Last Year: Never true    Ran Out of Food in the Last Year: Never true  Transportation Needs: No Transportation Needs (10/20/2022)   PRAPARE - Administrator, Civil Service (Medical): No    Lack of Transportation (Non-Medical): No  Physical Activity: Not on file  Stress: Not on file  Social Connections: Not on file     Family History:  The patient's family history includes Breast cancer in her cousin; Cancer in her paternal grandfather; Heart disease in her paternal grandmother; Stroke in her brother, mother, and sister.  ROS:   12-point review of systems is negative unless otherwise noted in the HPI.  EKGs/Other Studies Reviewed:    Studies reviewed were summarized above. The additional studies were reviewed today:  12/2023 cardiac PET stress   LV perfusion is normal. There is no evidence of  ischemia. There is no evidence of infarction.   Rest left ventricular function is normal. Rest EF: 67%. Stress left ventricular function is normal. Stress EF: 73%. End diastolic cavity size is normal.   Myocardial blood flow was computed to be 0.75ml/g/min at rest and 2.55ml/g/min at stress. Global myocardial blood flow reserve was 3.40 and was normal.   Coronary calcium  was present on the attenuation correction CT images. Moderate coronary calcifications were present. Coronary calcifications were present in the left anterior descending artery and right coronary artery distribution(s).   The study is normal. The study is low risk.  09/2022 Echo complete 1. Left ventricular ejection fraction, by estimation, is 65 to 70%. The  left ventricle has normal function. The left ventricle has no regional  wall motion abnormalities. There is mild left ventricular hypertrophy.  Left ventricular diastolic parameters  are consistent with Grade I diastolic dysfunction (impaired relaxation).   2. Right ventricular systolic function is mildly reduced. The right  ventricular size is mildly enlarged. Mildly increased right ventricular  wall thickness. There is normal pulmonary artery systolic pressure.   3. Left atrial size was mild to moderately dilated.   4. Right atrial size was mild to moderately dilated.   5. The mitral valve is grossly normal. Mild to moderate mitral valve  regurgitation.   6. Tricuspid valve regurgitation is mild to moderate.   7. The aortic valve is calcified. Aortic valve regurgitation is mild to  moderate. Aortic valve sclerosis/calcification is present, without any  evidence of aortic stenosis.   EKG:  EKG personally reviewed by me today    PHYSICAL EXAM:    VS:  There were no vitals taken for this visit.  BMI: There is no height or weight on file to calculate BMI.  GEN: Well nourished, well developed in no acute distress NECK: No JVD; No carotid bruits CARDIAC: ***RRR, no  murmurs, rubs, gallops RESPIRATORY:  Clear to auscultation without rales, wheezing or rhonchi  ABDOMEN: Soft, non-tender, non-distended EXTREMITIES:  *** No edema; No deformity  Wt Readings from Last 3 Encounters:  07/13/24 140 lb 3.2 oz (63.6 kg)  06/22/24 137 lb 6.4 oz (62.3 kg)  06/10/24 127 lb (57.6 kg)                  ASSESSMENT & PLAN:   Coronary artery disease - Coronary artery calcifications noted on chest CT. Cardiac PET completed 12/2023 was without evidence of ischemia or infarction. Overall low risk.   Aortic atherosclerosis   Essential hypertension   Hyperlipidemia  - Most recent lipid panel 02/2024 with LDL 69.   {Are you ordering a CV Procedure (e.g. stress test, cath, DCCV, TEE, etc)?   Press F2        :[id number]}   Disposition: F/u with Dr. Darron or an APP in ***.   Medication Adjustments/Labs and Tests Ordered: Current medicines are reviewed at length with the patient today.  Concerns regarding medicines are outlined above.  Medication changes, Labs and Tests ordered today are summarized above and listed in the Patient Instructions accessible in Encounters.   Bonney Lesley Maffucci, PA-C 09/21/2024 8:38 AM     [facility] [place] [address] [place], [place] [address] [phone]

## 2024-09-28 ENCOUNTER — Encounter: Payer: Self-pay | Admitting: Oncology

## 2024-09-28 ENCOUNTER — Inpatient Hospital Stay: Attending: Oncology | Admitting: Oncology

## 2024-10-05 ENCOUNTER — Telehealth: Payer: Self-pay | Admitting: Physician Assistant

## 2024-10-05 ENCOUNTER — Encounter: Payer: Self-pay | Admitting: Physician Assistant

## 2024-10-05 ENCOUNTER — Ambulatory Visit (INDEPENDENT_AMBULATORY_CARE_PROVIDER_SITE_OTHER): Payer: Medicare HMO | Admitting: Physician Assistant

## 2024-10-05 VITALS — BP 130/70 | HR 66 | Temp 97.8°F | Resp 16 | Ht <= 58 in | Wt 145.0 lb

## 2024-10-05 DIAGNOSIS — E876 Hypokalemia: Secondary | ICD-10-CM | POA: Diagnosis not present

## 2024-10-05 DIAGNOSIS — R7989 Other specified abnormal findings of blood chemistry: Secondary | ICD-10-CM | POA: Diagnosis not present

## 2024-10-05 DIAGNOSIS — I10 Essential (primary) hypertension: Secondary | ICD-10-CM

## 2024-10-05 DIAGNOSIS — M810 Age-related osteoporosis without current pathological fracture: Secondary | ICD-10-CM | POA: Diagnosis not present

## 2024-10-05 DIAGNOSIS — Z23 Encounter for immunization: Secondary | ICD-10-CM | POA: Diagnosis not present

## 2024-10-05 DIAGNOSIS — Z0000 Encounter for general adult medical examination without abnormal findings: Secondary | ICD-10-CM

## 2024-10-05 MED ORDER — SHINGRIX 50 MCG/0.5ML IM SUSR: 0.5000 mL | Freq: Once | INTRAMUSCULAR | 0 refills | Status: AC

## 2024-10-05 NOTE — Telephone Encounter (Signed)
 Notified patient of dexa appointment date, arrival time, location. No calcium  supplements & no children under age 78-Toni

## 2024-10-05 NOTE — Progress Notes (Signed)
 [facility] [address] [place], [place] [address]  Internal MEDICINE  Office Visit Note  Patient Name: Cynthia Keith  [id number]  [id number]  Date of Service: 10/05/2024  Chief Complaint  Patient presents with   Medicare Wellness    HPI Cynthia Keith presents for an annual well visit Well-appearing 78 y.o. female  DEXA scan: Will update, previously declined tx for osteoporosis but needs to be updated to check progression Labs: reviewed -Still working T-F and some Saturdays, stays active with this -feels fortunate to be doing well -BP stable, potassium now stable on supplement and will continue -still encouraged to cut back on Doterra vitamins to once per day since calcium  still borderline -followed by neurology for memory loss     10/05/2024    9:54 AM 09/30/2023   10:01 AM 11/26/2022    9:26 AM  MMSE - Mini Mental State Exam  Orientation to time 4 5 3   Orientation to Place 5 5 3   Registration 3 3 3   Attention/ Calculation 5 5 5   Recall 3 3 2   Language- name 2 objects 2 2 2   Language- repeat 0 1 1  Language- follow 3 step command 3 3 3   Language- read & follow direction 1 1 1   Write a sentence 1 1 1   Copy design 1 1 1   Total score 28 30 25     Functional Status Survey: Is the patient deaf or have difficulty hearing?: No Does the patient have difficulty seeing, even when wearing glasses/contacts?: No Does the patient have difficulty concentrating, remembering, or making decisions?: No Does the patient have difficulty walking or climbing stairs?: No Does the patient have difficulty dressing or bathing?: No Does the patient have difficulty doing errands alone such as visiting a doctor's office or shopping?: No     01/28/2023    8:35 AM 06/17/2023    9:48 AM 09/30/2023   10:00 AM 03/16/2024    8:34 AM 10/05/2024    9:57 AM  Fall Risk  Falls in the past year? 0 1 0 0 0  Was there an injury with Fall?  0     Fall Risk Category Calculator  1     Patient at  Risk for Falls Due to    No Fall Risks No Fall Risks  Fall risk Follow up    Falls evaluation completed Falls evaluation completed       10/05/2024    9:58 AM  Depression screen PHQ 2/9  Decreased Interest 0  Down, Depressed, Hopeless 0  PHQ - 2 Score 0        No data to display            Current Medication: Outpatient Encounter Medications as of 10/05/2024  Medication Sig   amLODipine  (NORVASC ) 10 MG tablet TAKE 1 TABLET BY MOUTH EVERY DAY   aspirin  EC 81 MG tablet Take 81 mg by mouth daily.   busPIRone (BUSPAR) 5 MG tablet Take 5 mg by mouth 2 (two) times daily.   donepezil (ARICEPT) 10 MG tablet Take 1 tablet by mouth at bedtime.   gabapentin  (NEURONTIN ) 100 MG capsule Take 1 capsule (100 mg total) by mouth 3 (three) times daily.   hydrALAZINE  (APRESOLINE ) 10 MG tablet TAKE 1 TABLET BY MOUTH THREE TIMES A DAY   hydrochlorothiazide  (HYDRODIURIL ) 12.5 MG tablet TAKE 1 TABLET BY MOUTH EVERY DAY   Multiple Vitamins-Minerals (HAIR SKIN & NAILS PO) Take by mouth.   omeprazole  (PRILOSEC) 40 MG capsule TAKE  1 CAPSULE (40 MG TOTAL) BY MOUTH DAILY.   OVER THE COUNTER MEDICATION Doterra-EO Mega/AlphaCRS/MIcro Plex VM/Deep Blue Veg capsules   potassium chloride  SA (KLOR-CON  M) 20 MEQ tablet Take 1 tab po daily   rosuvastatin  (CRESTOR ) 20 MG tablet TAKE 1 TABLET BY MOUTH EVERY DAY   triamcinolone  cream (KENALOG ) 0.1 % Apply 1 Application topically 2 (two) times daily.   [DISCONTINUED] Zoster Vaccine Adjuvanted [facility]) injection Inject 0.5 mLs into the muscle once.   Zoster Vaccine Adjuvanted [facility]) injection Inject 0.5 mLs into the muscle once for 1 dose.   No facility-administered encounter medications on file as of 10/05/2024.    Surgical History: Past Surgical History:  Procedure Laterality Date   ABDOMINAL HYSTERECTOMY     COLONOSCOPY WITH PROPOFOL  N/A 12/12/2015   Procedure: COLONOSCOPY WITH PROPOFOL ;  Surgeon: Lamar Cynthia Holmes, MD;  Location: [facility] ENDOSCOPY;   Service: Endoscopy;  Laterality: N/A;   HAND SURGERY  2005    Medical History: Past Medical History:  Diagnosis Date   Actinic keratosis 03/15/2021   L post thigh    Basal cell carcinoma 06/21/2021   L alar crease, EDC done 07/31/21   GERD (gastroesophageal reflux disease)    Hyperlipidemia    Hypertension    Malignant essential hypertension 02/26/2017   Plantar fasciitis    Sleep apnea    Urinary tract infection     Family History: Family History  Problem Relation Age of Onset   Breast cancer Cousin    Stroke Mother    Heart disease Paternal Grandmother    Cancer Paternal Grandfather    Stroke Sister    Stroke Brother     Social History   Socioeconomic History   Marital status: Married    Spouse name: Not on file   Number of children: Not on file   Years of education: Not on file   Highest education level: Not on file  Occupational History   Not on file  Tobacco Use   Smoking status: Never   Smokeless tobacco: Never  Vaping Use   Vaping status: Never Used  Substance and Sexual Activity   Alcohol use: Not Currently    Comment: ocassionally   Drug use: No   Sexual activity: Yes    Birth control/protection: Post-menopausal, Surgical  Other Topics Concern   Not on file  Social History Narrative   Not on file   Social Drivers of Health   Financial Resource Strain: Not on file  Food Insecurity: No Food Insecurity (10/20/2022)   Hunger Vital Sign    Worried About Running Out of Food in the Last Year: Never true    Ran Out of Food in the Last Year: Never true  Transportation Needs: No Transportation Needs (10/20/2022)   PRAPARE - Administrator, Civil Service (Medical): No    Lack of Transportation (Non-Medical): No  Physical Activity: Not on file  Stress: Not on file  Social Connections: Not on file  Intimate Partner Violence: Not At Risk (10/20/2022)   Humiliation, Afraid, Rape, and Kick questionnaire    Fear of Current or Ex-Partner: No     Emotionally Abused: No    Physically Abused: No    Sexually Abused: No      Review of Systems  Constitutional:  Negative for chills and unexpected weight change.  HENT:  Negative for congestion, postnasal drip, rhinorrhea, sneezing and sore throat.   Eyes:  Negative for redness.  Respiratory:  Negative for chest tightness, shortness of breath  and wheezing.   Cardiovascular:  Negative for chest pain and palpitations.  Gastrointestinal:  Negative for abdominal pain, constipation, diarrhea, nausea and vomiting.  Genitourinary:  Negative for dysuria and frequency.  Musculoskeletal:  Negative for arthralgias, joint swelling and neck pain.  Skin:  Negative for rash.  Neurological: Negative.  Negative for tremors and numbness.  Hematological:  Negative for adenopathy. Does not bruise/bleed easily.  Psychiatric/Behavioral:  Negative for behavioral problems (Depression), sleep disturbance and suicidal ideas. The patient is not nervous/anxious.        Memory loss    Vital Signs: BP 130/70   Pulse 66   Temp 97.8 F (36.6 C)   Resp 16   Ht 4' 8.5 (1.435 m)   Wt 145 lb (65.8 kg)   SpO2 97%   BMI 31.94 kg/m    Physical Exam Vitals and nursing note reviewed.  Constitutional:      Appearance: Normal appearance.  HENT:     Head: Normocephalic and atraumatic.  Eyes:     Extraocular Movements: Extraocular movements intact.  Cardiovascular:     Rate and Rhythm: Normal rate and regular rhythm.  Pulmonary:     Effort: Pulmonary effort is normal.     Breath sounds: Normal breath sounds.  Skin:    General: Skin is warm and dry.  Neurological:     Mental Status: She is alert and oriented to person, place, and time.     Gait: Gait normal.     Comments: Pt repeats herself in office and requires repeat instructions  Psychiatric:        Mood and Affect: Mood normal.        Behavior: Behavior normal.        Assessment/Plan: 1. Encounter for annual wellness exam in Medicare patient  (Primary) AWV performed, will update bone density scan  2. Essential hypertension Stable, continue current medications  3. Low serum potassium Resolved, continue supplement as before  4. Elevated parathyroid  hormone Resolved  5. Age related osteoporosis, unspecified pathological fracture presence Will updated bone density - DG Bone Density; Future  6. Need for shingles vaccine - Zoster Vaccine Adjuvanted [facility]) injection; Inject 0.5 mLs into the muscle once for 1 dose.  Dispense: 0.5 mL; Refill: 0     General Counseling: Reene verbalizes understanding of the findings of todays visit and agrees with plan of treatment. I have discussed any further diagnostic evaluation that may be needed or ordered today. We also reviewed her medications today. she has been encouraged to call the office with any questions or concerns that should arise related to todays visit.    Orders Placed This Encounter  Procedures   DG Bone Density    Meds ordered this encounter  Medications   Zoster Vaccine Adjuvanted [facility]) injection    Sig: Inject 0.5 mLs into the muscle once for 1 dose.    Dispense:  0.5 mL    Refill:  0    Return for after test is done.   Total time spent:35 Minutes Time spent includes review of chart, medications, test results, and follow up plan with the patient.    Controlled Substance Database was reviewed by me.  This patient was seen by Tinnie Pro, PA-C in collaboration with Dr. Sigrid Bathe as a part of collaborative care agreement.  Tinnie Pro, PA-C Internal medicine

## 2024-10-14 ENCOUNTER — Other Ambulatory Visit: Payer: Self-pay | Admitting: Physician Assistant

## 2024-10-14 DIAGNOSIS — I10 Essential (primary) hypertension: Secondary | ICD-10-CM

## 2024-10-15 ENCOUNTER — Emergency Department

## 2024-10-15 DIAGNOSIS — S86912A Strain of unspecified muscle(s) and tendon(s) at lower leg level, left leg, initial encounter: Secondary | ICD-10-CM | POA: Diagnosis not present

## 2024-10-15 DIAGNOSIS — X501XXA Overexertion from prolonged static or awkward postures, initial encounter: Secondary | ICD-10-CM | POA: Insufficient documentation

## 2024-10-15 DIAGNOSIS — B86 Scabies: Secondary | ICD-10-CM | POA: Insufficient documentation

## 2024-10-15 DIAGNOSIS — Z85828 Personal history of other malignant neoplasm of skin: Secondary | ICD-10-CM | POA: Insufficient documentation

## 2024-10-15 DIAGNOSIS — M79605 Pain in left leg: Secondary | ICD-10-CM | POA: Insufficient documentation

## 2024-10-15 DIAGNOSIS — I10 Essential (primary) hypertension: Secondary | ICD-10-CM | POA: Insufficient documentation

## 2024-10-15 NOTE — ED Triage Notes (Signed)
 Pt arrived to the ED d/t left leg pain starting yesterday and worsened today. 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 No redness or swelling noted in triage

## 2024-10-16 ENCOUNTER — Emergency Department
Admission: EM | Admit: 2024-10-16 | Discharge: 2024-10-16 | Disposition: A | Discharge status: Home / Self Care | Attending: Emergency Medicine | Admitting: Emergency Medicine

## 2024-10-16 DIAGNOSIS — T148XXA Other injury of unspecified body region, initial encounter: Secondary | ICD-10-CM

## 2024-10-16 DIAGNOSIS — B86 Scabies: Secondary | ICD-10-CM

## 2024-10-16 MED ORDER — NAPROXEN 250 MG PO TABS: 250.0000 mg | ORAL_TABLET | Freq: Two times a day (BID) | ORAL | 0 refills | Status: AC

## 2024-10-16 MED ORDER — NAPROXEN 500 MG PO TABS
250.0000 mg | ORAL_TABLET | Freq: Once | ORAL | Status: AC
  Administered 2024-10-16: 250 mg via ORAL
  Filled 2024-10-16: qty 1

## 2024-10-16 MED ORDER — PERMETHRIN 5 % EX CREA: TOPICAL_CREAM | CUTANEOUS | 1 refills | Status: AC

## 2024-10-16 NOTE — Discharge Instructions (Addendum)
 Take naproxen 250mg  twice daily.  Use a heating pad on the left thigh morning and night.   Apply Elimite cream according to instructions for the left leg rash.

## 2024-10-16 NOTE — ED Provider Notes (Signed)
 [facility] Provider Note    Event Date/Time   First MD Initiated Contact with Patient 10/16/24 [phone]     (approximate)   History   Chief Complaint: Leg Pain   HPI  Cynthia Keith is a 78 y.o. female with a history of GERD, hypertension who comes ED complaining of left leg pain in the thigh since yesterday.  Gradual onset, worse with standing and moving.  No chest pain or shortness of breath, no leg swelling.  No fever.  Does report that she was recently diagnosed with sciatica due to low back pain.        Past Medical History:  Diagnosis Date   Actinic keratosis 03/15/2021   L post thigh    Basal cell carcinoma 06/21/2021   L alar crease, EDC done 07/31/21   GERD (gastroesophageal reflux disease)    Hyperlipidemia    Hypertension    Malignant essential hypertension 02/26/2017   Plantar fasciitis    Sleep apnea    Urinary tract infection     Current Outpatient Rx   Order #: [id number] Class: Normal   Order #: [id number] Class: Normal   Order #: [id number] Class: Normal   Order #: [id number] Class: Historical Med   Order #: [id number] Class: Historical Med   Order #: [id number] Class: Historical Med   Order #: [id number] Class: Normal   Order #: [id number] Class: Normal   Order #: [id number] Class: Normal   Order #: [id number] Class: Historical Med   Order #: [id number] Class: Normal   Order #: [id number] Class: Historical Med   Order #: [id number] Class: Normal   Order #: [id number] Class: Normal   Order #: [id number] Class: Normal    Past Surgical History:  Procedure Laterality Date   ABDOMINAL HYSTERECTOMY     COLONOSCOPY WITH PROPOFOL  N/A 12/12/2015   Procedure: COLONOSCOPY WITH PROPOFOL ;  Surgeon: Lamar ONEIDA Holmes, MD;  Location: [facility] ENDOSCOPY;  Service: Endoscopy;  Laterality: N/A;   HAND SURGERY  2005    Physical Exam   Triage Vital Signs: ED Triage Vitals [10/15/24 2222]  Encounter Vitals Group     BP      Girls Systolic BP Percentile      Girls  Diastolic BP Percentile      Boys Systolic BP Percentile      Boys Diastolic BP Percentile      Pulse      Resp      Temp      Temp src      SpO2      Weight 143 lb (64.9 kg)     Height 4' 9 (1.448 m)     Head Circumference      Peak Flow      Pain Score 8     Pain Loc      Pain Education      Exclude from Growth Chart     Most recent vital signs: There were no vitals filed for this visit.  General: Awake, no distress.  CV:  Good peripheral perfusion.  Regular rate rhythm, normal distal pulses Resp:  Normal effort.  Abd:  No distention.  Soft nontender Other:  Symmetric calf circumference, no calf tenderness.  There is tenderness along the proximal quadriceps.  Hip flexion against resistance reproduces her pain.  Separately, there is a rash on the left lower leg that patient describes as very itchy, appearance consistent with scabies.   ED Results / Procedures / Treatments   Labs (all labs ordered are listed, but only abnormal results are displayed) Labs Reviewed -  No data to display   EKG    RADIOLOGY Ultrasound left lower extremity negative for DVT   PROCEDURES:  Procedures   MEDICATIONS ORDERED IN ED: Medications  naproxen (NAPROSYN) tablet 250 mg (has no administration in time range)     IMPRESSION / MDM / ASSESSMENT AND PLAN / ED COURSE  I reviewed the triage vital signs and the nursing notes.  DDx: Quadricep strain, meralgia paresthetica, DVT.  Doubt infection, rhabdomyolysis  Patient's presentation is most consistent with acute presentation with potential threat to life or bodily function.  Patient presents with left upper leg pain, reproducible on exam, there is suspicious for muscular origin.  Ultrasound negative.  Recommend supportive care at home.  Elimite for scabies       FINAL CLINICAL IMPRESSION(S) / ED DIAGNOSES   Final diagnoses:  Muscle strain  Scabies     Rx / DC Orders   ED Discharge Orders          Ordered     naproxen (NAPROSYN) 250 MG tablet  2 times daily with meals        10/16/24 0146    permethrin (ELIMITE) 5 % cream        10/16/24 0146             Note:  This document was prepared using Dragon voice recognition software and may include unintentional dictation errors.   Viviann Pastor, MD 10/16/24 [phone]

## 2024-10-19 ENCOUNTER — Encounter: Payer: Self-pay | Admitting: Dermatology

## 2024-10-19 ENCOUNTER — Ambulatory Visit: Payer: Medicare HMO | Admitting: Dermatology

## 2024-10-19 DIAGNOSIS — L578 Other skin changes due to chronic exposure to nonionizing radiation: Secondary | ICD-10-CM | POA: Diagnosis not present

## 2024-10-19 DIAGNOSIS — L820 Inflamed seborrheic keratosis: Secondary | ICD-10-CM | POA: Diagnosis not present

## 2024-10-19 DIAGNOSIS — L814 Other melanin hyperpigmentation: Secondary | ICD-10-CM | POA: Diagnosis not present

## 2024-10-19 DIAGNOSIS — L308 Other specified dermatitis: Secondary | ICD-10-CM | POA: Diagnosis not present

## 2024-10-19 DIAGNOSIS — Z85828 Personal history of other malignant neoplasm of skin: Secondary | ICD-10-CM

## 2024-10-19 DIAGNOSIS — L0100 Impetigo, unspecified: Secondary | ICD-10-CM

## 2024-10-19 DIAGNOSIS — W908XXA Exposure to other nonionizing radiation, initial encounter: Secondary | ICD-10-CM | POA: Diagnosis not present

## 2024-10-19 DIAGNOSIS — D2272 Melanocytic nevi of left lower limb, including hip: Secondary | ICD-10-CM | POA: Diagnosis not present

## 2024-10-19 DIAGNOSIS — L821 Other seborrheic keratosis: Secondary | ICD-10-CM

## 2024-10-19 DIAGNOSIS — Z1283 Encounter for screening for malignant neoplasm of skin: Secondary | ICD-10-CM | POA: Diagnosis not present

## 2024-10-19 DIAGNOSIS — L309 Dermatitis, unspecified: Secondary | ICD-10-CM

## 2024-10-19 DIAGNOSIS — D1801 Hemangioma of skin and subcutaneous tissue: Secondary | ICD-10-CM

## 2024-10-19 DIAGNOSIS — D229 Melanocytic nevi, unspecified: Secondary | ICD-10-CM

## 2024-10-19 MED ORDER — MUPIROCIN 2 % EX OINT: 1.0000 | TOPICAL_OINTMENT | Freq: Two times a day (BID) | CUTANEOUS | 1 refills | Status: AC

## 2024-10-19 MED ORDER — CLOBETASOL PROPIONATE 0.05 % EX CREA: 1.0000 | TOPICAL_CREAM | Freq: Two times a day (BID) | CUTANEOUS | 0 refills | Status: AC

## 2024-10-19 NOTE — Patient Instructions (Addendum)
 Cryotherapy Aftercare  Wash gently with soap and water everyday.   Apply Vaseline and Band-Aid daily until healed.    Start Clobetasol  cream twice a day to rash on left lower leg and left upper arm until clear, may use up to 4 weeks, avoid applying to face, underarms and in groin Start Mupirocin ointment twice a day to rash on L lower leg, apply the Clobetasol  first then the Mupirocin ointment   Due to recent changes in healthcare laws, you may see results of your pathology and/or laboratory studies on MyChart before the doctors have had a chance to review them. We understand that in some cases there may be results that are confusing or concerning to you. Please understand that not all results are received at the same time and often the doctors may need to interpret multiple results in order to provide you with the best plan of care or course of treatment. Therefore, we ask that you please give us  2 business days to thoroughly review all your results before contacting the office for clarification. Should we see a critical lab result, you will be contacted sooner.   If You Need Anything After Your Visit  If you have any questions or concerns for your doctor, please call our main line at [phone] and press option 4 to reach your doctor's medical assistant. If no one answers, please leave a voicemail as directed and we will return your call as soon as possible. Messages left after 4 pm will be answered the following business day.   You may also send us  a message via MyChart. We typically respond to MyChart messages within 1-2 business days.  For prescription refills, please ask your pharmacy to contact our office. Our fax number is [phone].  If you have an urgent issue when the clinic is closed that cannot wait until the next business day, you can page your doctor at the number below.    Please note that while we do our best to be available for urgent issues outside of office hours, we  are not available 24/7.   If you have an urgent issue and are unable to reach us , you may choose to seek medical care at your doctor's office, retail clinic, urgent care center, or emergency room.  If you have a medical emergency, please immediately call 911 or go to the emergency department.  Pager Numbers  - Dr. Hester: [phone]  - Dr. Jackquline: [phone]  - Dr. Claudene: [phone]   - Dr. Raymund: [phone]  In the event of inclement weather, please call our main line at [phone] for an update on the status of any delays or closures.  Dermatology Medication Tips: Please keep the boxes that topical medications come in in order to help keep track of the instructions about where and how to use these. Pharmacies typically print the medication instructions only on the boxes and not directly on the medication tubes.   If your medication is too expensive, please contact our office at [phone] option 4 or send us  a message through MyChart.   We are unable to tell what your co-pay for medications will be in advance as this is different depending on your insurance coverage. However, we may be able to find a substitute medication at lower cost or fill out paperwork to get insurance to cover a needed medication.   If a prior authorization is required to get your medication covered by your insurance company, please allow us  1-2 business days to complete  this process.  Drug prices often vary depending on where the prescription is filled and some pharmacies may offer cheaper prices.  The website www.goodrx.com contains coupons for medications through different pharmacies. The prices here do not account for what the cost may be with help from insurance (it may be cheaper with your insurance), but the website can give you the price if you did not use any insurance.  - You can print the associated coupon and take it with your prescription to the pharmacy.  - You may also stop by our  office during regular business hours and pick up a GoodRx coupon card.  - If you need your prescription sent electronically to a different pharmacy, notify our office through [facility] or by phone at [phone] option 4.     Si Usted Necesita Algo Despus de Su Visita  Tambin puede enviarnos un mensaje a travs de Clinical Cytogeneticist. Por lo general respondemos a los mensajes de MyChart en el transcurso de 1 a 2 das hbiles.  Para renovar recetas, por favor pida a su farmacia que se ponga en contacto con nuestra oficina. Randi lakes de fax es [place] [phone].  Si tiene un asunto urgente cuando la clnica est cerrada y que no puede esperar hasta el siguiente da hbil, puede llamar/localizar a su doctor(a) al nmero que aparece a continuacin.   Por favor, tenga en cuenta que aunque hacemos todo lo posible para estar disponibles para asuntos urgentes fuera del horario de [place], no estamos disponibles las 24 horas del da, los 7 [address]  [address] de la [place].   Si tiene un problema urgente y no puede comunicarse con nosotros, puede optar por buscar atencin mdica  en el consultorio de su doctor(a), en una clnica privada, en un centro de atencin urgente o en una sala de emergencias.  Si tiene engineer, drilling, por favor llame inmediatamente al 911 o vaya a la sala de emergencias.  Nmeros de bper  - Dr. Hester: [phone]  - Dra. Jackquline: [phone]  - Dr. Claudene: [phone]  - Dra. Kitts: [phone]  En caso de inclemencias del [place], por favor llame a nuestra lnea principal al [phone] para una actualizacin sobre el estado de cualquier retraso o cierre.  Consejos para la medicacin en dermatologa: Por favor, guarde las cajas en las que vienen los medicamentos de uso tpico para ayudarle a seguir las instrucciones sobre dnde y cmo usarlos. Las farmacias generalmente imprimen las instrucciones del medicamento slo en las cajas y no directamente en los tubos del  [place].   Si su medicamento es muy caro, por favor, pngase en contacto con landry rieger llamando al [phone] y presione la opcin 4 o envenos un mensaje a travs de Clinical Cytogeneticist.   No podemos decirle cul ser su copago por los medicamentos por adelantado ya que esto es diferente dependiendo de la cobertura de su seguro. Sin embargo, es posible que podamos encontrar un medicamento sustituto a audiological scientist un formulario para que el seguro cubra el medicamento que se considera necesario.   Si se requiere una autorizacin previa para que su compaa de seguros cubra su medicamento, por favor permtanos de 1 a 2 das hbiles para completar este proceso.  Los precios de los medicamentos varan con frecuencia dependiendo del environmental consultant de dnde se surte la receta y alguna farmacias pueden ofrecer precios ms baratos.  El sitio web www.goodrx.com tiene cupones para medicamentos de health and safety inspector. Los precios aqu no tienen en cuenta lo que podra costar  con la ayuda del seguro (puede ser ms barato con su seguro), pero el sitio web puede darle el precio si no visual merchandiser.  - Puede imprimir el cupn correspondiente y llevarlo con su receta a la farmacia.  - Tambin puede pasar por nuestra oficina durante el horario de atencin regular y education officer, museum una tarjeta de cupones de GoodRx.  - Si necesita que su receta se enve electrnicamente a una farmacia diferente, informe a nuestra oficina a travs de MyChart de [place] o por telfono llamando al [phone] y presione la opcin 4.

## 2024-10-19 NOTE — Progress Notes (Signed)
 Follow-Up Visit   Subjective  Cynthia Keith is a 78 y.o. female who presents for the following: Skin Cancer Screening and Full Body Skin Exam hx of BCC, lower legs itching, otc Deep Blue lotion.  She has a h/o eczema on her legs in the past treated with clobetasol  cream.  The patient presents for Total-Body Skin Exam (TBSE) for skin cancer screening and mole check. The patient has spots, moles and lesions to be evaluated, some may be new or changing and the patient may have concern these could be cancer.  Spot at hairline is getting bigger and gets irritated.   The following portions of the chart were reviewed this encounter and updated as appropriate: medications, allergies, medical history  Review of Systems:  No other skin or systemic complaints except as noted in HPI or Assessment and Plan.  Objective  Well appearing patient in no apparent distress; mood and affect are within normal limits.  A full examination was performed including scalp, head, eyes, ears, nose, lips, neck, chest, axillae, abdomen, back, buttocks, bilateral upper extremities, bilateral lower extremities, hands, feet, fingers, toes, fingernails, and toenails. All findings within normal limits unless otherwise noted below.   Relevant physical exam findings are noted in the Assessment and Plan.  frontal hairline x 1 Stuck on waxy papule with erythema  Assessment & Plan   SKIN CANCER SCREENING PERFORMED TODAY.  ACTINIC DAMAGE - Chronic condition, secondary to cumulative UV/sun exposure - diffuse scaly erythematous macules with underlying dyspigmentation - Recommend daily broad spectrum sunscreen SPF 30+ to sun-exposed areas, reapply every 2 hours as needed.  - Staying in the shade or wearing long sleeves, sun glasses (UVA+UVB protection) and wide brim hats (4-inch brim around the entire circumference of the hat) are also recommended for sun protection.  - Call for new or changing lesions.  LENTIGINES,  SEBORRHEIC KERATOSES, HEMANGIOMAS - Benign normal skin lesions - Benign-appearing - Call for any changes  MELANOCYTIC NEVI - Tan-brown and/or pink-flesh-colored symmetric macules and papules - Benign appearing on exam today - Observation - Call clinic for new or changing moles - Recommend daily use of broad spectrum spf 30+ sunscreen to sun-exposed areas.   HISTORY OF BASAL CELL CARCINOMA OF THE SKIN - No evidence of recurrence today- L alar Crease  - Recommend regular full body skin exams - Recommend daily broad spectrum sunscreen SPF 30+ to sun-exposed areas, reapply every 2 hours as needed.  - Call if any new or changing lesions are noted between office visits    Nevus Spilus L post thigh - Brown macules or papules within lighter tan patch - Genetic - Benign, observe - Call for any changes   Dermatitis with Secondary Impetiginization at L lower leg L upper arm, L lower leg Exam: Pink scaly patch L upper arm, L lower leg with pink patch with excoriations and yellow ooze/crust  Chronic and persistent condition with duration or expected duration over one year. Condition is bothersome/symptomatic for patient. Currently flared.   Atopic dermatitis (eczema) is a chronic, relapsing, pruritic condition that can significantly affect quality of life. It is often associated with allergic rhinitis and/or asthma and can require treatment with topical medications, phototherapy, or in severe cases biologic injectable medication (Dupixent, Adbry, Ebglyss) or oral JAK inhibitors (Rinvoq, Cibinqo).    Treatment Plan: D/C Deep Blue oil Start Clobetasol  cr bid up to 4 weeks aa L arm and L lower leg, avoid f/g/a, Caution skin atrophy with long-term use.  Start Mupirocin oint  bid to L lower leg  RTC if not improved/clear in 1 mo.  Topical steroids (such as triamcinolone , fluocinolone, fluocinonide, mometasone, clobetasol , halobetasol, betamethasone, hydrocortisone) can cause thinning and  lightening of the skin if they are used for too long in the same area. Your physician has selected the right strength medicine for your problem and area affected on the body. Please use your medication only as directed by your physician to prevent side effects.   INFLAMED SEBORRHEIC KERATOSIS frontal hairline x 1 Symptomatic, irritating, patient would like treated. Destruction of lesion - frontal hairline x 1  Destruction method: cryotherapy   Informed consent: discussed and consent obtained   Lesion destroyed using liquid nitrogen: Yes   Region frozen until ice ball extended beyond lesion: Yes   Outcome: patient tolerated procedure well with no complications   Post-procedure details: wound care instructions given   Additional details:  Prior to procedure, discussed risks of blister formation, small wound, skin dyspigmentation, or rare scar following cryotherapy. Recommend Vaseline ointment to treated areas while healing.   Return in about 1 year (around 10/19/2025) for TBSE, Hx of BCC.  I, [name], RMA, am acting as scribe for [name], MD .   Documentation: I have reviewed the above documentation for accuracy and completeness, and I agree with the above.  [name], MD

## 2024-10-24 ENCOUNTER — Other Ambulatory Visit: Payer: Self-pay | Admitting: Physician Assistant

## 2024-10-24 DIAGNOSIS — E876 Hypokalemia: Secondary | ICD-10-CM

## 2024-11-02 ENCOUNTER — Other Ambulatory Visit

## 2024-11-08 ENCOUNTER — Other Ambulatory Visit: Payer: Self-pay | Admitting: Physician Assistant

## 2024-11-08 DIAGNOSIS — G609 Hereditary and idiopathic neuropathy, unspecified: Secondary | ICD-10-CM

## 2024-11-16 ENCOUNTER — Ambulatory Visit: Admitting: Physician Assistant

## 2024-11-21 ENCOUNTER — Emergency Department
Admission: EM | Admit: 2024-11-21 | Discharge: 2024-11-21 | Disposition: A | Discharge status: Home / Self Care | Attending: Emergency Medicine | Admitting: Emergency Medicine

## 2024-11-21 ENCOUNTER — Other Ambulatory Visit: Payer: Self-pay

## 2024-11-21 ENCOUNTER — Emergency Department

## 2024-11-21 DIAGNOSIS — R079 Chest pain, unspecified: Secondary | ICD-10-CM | POA: Diagnosis not present

## 2024-11-21 DIAGNOSIS — M25511 Pain in right shoulder: Secondary | ICD-10-CM | POA: Insufficient documentation

## 2024-11-21 DIAGNOSIS — D72829 Elevated white blood cell count, unspecified: Secondary | ICD-10-CM | POA: Insufficient documentation

## 2024-11-21 DIAGNOSIS — I10 Essential (primary) hypertension: Secondary | ICD-10-CM | POA: Insufficient documentation

## 2024-11-21 DIAGNOSIS — R0789 Other chest pain: Secondary | ICD-10-CM | POA: Diagnosis not present

## 2024-11-21 LAB — CBC
HCT: 42.4 % (ref 36.0–46.0)
Hemoglobin: 14.5 g/dL (ref 12.0–15.0)
MCH: 31.4 pg (ref 26.0–34.0)
MCHC: 34.2 g/dL (ref 30.0–36.0)
MCV: 91.8 fL (ref 80.0–100.0)
Platelets: 270 K/uL (ref 150–400)
RBC: 4.62 MIL/uL (ref 3.87–5.11)
RDW: 13 % (ref 11.5–15.5)
WBC: 13.8 K/uL — ABNORMAL HIGH (ref 4.0–10.5)
nRBC: 0 % (ref 0.0–0.2)

## 2024-11-21 LAB — BASIC METABOLIC PANEL WITH GFR
Anion gap: 12 (ref 5–15)
BUN: 10 mg/dL (ref 8–23)
CO2: 25 mmol/L (ref 22–32)
Calcium: 9.9 mg/dL (ref 8.9–10.3)
Chloride: 101 mmol/L (ref 98–111)
Creatinine, Ser: 0.73 mg/dL (ref 0.44–1.00)
GFR, Estimated: >60 mL/min (ref >60)
Glucose, Bld: 161 mg/dL — ABNORMAL HIGH (ref 70–99)
Potassium: 4.3 mmol/L (ref 3.5–5.1)
Sodium: 138 mmol/L (ref 135–145)

## 2024-11-21 LAB — TROPONIN T, HIGH SENSITIVITY: Troponin T High Sensitivity: <15 ng/L (ref 0–19)

## 2024-11-21 MED ORDER — HYDROCODONE-ACETAMINOPHEN 5-325 MG PO TABS: 1.0000 | ORAL_TABLET | ORAL | 0 refills | Status: AC | PRN

## 2024-11-21 MED ORDER — OXYCODONE-ACETAMINOPHEN 5-325 MG PO TABS
1.0000 | ORAL_TABLET | Freq: Once | ORAL | Status: AC
  Administered 2024-11-21: 1 via ORAL
  Filled 2024-11-21: qty 1

## 2024-11-21 NOTE — ED Triage Notes (Signed)
 Pt to ED via POV from home. Pt reports started having right side CP that radiates to back that started this am. Pt reports pain has been constant and is 10/10. Pt denies blood thinner. Pt denies falls/injuries.

## 2024-11-21 NOTE — ED Provider Notes (Signed)
 [facility] Provider Note    Event Date/Time   First MD Initiated Contact with Patient 11/21/24 1006     (approximate)  History   Chief Complaint: Chest Pain  HPI  Cynthia Keith is a 78 y.o. female with a past medical history of hypertension, hyperlipidemia, presents emergency department for right shoulder pain.  According to the patient for the last 5 to 6 days she has been experiencing some pain in the right shoulder.  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  Patient states this started on Monday and seem to be getting worse with movement.  However she states this morning it began hurting more significantly she was concerned so she came to the emergency department for evaluation.  Patient denies any pain in the chest but does state pain in the right shoulder that seems to go to the right upper back, worse with movement of the right arm.  No nausea no shortness of breath no pleuritic pain.  Physical Exam   Triage Vital Signs: ED Triage Vitals  Encounter Vitals Group     BP 11/21/24 0957 (!) 152/65     Girls Systolic BP Percentile --      Girls Diastolic BP Percentile --      Boys Systolic BP Percentile --      Boys Diastolic BP Percentile --      Pulse Rate 11/21/24 0956 (!) 57     Resp 11/21/24 0956 18     Temp 11/21/24 0956 97.9 F (36.6 C)     Temp Source 11/21/24 0956 Oral     SpO2 11/21/24 0956 98 %     Weight --      Height --      Head Circumference --      Peak Flow --      Pain Score 11/21/24 0956 10     Pain Loc --      Pain Education --      Exclude from Growth Chart --     Most recent vital signs: Vitals:   11/21/24 0956 11/21/24 0957  BP:  (!) 152/65  Pulse: (!) 57   Resp: 18   Temp: 97.9 F (36.6 C)   SpO2: 98%     General: Awake, no distress.  CV:  Good peripheral perfusion.  Regular rate and rhythm  Resp:  Normal effort.  Equal breath sounds bilaterally.  Abd:  No distention.  Soft,  nontender.  No rebound or guarding. Other:  Patient has mild tenderness to palpation of the right shoulder more moderate discomfort with range of motion of the right shoulder.  Good range of motion however no concern for fracture or dislocation.  Neurovascularly intact distally.   ED Results / Procedures / Treatments   EKG  EKG viewed and interpreted by myself shows a normal sinus rhythm at 62 bpm with a narrow QRS, left axis deviation, largely normal intervals, no concerning ST changes.  RADIOLOGY  I have reviewed interpret the chest x-ray images.  No consolidation seen on my evaluation.   MEDICATIONS ORDERED IN ED: Medications  oxyCODONE -acetaminophen  (PERCOCET/ROXICET) 5-325 MG per tablet 1 tablet (has no administration in time range)     IMPRESSION / MDM / ASSESSMENT AND PLAN / ED COURSE  I reviewed the triage vital signs and the nursing notes.  Patient's presentation is most consistent with acute presentation with potential threat to life or bodily function.  Patient presents to the  emergency department for right shoulder pain.  Overall the patient appears well, no distress.  Reassuring exam.  The pain occurs mostly with range of motion of the right upper extremity.  Given the patient's age and symptoms we will obtain labs including cardiac enzymes.  Will obtain a chest x-ray.  EKG does not appear to show any significant findings.  We will dose 1 Percocet for pain control while awaiting further results.  Patient agreeable to plan.  No distress well-appearing.  Patient's workup shows a reassuring CBC slight leukocytosis at 13,000, reassuring chemistry including normal renal function.  Reassuringly negative troponin.  Chest x-ray is negative and EKG reassuring.  Patient states her symptoms are feeling better after pain medication.  Given her negative troponin highly suspect more of a musculoskeletal issue as the pain is exacerbated with movement of the shoulder.  FINAL CLINICAL  IMPRESSION(S) / ED DIAGNOSES   Right shoulder pain    Note:  This document was prepared using Dragon voice recognition software and may include unintentional dictation errors.   [name], MD 11/21/24 1154

## 2024-11-21 NOTE — Discharge Instructions (Addendum)
 As we discussed please take your pain medication as needed but only as prescribed.  Please follow-up with your primary care doctor on Monday.  Return to the emergency department for any worsening pain especially if the pain moves into the chest or you feel short of breath nauseous or gets sweaty.

## 2024-11-23 ENCOUNTER — Ambulatory Visit: Admitting: Physician Assistant

## 2024-12-04 ENCOUNTER — Other Ambulatory Visit: Payer: Self-pay | Admitting: Cardiovascular Disease

## 2024-12-04 ENCOUNTER — Other Ambulatory Visit: Payer: Self-pay | Admitting: Physician Assistant

## 2024-12-04 DIAGNOSIS — E876 Hypokalemia: Secondary | ICD-10-CM

## 2024-12-04 DIAGNOSIS — E782 Mixed hyperlipidemia: Secondary | ICD-10-CM

## 2024-12-27 ENCOUNTER — Other Ambulatory Visit: Payer: Self-pay | Admitting: Physician Assistant

## 2024-12-27 DIAGNOSIS — I10 Essential (primary) hypertension: Secondary | ICD-10-CM

## 2024-12-27 DIAGNOSIS — K219 Gastro-esophageal reflux disease without esophagitis: Secondary | ICD-10-CM

## 2024-12-28 ENCOUNTER — Ambulatory Visit
Admission: RE | Admit: 2024-12-28 | Discharge: 2024-12-28 | Disposition: A | Discharge status: Home / Self Care | Source: Ambulatory Visit | Attending: Physician Assistant | Admitting: Physician Assistant

## 2024-12-28 ENCOUNTER — Encounter: Payer: Self-pay | Admitting: *Deleted

## 2024-12-28 DIAGNOSIS — M810 Age-related osteoporosis without current pathological fracture: Secondary | ICD-10-CM | POA: Diagnosis present

## 2025-01-05 ENCOUNTER — Ambulatory Visit: Attending: Cardiovascular Disease | Admitting: Cardiovascular Disease

## 2025-01-05 VITALS — BP 142/60 | HR 64 | Ht <= 58 in | Wt 146.2 lb

## 2025-01-05 DIAGNOSIS — I10 Essential (primary) hypertension: Secondary | ICD-10-CM

## 2025-01-05 DIAGNOSIS — I700 Atherosclerosis of aorta: Secondary | ICD-10-CM | POA: Diagnosis not present

## 2025-01-05 DIAGNOSIS — I2510 Atherosclerotic heart disease of native coronary artery without angina pectoris: Secondary | ICD-10-CM | POA: Diagnosis not present

## 2025-01-05 DIAGNOSIS — E785 Hyperlipidemia, unspecified: Secondary | ICD-10-CM | POA: Diagnosis not present

## 2025-01-05 NOTE — Patient Instructions (Signed)

## 2025-01-05 NOTE — Progress Notes (Signed)
 "    Cardiology Office Note   Date:  01/05/2025   ID:  Cynthia Keith, DOB 05-08-1946, MRN [id number]  PCP:  Kristina Tinnie POUR, PA-C  Cardiologist:   Deatrice Cage, MD   Chief Complaint  Patient presents with   Follow-up      History of Present Illness: Cynthia Keith is a 79 y.o. female who is here today for a follow-up visit regarding coronary artery disease and aortic atherosclerosis. She has known history of essential hypertension, hyperlipidemia, sleep apnea and GERD. She was seen in 2024 for coronary artery calcifications and aortic atherosclerosis noted on CT imaging.   Abdominal ultrasound 2021 showed no evidence of aortic aneurysm. She was hospitalized in October 2023 for atypical chest pain.  Troponin normal.  Echocardiogram showed normal LV systolic function with mild mitral and tricuspid regurgitation. She has history of mild memory decline.   She had atypical chest pain and was evaluated by a cardiac PET scan in January 2025 which showed normal perfusion and normal ejection fraction. She has been doing well with no chest pain, shortness of breath or palpitations.  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  Past Medical History:  Diagnosis Date   Actinic keratosis 03/15/2021   L post thigh    Basal cell carcinoma 06/21/2021   L alar crease, EDC done 07/31/21   GERD (gastroesophageal reflux disease)    Hyperlipidemia    Hypertension    Malignant essential hypertension 02/26/2017   Plantar fasciitis    Sleep apnea    Urinary tract infection     Past Surgical History:  Procedure Laterality Date   ABDOMINAL HYSTERECTOMY     COLONOSCOPY WITH PROPOFOL  N/A 12/12/2015   Procedure: COLONOSCOPY WITH PROPOFOL ;  Surgeon: Lamar ONEIDA Holmes, MD;  Location: [facility] ENDOSCOPY;  Service: Endoscopy;  Laterality: N/A;   HAND SURGERY  2005     Current Outpatient Medications  Medication Sig Dispense Refill   amLODipine  (NORVASC ) 10 MG tablet TAKE 1 TABLET BY MOUTH EVERY DAY 90  tablet 1   aspirin  EC 81 MG tablet Take 81 mg by mouth daily.     busPIRone (BUSPAR) 5 MG tablet Take 5 mg by mouth 2 (two) times daily.     donepezil (ARICEPT) 10 MG tablet Take 1 tablet by mouth at bedtime.     gabapentin  (NEURONTIN ) 100 MG capsule TAKE 1 CAPSULE BY MOUTH THREE TIMES A DAY AS NEEDED 90 capsule 1   hydrALAZINE  (APRESOLINE ) 10 MG tablet TAKE 1 TABLET BY MOUTH THREE TIMES A DAY 270 tablet 1   hydrochlorothiazide  (HYDRODIURIL ) 12.5 MG tablet TAKE 1 TABLET BY MOUTH EVERY DAY 90 tablet 3   HYDROcodone -acetaminophen  (NORCO/VICODIN) 5-325 MG tablet Take 1 tablet by mouth every 4 (four) hours as needed. 15 tablet 0   Multiple Vitamins-Minerals (HAIR SKIN & NAILS PO) Take by mouth.     omeprazole  (PRILOSEC) 40 MG capsule TAKE 1 CAPSULE (40 MG TOTAL) BY MOUTH DAILY. 90 capsule 1   potassium chloride  SA (KLOR-CON  M) 20 MEQ tablet TAKE 1 TABLET BY MOUTH EVERY DAY 90 tablet 1   rosuvastatin  (CRESTOR ) 20 MG tablet TAKE 1 TABLET BY MOUTH EVERY DAY 90 tablet 2   clobetasol  cream (TEMOVATE ) 0.05 % Apply 1 Application topically 2 (two) times daily. Bid up to 4 weeks to aa rash on left upper arm and left lower leg, avoid face, groin, axilla, may thin skin with prolonged use (Patient not taking: Reported on 01/05/2025) 60 g 0   mupirocin   ointment (BACTROBAN ) 2 % Apply 1 Application topically 2 (two) times daily. Bid to rash on left lower leg (Patient not taking: Reported on 01/05/2025) 22 g 1   OVER THE COUNTER MEDICATION Doterra-EO Mega/AlphaCRS/MIcro Plex VM/Deep Blue Veg capsules (Patient not taking: Reported on 01/05/2025)     permethrin  (ELIMITE ) 5 % cream Thoroughly massage cream (using about half the tube) from head to soles of feet; leave on for 8 to 14 hours before removing (shower or bath); (Patient not taking: Reported on 01/05/2025) 60 g 1   triamcinolone  cream (KENALOG ) 0.1 % Apply 1 Application topically 2 (two) times daily. (Patient not taking: Reported on 01/05/2025) 30 g 0   No current  facility-administered medications for this visit.    Allergies:   Iodinated contrast media and Iodine    Social History:  The patient  reports that she has never smoked. She has never used smokeless tobacco. She reports that she does not currently use alcohol. She reports that she does not use drugs.   Family History:  The patient's family history includes Breast cancer in her cousin; Cancer in her paternal grandfather; Heart disease in her paternal grandmother; Stroke in her brother, mother, and sister.    ROS:  Please see the history of present illness.   Otherwise, review of systems are positive for none.   All other systems are reviewed and negative.    PHYSICAL EXAM: VS:  BP (!) 142/60 (BP Location: Left Arm, Patient Position: Sitting, Cuff Size: Normal)   Pulse 64   Ht 4' 10 (1.473 m)   Wt 146 lb 3.2 oz (66.3 kg)   SpO2 98%   BMI 30.56 kg/m  , BMI Body mass index is 30.56 kg/m. GEN: Well nourished, well developed, in no acute distress  HEENT: normal  Neck: no JVD, carotid bruits, or masses Cardiac: RRR; no  rubs, or gallops,no edema .  2/6 systolic murmur in the aortic area Respiratory:  clear to auscultation bilaterally, normal work of breathing GI: soft, nontender, nondistended, + BS MS: no deformity or atrophy  Skin: warm and dry, no rash Neuro:  Strength and sensation are intact Psych: euthymic mood, full affect Dorsalis pedis and posterior tibialis +2 bilaterally.   EKG:  EKG is ordered today. The ekg ordered today demonstrates : Normal sinus rhythm Left axis deviation Pulmonary disease pattern Incomplete right bundle branch block Minimal voltage criteria for LVH, may be normal variant ( R in aVL ) Nonspecific T wave abnormality When compared with ECG of 21-Nov-2024 09:59, Nonspecific T wave abnormality now evident in Lateral leads       Recent Labs: 03/30/2024: TSH 2.320 06/10/2024: Magnesium 2.4 08/10/2024: ALT 26 11/21/2024: BUN 10; Creatinine, Ser  0.73; Hemoglobin 14.5; Platelets 270; Potassium 4.3; Sodium 138    Lipid Panel    Component Value Date/Time   CHOL 147 02/24/2024 0805   TRIG 108 02/24/2024 0805   HDL 59 02/24/2024 0805   CHOLHDL 2.5 02/24/2024 0805   CHOLHDL 3.5 10/20/2022 0820   VLDL 17 10/20/2022 0820   LDLCALC 69 02/24/2024 0805      Wt Readings from Last 3 Encounters:  01/05/25 146 lb 3.2 oz (66.3 kg)  10/15/24 143 lb (64.9 kg)  10/05/24 145 lb (65.8 kg)          12/19/2023    3:02 PM  PAD Screen  Previous PAD dx? No  Previous surgical procedure? No  Pain with walking? Yes  Subsides with rest? Yes  Feet/toe relief with  dangling? No  Painful, non-healing ulcers? No  Extremities discolored? No      ASSESSMENT AND PLAN:  1.  Coronary artery disease involving native coronary arteries without angina: Coronary calcification noted on previous imaging.  Cardiac PET scan last year showed no evidence of ischemia.  Currently with no angina.  Recommend continuing medical therapy.    2.  Aortic atherosclerosis: She has no claudication and lower extremity pulses are normal.  3.  Essential hypertension: Blood pressure is reasonably controlled.  4.  Hyperlipidemia: She has known history of severe hyperlipidemia with previous LDL of 189.  Rosuvastatin  was increased and a follow-up lipid profile showed significant improvement with an LDL of 69.    Disposition:   FU with me in 12 months  Signed,  Deatrice Cage, MD  01/05/2025 1:39 PM    [facility] "

## 2025-01-06 ENCOUNTER — Ambulatory Visit: Payer: Self-pay | Admitting: Physician Assistant

## 2025-01-13 ENCOUNTER — Other Ambulatory Visit: Payer: Self-pay | Admitting: Physician Assistant

## 2025-01-13 DIAGNOSIS — G609 Hereditary and idiopathic neuropathy, unspecified: Secondary | ICD-10-CM

## 2025-01-14 ENCOUNTER — Encounter: Payer: Self-pay | Admitting: Physician Assistant

## 2025-01-14 ENCOUNTER — Ambulatory Visit (INDEPENDENT_AMBULATORY_CARE_PROVIDER_SITE_OTHER): Admitting: Physician Assistant

## 2025-01-14 VITALS — BP 121/60 | HR 74 | Temp 98.0°F | Resp 16 | Ht <= 58 in | Wt 146.0 lb

## 2025-01-14 DIAGNOSIS — I10 Essential (primary) hypertension: Secondary | ICD-10-CM | POA: Diagnosis not present

## 2025-01-14 DIAGNOSIS — R413 Other amnesia: Secondary | ICD-10-CM | POA: Diagnosis not present

## 2025-01-14 DIAGNOSIS — M810 Age-related osteoporosis without current pathological fracture: Secondary | ICD-10-CM | POA: Diagnosis not present

## 2025-01-14 MED ORDER — IBANDRONATE SODIUM 150 MG PO TABS: ORAL_TABLET | ORAL | 3 refills | Status: AC

## 2025-01-14 NOTE — Progress Notes (Signed)
 [facility] [address] [place], [place] [address]  Internal MEDICINE  Office Visit Note  Patient Name: Cynthia Keith  [id number]  [id number]  Date of Service: 01/14/2025  Chief Complaint  Patient presents with   Follow-up    Review Dexa Scan   Hyperlipidemia   Hypertension   Gastroesophageal Reflux    HPI Pt is here for routine follow up -BP stable -bone density showed osteoporosis -New puppy, Pepper -does admit forgetfulness again, but otherwise is doing well and states she feels blessed to still be working and be in overall good health -she continues to see clients at salon and thinks routine is good for her, keeps her active -followed  by neurology and will check when next appt is  Current Medication: Outpatient Encounter Medications as of 01/14/2025  Medication Sig   amLODipine  (NORVASC ) 10 MG tablet TAKE 1 TABLET BY MOUTH EVERY DAY   aspirin  EC 81 MG tablet Take 81 mg by mouth daily.   busPIRone (BUSPAR) 5 MG tablet Take 5 mg by mouth 2 (two) times daily.   clobetasol  cream (TEMOVATE ) 0.05 % Apply 1 Application topically 2 (two) times daily. Bid up to 4 weeks to aa rash on left upper arm and left lower leg, avoid face, groin, axilla, may thin skin with prolonged use   donepezil (ARICEPT) 10 MG tablet Take 1 tablet by mouth at bedtime.   gabapentin  (NEURONTIN ) 100 MG capsule TAKE 1 CAPSULE BY MOUTH THREE TIMES A DAY AS NEEDED   hydrALAZINE  (APRESOLINE ) 10 MG tablet TAKE 1 TABLET BY MOUTH THREE TIMES A DAY   hydrochlorothiazide  (HYDRODIURIL ) 12.5 MG tablet TAKE 1 TABLET BY MOUTH EVERY DAY   HYDROcodone -acetaminophen  (NORCO/VICODIN) 5-325 MG tablet Take 1 tablet by mouth every 4 (four) hours as needed.   ibandronate  (BONIVA ) 150 MG tablet Take in the morning with a full glass of water, on an empty stomach, and do not take anything else by mouth or lie down for the next 30 min.   Multiple Vitamins-Minerals (HAIR SKIN & NAILS PO) Take by mouth.   mupirocin   ointment (BACTROBAN ) 2 % Apply 1 Application topically 2 (two) times daily. Bid to rash on left lower leg   omeprazole  (PRILOSEC) 40 MG capsule TAKE 1 CAPSULE (40 MG TOTAL) BY MOUTH DAILY.   OVER THE COUNTER MEDICATION Doterra-EO Mega/AlphaCRS/MIcro Plex VM/Deep Blue Veg capsules   permethrin  (ELIMITE ) 5 % cream Thoroughly massage cream (using about half the tube) from head to soles of feet; leave on for 8 to 14 hours before removing (shower or bath);   potassium chloride  SA (KLOR-CON  M) 20 MEQ tablet TAKE 1 TABLET BY MOUTH EVERY DAY   rosuvastatin  (CRESTOR ) 20 MG tablet TAKE 1 TABLET BY MOUTH EVERY DAY   triamcinolone  cream (KENALOG ) 0.1 % Apply 1 Application topically 2 (two) times daily.   No facility-administered encounter medications on file as of 01/14/2025.    Surgical History: Past Surgical History:  Procedure Laterality Date   ABDOMINAL HYSTERECTOMY     COLONOSCOPY WITH PROPOFOL  N/A 12/12/2015   Procedure: COLONOSCOPY WITH PROPOFOL ;  Surgeon: Lamar ONEIDA Holmes, MD;  Location: [facility] ENDOSCOPY;  Service: Endoscopy;  Laterality: N/A;   HAND SURGERY  2005    Medical History: Past Medical History:  Diagnosis Date   Actinic keratosis 03/15/2021   L post thigh    Basal cell carcinoma 06/21/2021   L alar crease, EDC done 07/31/21   GERD (gastroesophageal reflux disease)    Hyperlipidemia    Hypertension  Malignant essential hypertension 02/26/2017   Plantar fasciitis    Sleep apnea    Urinary tract infection     Family History: Family History  Problem Relation Age of Onset   Breast cancer Cousin    Stroke Mother    Heart disease Paternal Grandmother    Cancer Paternal Grandfather    Stroke Sister    Stroke Brother     Social History   Socioeconomic History   Marital status: Married    Spouse name: Not on file   Number of children: Not on file   Years of education: Not on file   Highest education level: Not on file  Occupational History   Not on file  Tobacco Use    Smoking status: Never   Smokeless tobacco: Never  Vaping Use   Vaping status: Never Used  Substance and Sexual Activity   Alcohol use: Not Currently    Comment: ocassionally   Drug use: No   Sexual activity: Yes    Birth control/protection: Post-menopausal, Surgical  Other Topics Concern   Not on file  Social History Narrative   Not on file   Social Drivers of Health   Tobacco Use: Low Risk (01/14/2025)   Patient History    Smoking Tobacco Use: Never    Smokeless Tobacco Use: Never    Passive Exposure: Not on file  Financial Resource Strain: Not on file  Food Insecurity: No Food Insecurity (10/20/2022)   Hunger Vital Sign    Worried About Running Out of Food in the Last Year: Never true    Ran Out of Food in the Last Year: Never true  Transportation Needs: No Transportation Needs (10/20/2022)   PRAPARE - Administrator, Civil Service (Medical): No    Lack of Transportation (Non-Medical): No  Physical Activity: Not on file  Stress: Not on file  Social Connections: Not on file  Intimate Partner Violence: Not At Risk (10/20/2022)   Humiliation, Afraid, Rape, and Kick questionnaire    Fear of Current or Ex-Partner: No    Emotionally Abused: No    Physically Abused: No    Sexually Abused: No  Depression (PHQ2-9): Low Risk (01/14/2025)   Depression (PHQ2-9)    PHQ-2 Score: 0  Alcohol Screen: Low Risk (09/30/2023)   Alcohol Screen    Last Alcohol Screening Score (AUDIT): 1  Housing: Unknown (04/27/2024)   Received from [facility]   Epic    Unable to Pay for Housing in the Last Year: Not on file    Number of Times Moved in the Last Year: Not on file    At any time in the past 12 months, were you homeless or living in a shelter (including now)?: No  Utilities: Not At Risk (10/20/2022)   [facility] Utilities    Threatened with loss of utilities: No  Health Literacy: Not on file      Review of Systems  Constitutional:  Negative for chills and  unexpected weight change.  HENT:  Negative for congestion, postnasal drip, rhinorrhea, sneezing and sore throat.   Eyes:  Negative for redness.  Respiratory:  Negative for chest tightness, shortness of breath and wheezing.   Cardiovascular:  Negative for chest pain and palpitations.  Gastrointestinal:  Negative for abdominal pain, constipation, diarrhea, nausea and vomiting.  Genitourinary:  Negative for dysuria and frequency.  Musculoskeletal:  Negative for arthralgias, joint swelling and neck pain.  Skin:  Negative for rash.  Neurological: Negative.  Negative for tremors  and numbness.  Hematological:  Negative for adenopathy. Does not bruise/bleed easily.  Psychiatric/Behavioral:  Negative for behavioral problems (Depression), sleep disturbance and suicidal ideas. The patient is not nervous/anxious.        Memory loss    Vital Signs: BP 121/60   Pulse 74   Temp 98 F (36.7 C)   Resp 16   Ht 4' 10 (1.473 m)   Wt 146 lb (66.2 kg)   SpO2 96%   BMI 30.51 kg/m    Physical Exam Vitals and nursing note reviewed.  Constitutional:      Appearance: Normal appearance.  HENT:     Head: Normocephalic and atraumatic.  Eyes:     Extraocular Movements: Extraocular movements intact.  Cardiovascular:     Rate and Rhythm: Normal rate and regular rhythm.  Pulmonary:     Effort: Pulmonary effort is normal.     Breath sounds: Normal breath sounds.  Skin:    General: Skin is warm and dry.  Neurological:     Mental Status: She is alert and oriented to person, place, and time.     Gait: Gait normal.     Comments: Pt repeats herself in office and requires repeat instructions  Psychiatric:        Mood and Affect: Mood normal.        Behavior: Behavior normal.        Assessment/Plan: 1. Age-related osteoporosis without current pathological fracture (Primary) Will start Boniva , instructions written down for pt and will be on bottle as well. Advised to mark calendar/set reminder since  only taken monthly. Pt's husband also present for instructions and will notify pt's daughter as well. Advised on possible S/E and to call if any problems - ibandronate  (BONIVA ) 150 MG tablet; Take in the morning with a full glass of water, on an empty stomach, and do not take anything else by mouth or lie down for the next 30 min.  Dispense: 3 tablet; Refill: 3  2. Essential hypertension Well controlled, continue current medications  3. Memory loss Followed by neurology   General Counseling: Romero oakland understanding of the findings of todays visit and agrees with plan of treatment. I have discussed any further diagnostic evaluation that may be needed or ordered today. We also reviewed her medications today. she has been encouraged to call the office with any questions or concerns that should arise related to todays visit.    No orders of the defined types were placed in this encounter.   Meds ordered this encounter  Medications   ibandronate  (BONIVA ) 150 MG tablet    Sig: Take in the morning with a full glass of water, on an empty stomach, and do not take anything else by mouth or lie down for the next 30 min.    Dispense:  3 tablet    Refill:  3    This patient was seen by Tinnie Pro, PA-C in collaboration with Dr. Sigrid Bathe as a part of collaborative care agreement.   Total time spent:30 Minutes Time spent includes review of chart, medications, test results, and follow up plan with the patient.      Dr Fozia M Khan Internal medicine

## 2025-05-10 ENCOUNTER — Ambulatory Visit: Admitting: Physician Assistant

## 2025-10-07 ENCOUNTER — Ambulatory Visit: Admitting: Physician Assistant

## 2025-10-25 ENCOUNTER — Encounter: Admitting: Dermatology
